# Patient Record
Sex: Male | Born: 1935 | Race: White | Hispanic: No | Smoking: Former smoker
Health system: Southern US, Community
[De-identification: ages and names within clinical notes are randomized; demographics above are authoritative.]

## PROBLEM LIST (undated history)

## (undated) DIAGNOSIS — D126 Benign neoplasm of colon, unspecified: Secondary | ICD-10-CM

## (undated) DIAGNOSIS — N200 Calculus of kidney: Secondary | ICD-10-CM

## (undated) DIAGNOSIS — D649 Anemia, unspecified: Secondary | ICD-10-CM

## (undated) DIAGNOSIS — R0602 Shortness of breath: Secondary | ICD-10-CM

## (undated) DIAGNOSIS — K8050 Calculus of bile duct without cholangitis or cholecystitis without obstruction: Secondary | ICD-10-CM

## (undated) DIAGNOSIS — I219 Acute myocardial infarction, unspecified: Secondary | ICD-10-CM

## (undated) DIAGNOSIS — E785 Hyperlipidemia, unspecified: Secondary | ICD-10-CM

## (undated) DIAGNOSIS — K222 Esophageal obstruction: Secondary | ICD-10-CM

## (undated) DIAGNOSIS — K219 Gastro-esophageal reflux disease without esophagitis: Secondary | ICD-10-CM

## (undated) DIAGNOSIS — I2510 Atherosclerotic heart disease of native coronary artery without angina pectoris: Secondary | ICD-10-CM

## (undated) DIAGNOSIS — Z5189 Encounter for other specified aftercare: Secondary | ICD-10-CM

## (undated) DIAGNOSIS — M810 Age-related osteoporosis without current pathological fracture: Secondary | ICD-10-CM

## (undated) DIAGNOSIS — K648 Other hemorrhoids: Secondary | ICD-10-CM

## (undated) DIAGNOSIS — F32A Depression, unspecified: Secondary | ICD-10-CM

## (undated) DIAGNOSIS — J189 Pneumonia, unspecified organism: Secondary | ICD-10-CM

## (undated) DIAGNOSIS — I10 Essential (primary) hypertension: Secondary | ICD-10-CM

## (undated) DIAGNOSIS — M1990 Unspecified osteoarthritis, unspecified site: Secondary | ICD-10-CM

## (undated) DIAGNOSIS — K279 Peptic ulcer, site unspecified, unspecified as acute or chronic, without hemorrhage or perforation: Secondary | ICD-10-CM

## (undated) DIAGNOSIS — F329 Major depressive disorder, single episode, unspecified: Secondary | ICD-10-CM

## (undated) DIAGNOSIS — K5730 Diverticulosis of large intestine without perforation or abscess without bleeding: Secondary | ICD-10-CM

## (undated) DIAGNOSIS — J329 Chronic sinusitis, unspecified: Secondary | ICD-10-CM

## (undated) DIAGNOSIS — I214 Non-ST elevation (NSTEMI) myocardial infarction: Secondary | ICD-10-CM

## (undated) HISTORY — DX: Diverticulosis of large intestine without perforation or abscess without bleeding: K57.30

## (undated) HISTORY — DX: Benign neoplasm of colon, unspecified: D12.6

## (undated) HISTORY — DX: Other hemorrhoids: K64.8

## (undated) HISTORY — DX: Calculus of bile duct without cholangitis or cholecystitis without obstruction: K80.50

## (undated) HISTORY — PX: DOPPLER ECHOCARDIOGRAPHY: SHX263

## (undated) HISTORY — DX: Depression, unspecified: F32.A

## (undated) HISTORY — DX: Chronic sinusitis, unspecified: J32.9

## (undated) HISTORY — DX: Hyperlipidemia, unspecified: E78.5

## (undated) HISTORY — DX: Peptic ulcer, site unspecified, unspecified as acute or chronic, without hemorrhage or perforation: K27.9

## (undated) HISTORY — DX: Anemia, unspecified: D64.9

## (undated) HISTORY — DX: Gastro-esophageal reflux disease without esophagitis: K21.9

## (undated) HISTORY — DX: Major depressive disorder, single episode, unspecified: F32.9

## (undated) HISTORY — DX: Encounter for other specified aftercare: Z51.89

## (undated) HISTORY — PX: OTHER SURGICAL HISTORY: SHX169

## (undated) HISTORY — PX: CATARACT EXTRACTION W/ INTRAOCULAR LENS  IMPLANT, BILATERAL: SHX1307

## (undated) HISTORY — DX: Esophageal obstruction: K22.2

## (undated) HISTORY — PX: UPPER GASTROINTESTINAL ENDOSCOPY: SHX188

## (undated) HISTORY — DX: Age-related osteoporosis without current pathological fracture: M81.0

---

## 1995-04-25 HISTORY — PX: CORONARY ANGIOPLASTY WITH STENT PLACEMENT: SHX49

## 1995-09-23 DIAGNOSIS — I219 Acute myocardial infarction, unspecified: Secondary | ICD-10-CM

## 1995-09-23 HISTORY — DX: Acute myocardial infarction, unspecified: I21.9

## 1998-05-18 ENCOUNTER — Encounter: Payer: Self-pay | Admitting: Emergency Medicine

## 1998-05-18 ENCOUNTER — Emergency Department (HOSPITAL_COMMUNITY): Admission: EM | Admit: 1998-05-18 | Discharge: 1998-05-18 | Payer: Self-pay | Admitting: Emergency Medicine

## 1999-10-16 ENCOUNTER — Emergency Department (HOSPITAL_COMMUNITY): Admission: EM | Admit: 1999-10-16 | Discharge: 1999-10-16 | Payer: Self-pay | Admitting: *Deleted

## 2004-09-12 ENCOUNTER — Ambulatory Visit: Payer: Self-pay | Admitting: Internal Medicine

## 2004-10-10 ENCOUNTER — Emergency Department (HOSPITAL_COMMUNITY): Admission: EM | Admit: 2004-10-10 | Discharge: 2004-10-10 | Payer: Self-pay | Admitting: Emergency Medicine

## 2004-10-12 ENCOUNTER — Encounter (INDEPENDENT_AMBULATORY_CARE_PROVIDER_SITE_OTHER): Payer: Self-pay | Admitting: Specialist

## 2004-10-12 ENCOUNTER — Ambulatory Visit: Payer: Self-pay | Admitting: Internal Medicine

## 2005-01-31 ENCOUNTER — Encounter: Payer: Self-pay | Admitting: Emergency Medicine

## 2005-02-01 ENCOUNTER — Inpatient Hospital Stay (HOSPITAL_COMMUNITY): Admission: AD | Admit: 2005-02-01 | Discharge: 2005-02-02 | Payer: Self-pay | Admitting: Obstetrics & Gynecology

## 2005-02-01 ENCOUNTER — Ambulatory Visit: Payer: Self-pay | Admitting: Gastroenterology

## 2005-02-03 ENCOUNTER — Ambulatory Visit: Payer: Self-pay | Admitting: Gastroenterology

## 2005-06-03 ENCOUNTER — Inpatient Hospital Stay (HOSPITAL_COMMUNITY): Admission: EM | Admit: 2005-06-03 | Discharge: 2005-06-04 | Payer: Self-pay | Admitting: *Deleted

## 2005-07-27 ENCOUNTER — Ambulatory Visit: Payer: Self-pay | Admitting: Internal Medicine

## 2005-08-02 ENCOUNTER — Ambulatory Visit: Payer: Self-pay | Admitting: Internal Medicine

## 2005-08-28 ENCOUNTER — Ambulatory Visit: Payer: Self-pay | Admitting: Internal Medicine

## 2006-03-02 DIAGNOSIS — I214 Non-ST elevation (NSTEMI) myocardial infarction: Secondary | ICD-10-CM

## 2006-03-02 HISTORY — DX: Non-ST elevation (NSTEMI) myocardial infarction: I21.4

## 2006-04-03 ENCOUNTER — Emergency Department (HOSPITAL_COMMUNITY): Admission: EM | Admit: 2006-04-03 | Discharge: 2006-04-03 | Payer: Self-pay | Admitting: Emergency Medicine

## 2006-12-03 ENCOUNTER — Emergency Department (HOSPITAL_COMMUNITY): Admission: EM | Admit: 2006-12-03 | Discharge: 2006-12-04 | Payer: Self-pay | Admitting: Emergency Medicine

## 2007-01-08 ENCOUNTER — Ambulatory Visit: Payer: Self-pay | Admitting: Internal Medicine

## 2007-07-12 DIAGNOSIS — Z9861 Coronary angioplasty status: Secondary | ICD-10-CM

## 2007-07-12 DIAGNOSIS — J301 Allergic rhinitis due to pollen: Secondary | ICD-10-CM

## 2007-07-12 DIAGNOSIS — K222 Esophageal obstruction: Secondary | ICD-10-CM

## 2007-07-12 DIAGNOSIS — K8050 Calculus of bile duct without cholangitis or cholecystitis without obstruction: Secondary | ICD-10-CM | POA: Insufficient documentation

## 2007-07-12 DIAGNOSIS — I2510 Atherosclerotic heart disease of native coronary artery without angina pectoris: Secondary | ICD-10-CM

## 2007-07-12 DIAGNOSIS — K648 Other hemorrhoids: Secondary | ICD-10-CM | POA: Insufficient documentation

## 2007-07-12 DIAGNOSIS — K5730 Diverticulosis of large intestine without perforation or abscess without bleeding: Secondary | ICD-10-CM | POA: Insufficient documentation

## 2007-07-12 DIAGNOSIS — J329 Chronic sinusitis, unspecified: Secondary | ICD-10-CM | POA: Insufficient documentation

## 2007-07-12 DIAGNOSIS — K219 Gastro-esophageal reflux disease without esophagitis: Secondary | ICD-10-CM | POA: Insufficient documentation

## 2007-07-12 DIAGNOSIS — E785 Hyperlipidemia, unspecified: Secondary | ICD-10-CM | POA: Insufficient documentation

## 2007-07-12 DIAGNOSIS — K208 Other esophagitis without bleeding: Secondary | ICD-10-CM | POA: Insufficient documentation

## 2007-07-12 DIAGNOSIS — K279 Peptic ulcer, site unspecified, unspecified as acute or chronic, without hemorrhage or perforation: Secondary | ICD-10-CM | POA: Insufficient documentation

## 2007-07-12 DIAGNOSIS — D126 Benign neoplasm of colon, unspecified: Secondary | ICD-10-CM | POA: Insufficient documentation

## 2009-04-24 HISTORY — PX: OTHER SURGICAL HISTORY: SHX169

## 2009-10-12 ENCOUNTER — Encounter (INDEPENDENT_AMBULATORY_CARE_PROVIDER_SITE_OTHER): Payer: Self-pay | Admitting: *Deleted

## 2010-05-15 ENCOUNTER — Encounter: Payer: Self-pay | Admitting: *Deleted

## 2010-05-26 NOTE — Letter (Signed)
Summary: Colonoscopy Letter  [facility]  [address] [place], [place] [address]   Phone: [phone]  Fax: [phone]      October 12, 2009 MRN: [id number]   [facility] [address] [place], [place]  [address]   Dear Mr. Torian,   According to your medical record, it is time for you to schedule a Colonoscopy. The American Cancer Society recommends this procedure as a method to detect early colon cancer. Patients with a family history of colon cancer, or a personal history of colon polyps or inflammatory bowel disease are at increased risk.  This letter has been generated based on the recommendations made at the time of your procedure. If you feel that in your particular situation this may no longer apply, please contact our office.  Please call our office at [phone] to schedule this appointment or to update your records at your earliest convenience.  Thank you for cooperating with [place] to provide you with the very best care possible.   Sincerely,  Wilhemina Bonito. Marina Goodell, M.D.  [facility] [phone]

## 2010-09-06 NOTE — Assessment & Plan Note (Signed)
[facility]                         GASTROENTEROLOGY OFFICE NOTE   NAME:PASCHALLZade, Falkner                     MRN:          [id number]  DATE:01/08/2007                            DOB:          1933/05/15    HISTORY:  Mr. Aguilera is a 77 year old gentleman with a history of  coronary artery disease, hyperlipidemia, peptic ulcer disease,  gastroesophageal reflux disease complicated by erosive esophagitis,  choledocholithiasis, status post ERCP with sphincterotomy and stone  extraction.  The patient presents today with ongoing chronic abdominal  complaints and requests medication refill.   He was last evaluated in the office on Aug 28, 2005, with intermittent  epigastric pain, felt secondary to gallbladder disease.  Against medical  advice, the patient has not sought surgical attention (i.e. a  cholecystectomy), for what is felt to be symptomatic gallbladder  disease.  He has been strictly advised on multiple occasions.  He has  also been advised as to the potential consequences of ignoring this  recommendation.  In any event, for his reflux disease, he has been on  Protonix.  He is currently on no medication, due to cost concerns.  He  denies dysphagia.  Finally, he continues with intermittent epigastric  pain which generally bothers him at 4 or 5 a.m.  For this he will take  sublingual Levsin.  Over time, he states this is helpful.  He has no  problems with vomiting, melena or hematochezia.  His last colonoscopy in  2006, revealed diminutive polyps and diverticulosis.   ALLERGIES:  NIASPAN.   CURRENT MEDICATIONS:  1. Metoprolol 100 mg daily.  2. Hydrochlorothiazide 12.5 mg daily.  3. Avapro 150 mg twice daily.  4. Vytorin 10/20 mg daily.  5. Aspirin 81 mg daily.  6. Fish oil one twice daily.   PHYSICAL EXAMINATION:  GENERAL:  An elderly male, in no acute distress.  VITAL SIGNS:  Blood pressure 130/78, heart rate 52 and regular, weight  207  pounds (increased 10 pounds).  HEENT:  Sclerae anicteric.  Conjunctivae pink.  Oral mucosa is intact.  NECK:  There is no adenopathy.  LUNGS:  Clear.  HEART:  Regular.  ABDOMEN:  Soft without tenderness, mass or hernia.  Good bowel sounds  heard.  EXTREMITIES:  Without edema.   IMPRESSION:  1. Gastroesophageal reflux disease.  Patient with symptoms off of      proton pump inhibitors.  2. History of choledocholithiasis, status post endoscopic retrograde      cholangiopancreatography with sphincterotomy.  Intact gallbladder,      as noted above.  3. Intermittent abdominal pain, felt likely due to gallbladder      disease.  The patient does take Levsin, which he feels helps.  4. History of colon polyps and diverticulosis on colonoscopy in 2006.  5. Multiple general medical problems, under the care of Dr. Bayard Beaver.      Spear.   RECOMMENDATIONS:  1. Prilosec over-the-counter 20 mg daily.  Multiple samples have been      provided.  The patient should find this a more cost-effective      alternative.  2. Levsin  sublingual p.r.n.  3. Again recommended to consult with surgery regarding a      cholecystectomy for biliary abdominal pain. He acknowledged such.  4. Surveillance colonoscopy planned for 2111.  5. Ongoing general medical care with Dr. Collins Scotland.     Wilhemina Bonito. Marina Goodell, MD  Electronically Signed    JNP/MedQ  DD: 01/08/2007  DT: 01/08/2007  Job #: [id number]   cc:   [name] R. Collins Scotland, M.D.  Nanetta Batty, M.D.

## 2010-09-09 NOTE — Discharge Summary (Signed)
NAME:  [facility], Kevin Rhodes              ACCOUNT NO.:  [id number]   MEDICAL RECORD NO.:  [id number]          PATIENT TYPE:  INP   LOCATION:  5506                         FACILITY:  [facility]   PHYSICIAN:  Mobolaji B. Bakare, M.D.DATE OF BIRTH:  Jul 13, 1933   DATE OF ADMISSION:  06/03/2005  DATE OF DISCHARGE:  06/04/2005                                 DISCHARGE SUMMARY   PRIMARY CARE PHYSICIAN:  Dr. Earlene Plater, [facility].   FINAL DIAGNOSES:  1.  Gastroenteritis.  2.  Hyperglycemia.  3.  Hyperbilirubinemia with admission bilirubin of 2.0. This improved to 1.6      at discharge. Normal liver enzymes.   SECONDARY DIAGNOSES:  1.  Acute renal failure with thrombocytopenia in October 2006.  2.  Coronary artery disease, status post percutaneous transluminal coronary      angioplasty and stent in 1997.  3.  Colon polyps, diverticulitis, and hemorrhoids by colonoscopy in June      2006.  4.  Esophagitis, jejunitis, and esophageal stricture on upper endoscopy in      June 2006.  5.  Hypertension.  6.  Hyperlipidemia.  7.  History of cholangitis, choledocholithiasis, status post ERCP and      removal of common bile duct stone in October 2006.   PROCEDURE:  CT scan of the abdomen and pelvis showed multiple small  nonobstructive right renal calculi, bilateral renal cysts, cardiomegaly,  mild bibasilar atelectasis, gastric distention which may be related to  gastroenteritis, small amount of biliary air compatible with history of  sphincterectomy.   BRIEF HISTORY:  Please refer to the admission H&P for full details. [name] is a 77 year old male African American male with multiple medical  problems as stated above. He presented to the emergency room with abdominal  pain, nausea, and vomiting, which started a day prior to admission. He was  feeling weak. He had no fever. CT scan of the abdomen is as noted above.  Thus, he needed to be admitted to hydration and resolution of  symptoms.   HOSPITAL COURSE:  Gastroenteritis. [facility] was started on IV fluids, IV  Phenergan p.r.n., and Dilaudid for pain. Abdominal CT scan did not show any  acute abnormality. He had a stool culture which was negative, there was no  growth. C. difficile was negative. Ova and parasite not present. Urine  culture was no growth. During the course of hospitalization he remained  afebrile. Symptoms resolved in 36 hours. Hence, the patient was deemed fit  to be discharged home.   DISCHARGE CONDITION:  Stable.   DISCHARGE MEDICATIONS:  1.  Vytorin 10/20 p.o. daily.  2.  Hydrochlorothiazide 25 mg daily.  3.  Metoprolol 100 m g daily.  4.  Avapro 150 mg b.i.d.  5.  Protonix 40 mg daily.   FOLLOWUP:  The patient will follow up with primary care physician.      Mobolaji B. Corky Downs, M.D.  Electronically Signed     MBB/MEDQ  D:  06/16/2005  T:  06/18/2005  Job:  [id number]   cc:   Dr. Earlene Plater  [facility]

## 2010-09-09 NOTE — Discharge Summary (Signed)
[name], DUPIN NO.:  [id number]   MEDICAL RECORD NO.:  [id number]          PATIENT TYPE:  INP   LOCATION:  5511                         FACILITY:  [facility]   PHYSICIAN:  Ilda Mori, M.D.   DATE OF BIRTH:  [birth year]   DATE OF ADMISSION:  02/01/2005  DATE OF DISCHARGE:  02/02/2005                                 DISCHARGE SUMMARY   NOTE:  Please send a copy of the discharge summary which was dictated,  #[id number], on Marcoantonio L. Putt to Lebron Conners, M.D.      Jennye Moccasin, P.A. [facility]      Ilda Mori, M.D.  Electronically Signed    SG/MEDQ  D:  02/02/2005  T:  02/03/2005  Job:  [id number]

## 2010-09-09 NOTE — Consult Note (Signed)
NAMECHE, RACHAL NO.:  [id number]   MEDICAL RECORD NO.:  [id number]          PATIENT TYPE:  INP   LOCATION:  5511                         FACILITY:  [facility]   PHYSICIAN:  Lebron Conners, M.D.   DATE OF BIRTH:  [birth year]   DATE OF CONSULTATION:  02/01/2005  DATE OF DISCHARGE:                                   CONSULTATION   REASON FOR CONSULTATION:  Cholangitis, needs cholecystectomy.   HISTORY OF PRESENT ILLNESS:  Mr. Crossen is a 77 year old male patient who  has been sick over the past 5-6 days complaining of nausea, vomiting, and  mild periumbilical pain.  He presented to the emergency room on February 01, 2005, and was noted to have elevated liver function tests as well as total  bilirubin at 3.7.  Blood cultures were obtained and are currently not final.  Initially one blood culture was obtained in the emergency room that shows  some gram negative rods, but since that time, two blood cultures have been  drawn and they so far have not shown any growth.  Urinalysis showed some  bilirubin as well as trace leukocytes, with proteinuria.  His white blood  cell count is normal, although he has been running a fever as high as 103.   A gallbladder ultrasound showed a stone, 9 mm, lodged in the gallbladder  neck, but no evidence of cholecystitis.  The patient then underwent ERCP  today that did show ductal dilatation from the common bile duct to the  common hepatic duct.  There was a round, nonmobile stone in the common  hepatic duct, 12 mm, that was extracted without difficulty.  The patient has  undergone ERCP without difficulty.  He is in the holding area now.  We have  been consulted for cholecystectomy in the near future.   PAST MEDICAL HISTORY:  1.  Coronary artery disease, apparently had a stent placed in 1997, but no      problems in the past approximately 10 years.  2.  History of hypertension.  3.  Hypercholesterolemia.  4.  GERD.   SOCIAL  HISTORY:  Married, lives in [place].   ALLERGIES:  No known drug allergies.   MEDICATIONS:  IV Cipro, hydrochlorothiazide, Avapro, metoprolol, Flagyl, and  Protonix.   FAMILY HISTORY:  Unknown.  There is no family present and indications of  family history in his e-chart.   PHYSICAL EXAMINATION:  VITAL SIGNS:  T-max 102.8, pulse 66, respirations 20,  blood pressure 93/59, O2 saturations 95% on room air.  HEENT:  Normal.  No nasal discharge.  NECK:  There is no thyromegaly.  CHEST:  Clear to auscultation bilaterally.  HEART:  Regular rate and rhythm.  No gross murmur.  ABDOMEN:  Obese.  Right upper quadrant is tender but abdomen is nonrigid.  EXTREMITIES:  No peripheral edema.  SKIN:  Warm and dry.  NEUROLOGIC:  Intact.   IMPRESSION:  1.  Cholangitis.  Will keep patient n.p.o. after midnight for tentative      cholecystectomy tomorrow.  We will recheck LFTs in the morning.  The  patient has been seen and examined by Dr. Orson Slick and will reevaluate the      patient in the morning to decide upon surgical timing.  2.  Hypertension.  3.  Known coronary artery disease.  4.  Hypercholesterolemia.  5.  Gastroesophageal reflux disease.      Guy Franco, P.A.      ______________________________  Lebron Conners, M.D.    LB/MEDQ  D:  02/01/2005  T:  02/01/2005  Job:  [id number]

## 2010-09-09 NOTE — Discharge Summary (Signed)
NAME:  Kevin Rhodes, Kevin Rhodes              ACCOUNT NO.:  [id number]   MEDICAL RECORD NO.:  [id number]          PATIENT TYPE:  INP   LOCATION:  5511                         FACILITY:  [facility]   PHYSICIAN:  Kevin [facility], M.D. Kevin Rhodes OF BIRTH:  August 12, 1933   DATE OF ADMISSION:  02/01/2005  DATE OF DISCHARGE:  02/02/2005                                 DISCHARGE SUMMARY   ADMISSION DIAGNOSES:  1.  Cholelithiasis, probable cholangitis, rule out choledocholithiasis.  2.  Coronary artery disease.  Stent placed in 1997.  3.  Thrombocytopenia.  4.  Hypokalemia.  5.  Renal insufficiency.  6.  Hyperglycemia.  7.  History of hypertension.  8.  History of dyslipidemia.  9.  Remote peptic ulcer disease when he was in his 3s.  10. Colon polyps, diverticulosis and hemorrhoids on colonoscopy of June 2006      by [facility]. [facility], M.D.  Pathology of the polyps not known at this time      and not available in E-chart.  11. Esophagitis, duodenitis, esophageal stricture consistent with      gastroesophageal reflux disease as well as antral polyp which was      biopsied by [facility] in June 2006.  Again, pathology not available      on E-chart.  12. No known drug allergies.   DISCHARGE DIAGNOSES:  1.  Choledocholithiasis.  2.  Status post endoscopic retrograde cholangiopancreatography with removal      of common bile duct stone.  3.  Jaundice secondary to choledocholithiasis, improved following endoscopic      retrograde cholangiopancreatography.  4.  Cholangitis.  Did have fevers during this admission, treated with      antibiotics and will go home on oral antibiotics.  Gram-negative rods      seen on Gram stain.  Final identification of flora still pending at the      time of this dictation.  5.  Cholelithiasis.  The patient has opted for postponement of laparoscopic      cholecystectomy, and this will be pursued in the next couple of weeks by      [facility].  6.  Hypokalemia, corrected.  7.   Hyperglycemia.  Needs to be monitored for emergence of diabetes mellitus      type 2.  8.  Acute renal insufficiency with prerenal azotemia secondary to acute      illness involving depletion.  This resolved during hospitalization.   PROCEDURES:  Endoscopic retrograde cholangiopancreatography with  sphincterotomy and removal of large stone in the common bile duct.  Diffuse  common bile duct dilatation noted.   CONSULTATIONS:  General surgical evaluation by [facility], M.D.   BRIEF HISTORY:  [name] is a pleasant, generally healthy 77 year old  gentleman.  He is still quite active.  He was known to [facility] because of  having undergone recent screening colonoscopy and endoscopy in June of this  year.  His primary [facility] doctor is at [facility].  Several days  prior to presenting for this admission, he developed nausea and vomiting.  This lasted Friday through Sunday.  It subsided and he was left with  anorexia and weakness.  He was not having any abdominal pain but did have  some chills.  He went to the urgent [facility] and they thought he might be  in the throes of a viral gastrointestinal illness.  The patient says he was  given a B12 shot and also received prescriptions for dicyclomine and  promethazine.  He went home but in the next several hours he still felt very  unwell and got to the point where he could not even walk without assistance.  He went to the emergency room at [facility], where he ultimately was seen  by Dr. Melvia Rhodes.  His pulse was tachycardic at 106, blood pressure  103/68, temperature was 103.  The ultrasound of the abdomen showed a stone  in the neck of the gallbladder and a prominent bile duct at 8 mm diameter.  [facility] was concerned that the patient may have cholangitis and admitted  him and started him on Cipro and IV fluids.  The patient's transaminases  were elevated.   LABORATORY DATA:  Initial total bilirubin 3.7.  It was  2.6 at discharge.  Alkaline phosphatase 168 initial, 135 at discharge.  AST 113 initial, 65 at  discharge.  ALT 11 initial, 76 at discharge.  White blood cell count was  7.5.  Hemoglobin went from 13.3 to 11.9.  Hematocrit was 34.6 at discharge.  MCV 85.3.  Platelets initially 118, 121 at discharge.  PT 16.5, INR 1.3, PTT  34.  Sodium 137, potassium 4.3, glucose 11, chloride 107, CO2 of 19.  BUN  went from 29 to 19.  Creatinine went from 1.7 to 1.4.  Albumin 3.  Gram  stain did show gram-negative rods.  Final ID is pending.  This was on one of  four blood cultures drawn.   HOSPITAL COURSE:  The patient was admitted, hydrated and started on IV  fluids.  He was kept n.p.o. initially.  On October 11 he underwent an ERCP  with removal of the common bile duct stone.  Before that procedure the  patient spiked temperatures as high as 102.8.  At that point IV Flagyl was  added to the IV ciprofloxacin.  He defervesced for the rest of the afternoon  of October 11 and through the morning of October 12.  However, just prior to  discharge he did have a temperature reading of 100.1.  It was felt that he  would be okay for discharge because he did have a prescription for  ciprofloxacin and clinically was doing quite well.  He was advised to get a  thermometer and take his temperature whenever he felt that he was feverish  and to take it at least once a day.  If he had persistent fevers, he was to  call [facility] office.   The patient was seen by [facility] for evaluation for laparoscopic  cholecystectomy.  This was set up for the morning of October 12.  However,  on second consideration the patient requested of [facility] that the surgery  be postponed.  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  [facility] felt that this would not be a problem.   The patient was in stable condition and  eating a low-fat diet on the day  following his ERCP.  His LFTs were rechecked  that morning, and they were all  declining nicely.  His renal insufficiency had resolved.  He was discharged  to home with a prescription for 10 days' worth of ciprofloxacin.  He was to  continue all his outpatient medications with the exception of the 81 mg of  aspirin.  This was not to be restarted until after [facility] said it would  be okay to do so.  We also asked the patient to hold his hydrochlorothiazide  for the next few days and restart it on February 05, 2005, in order to assure  that his kidneys were well-hydrated before restarting this medications.   CONDITION ON DISCHARGE:  Stable.   DIET AT DISCHARGE:  Low-fat.   MEDICATIONS AT DISCHARGE:  1.  Vytorin 10/20 mg once daily.  2.  Hydrochlorothiazide 25 mg once daily to restart on October 15.  3.  Metoprolol 100 mg once daily.  4.  Avapro 150 mg twice daily.  5.  Protonix 40 mg once daily.  6.  Ciprofloxacin 500 mg one p.o. twice daily for 10 days.      Jennye Moccasin, P.A. [facility]      [facility], M.D. [facility]  Electronically Signed    SG/MEDQ  D:  02/02/2005  T:  02/03/2005  Job:  [id number]   cc:   [facility]. [facility], M.D. [facility]  [address]  [place]  [place] [address]

## 2010-09-09 NOTE — H&P (Signed)
NAME:  Kevin Rhodes, SOULE NO.:  [id number]   MEDICAL RECORD NO.:  [id number]          PATIENT TYPE:  INP   LOCATION:  1824                         FACILITY:  [facility]   PHYSICIAN:  Elliot Cousin, M.D.    DATE OF BIRTH:  12/11/1933   DATE OF ADMISSION:  06/03/2005  DATE OF DISCHARGE:                                HISTORY & PHYSICAL   CHIEF COMPLAINT:  Abdominal pain, nausea and vomiting.   HISTORY OF PRESENT ILLNESS:  The patient is a 77 year old man with a past  medical history significant for choledocholithiasis, cholangitis, status  post ERCP with removal of a common bile duct stone in October 2006, who  presents to the emergency department with a chief complaint of abdominal  pain, nausea and vomiting.  The patient's symptoms started approximately 18  hours ago.  The abdominal pain is described as achy.  The pain has been  mild to moderate in intensity.  After he vomits, the pain subsides.  The  pain does not radiate to the groin or to the back.  It is located primarily  below his umbilicus.  He has had no associated diarrhea.  His last bowel  movement was last night.  No recent melena or hematochezia.  He has had at  least 7-8 episodes of vomiting over the past 18 hours.  He describes the  emesis as sometimes green in color and sometimes brown in color.  No coffee  ground consistency.  No bright red blood in the emesis.  He has had chills  once but no fever.  No rash.  No yellowing of the eyes or skin.  No pain  with urination.  Last night, prior to his symptoms, he ate pie with whipped  cream, baked potato, chocolate ice cream, and peanut butter crackers.  Neither his wife nor his daughter ate the same foods last night.  Neither  his wife nor his daughter are sick.  The patient was recently treated with  Meclizine prescribed by his primary care physician for dizziness two days  ago.  The patient has not been treated with any antibiotics recently.  No  recent  travel.   During the evaluation in the emergency department, the patient is noted to  be afebrile and tachycardic.  His blood pressure is 149/98.  His oxygen  saturations are well within normal limits.  Lab data are significant for an  elevated white blood cell count of 12.3, an elevated glucose of 174, an  elevated total bilirubin of 2, and a normal lipase of 39.  The patient will  be admitted for further evaluation and management.   PAST MEDICAL HISTORY:  1.  Choledocholithiasis with cholangitis in October 2006 status post ERCP      with removal of a common bile duct stone.  2.  Postponement of laparoscopic cholecystectomy.  The patient was scheduled      to follow up with surgeon, Dr. Lebron Conners, in November 2006.      However, the patient did not follow up because his symptoms completely      resolved.  3.  Hypokalemia, acute renal insufficiency, and thrombocytopenia during the      hospitalization in October 2006.  All corrected prior to hospital      discharge.  4.  Coronary artery disease status post PTCA and stent in 1997.  5.  Colon polyps, diverticulosis, and hemorrhoids, per colonoscopy in June      2006 by Dr. Yancey Flemings.  Pathology of the polyps unknown at this time.  6.  Esophagitis, duodenitis, esophageal stricture consistent with      gastroesophageal reflux disease as well as an antral polyp which was      biopsied by [name] in June 2006 per EGD.  Pathology report not      available.  7.  Hypertension.  8.  Hyperlipidemia.   MEDICATIONS:  1.  Vytorin 10/20 mg daily.  2.  Hydrochlorothiazide 25 mg, 1/2 tablet daily.  3.  Protonix 40 mg daily.  4.  Metoprolol 100 mg daily.  5.  Meclizine 25 mg 1/2 tablet t.i.d. p.r.n.   ALLERGIES:  No known drug allergies.   SOCIAL HISTORY:  The patient is married, he [place]  He has three children.  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  He  denies tobacco,  alcohol, and illicit drug use.  He is fairly independent.   FAMILY HISTORY:  His mother died of old age at 63 years of age.  He does not  know his father.   REVIEW OF SYMPTOMS:  The patient's review of systems is negative with the  exception of occasional bilateral leg pain.   PHYSICAL EXAMINATION:  VITAL SIGNS:  Temperature 98.7, blood pressure 149/98, pulse 130, repeated  at 111, respiratory rate 20, oxygen saturation 94% on room air.  GENERAL:  The patient is a pleasant, alert, mildly overweight, 77 year old  Caucasian man who is currently lying in bed in no acute distress.  HEENT:  Head is normocephalic, atraumatic.  Pupils equal, round, reactive to  light.  Extraocular movements intact.  Conjunctivae clear.  Sclerae white.  Tympanic membrane on the left is obscured by cerumen, tympanic membrane on  the right is clear.  Nasal mucosa is moist, no sinus tenderness.  Oropharynx  reveals mildly dry mucous membranes.  No posterior exudates or erythema.  NECK:  Supple, no adenopathy, no thyromegaly, no JVD, no bruits.  LUNGS:  Clear to auscultation bilaterally.  HEART:  S1 and S2 with mild tachycardia.  ABDOMEN:  Obese, positive bowel sounds, soft, mildly tender in the  hypogastric region, no distention, no rebound, no guarding, no masses  palpated.  RECTAL AND GU:  Deferred.  EXTREMITIES:  Pedal pulses are 2+ bilaterally.  No pretibial edema and no  pedal edema.  NEUROLOGICAL:  The patient is alert and oriented x 3.  Cranial nerves 2-12  are intact.  Strength 5/5 throughout.  Sensation is intact throughout.  Gait  is within normal limits.   ADMISSION LABORATORY DATA:  EKG reveals sinus tachycardia with occasional  PVC, Q waves in the lateral and inferior leads, unchanged compared with  previous EKG with the exception of tachycardia.  Lipase 39.  WBC 12.3,  hemoglobin 13.5, hematocrit 41.5, MCV 81.8, platelets 187, myoglobin 177. CK MB 1.8, troponin I less than  0.05.  Sodium 141, potassium 4.2, chloride  11, CO2 23, glucose 174, BUN 19, creatinine 1.5, calcium 9, total protein  7.3, albumin 3.9, AST 39, ALT 26, alkaline phos 78, total  bilirubin 2.   ASSESSMENT:  1.  Abdominal pain with nausea and vomiting.  The differential diagnoses      include biliary colic, UTI, acute cholecystitis, viral gastroenteritis,      peptic ulcer disease, esophagitis, diverticulitis, etc.  2.  Leukocytosis.  The patient's white blood cell count is mildly elevated      at 12.3.  The elevation could be an active component of the nausea,      vomiting, and abdominal pain.  The patient is currently afebrile and      other than a possible viral gastroenteritis, UTI, and/or cholecystitis,      no other infection is evident.  3.  History of choledocholithiasis and cholangitis.  The patient is status      post ERCP in October 2006.  Given that he is afebrile and with no acute      right upper quadrant abdominal pain, I doubt that the patient has      recurrent cholangitis.  He does have hyperbilirubinemia with a bilirubin      of 2, however.  4.  Hyperglycemia.  The patient gives no history of diabetes mellitus.  His      venous glucose is 174.  5.  Recent dizziness started on Meclizine by his primary care physician.      The patient denies spinning dizziness, rather, he has had intermittent      light headedness.  No complaints of dizziness today.  6.  Coronary artery disease.  He denies chest pain.  He has been without      cardiac symptoms for sometime now.  7.  Hypertension.  The patient is chronically treated with      hydrochlorothiazide and Metoprolol.  His blood pressure is mildly      elevated.   PLAN:  1.  The patient will be admitted for further evaluation and management.  2.  Will assess the patient with a CT scan of the abdomen and pelvis with      contrast.  3.  Insert NG tube if needed. The patient wants to avoid the NG, but this      may not be  possible.  4.  The patient will be made n.p.o.  Will start Reglan 5 mg IV q.6h. and      p.r.n. Phenergan and Zofran. Protonix will be given IV.  5.  Volume repletion with normal saline.  The patient was given 1 liter of      normal saline by the emergency department physician.  Will add potassium      chloride and an amp of multi-vitamin to each liter of IV fluid.  6.  Will check a hemoglobin A1C and cover anticipated elevated blood sugars      with a sliding scale insulin regimen.  Dextrose will be added to the IV      fluids.  7.  Will check stool specimens for C. diff, O&P, routine gram stain, and      C&S.  The patient, however, does not have diarrhea at this time.  8.  Will check an urinalysis to rule out infection.  9.  Will treat the patient's hypertension with intravenous Lopressor 5 mg IV      q.4h.  10. Consider consultation with gastroenterologist, [name], and surgeon,      Dr. Orson Slick.      Elliot Cousin, M.D.  Electronically Signed    DF/MEDQ  D:  06/03/2005  T:  06/03/2005  Job:  [id number]   cc:   Wilhemina Bonito. Marina Goodell, M.D. [facility]  [address]  [place]  [place] [address]   Lebron Conners, M.D.  [address]  [place]  [place] [address]

## 2010-09-09 NOTE — H&P (Signed)
NAMEHUSAM, HOHN              ACCOUNT NO.:  [id number]   MEDICAL RECORD NO.:  [id number]          PATIENT TYPE:  INP   LOCATION:  5511                         FACILITY:  [facility]   PHYSICIAN:  Barbette Hair. Arlyce Dice, M.D. [facility] OF BIRTH:  December 18, 1933   DATE OF ADMISSION:  02/01/2005  DATE OF DISCHARGE:                                HISTORY & PHYSICAL   CHIEF COMPLAINT:  Anorexia and weakness with nausea and vomiting a few days  ago.   HISTORY OF PRESENT ILLNESS:  Mr. Koy is a pleasant 77 year old  gentleman who does not have any disabling medical problems.  Last Friday,  the patient developed severe nausea and vomiting, along with some suprapubic  pain.  This nausea and vomiting lasted Friday through "Sunday.  It subsided,  and since then he has been left with anorexia and progressive weakness.  He  denies right upper quadrant pain.  He has had some mild chills, but has not  taken his temperature.  He got so weak that he has not been able to walk  without assistance.  He went to [facility], his primary care  Jaysen Wey.  His primary care Lyndel Sarate prescribed dicyclomine and  promethazine.  He also gave him a vitamin B-12 shot.  He felt that the  patient may have some sore of a viral gastrointestinal illness.  However,  the patient's symptoms failed to improve over the next several hours, and,  therefore, he presented himself to the emergency room at [place].  There, he was evaluated by the emergency room physician, and then by Dr.  Robert Kaplan.  Temperature initial in the emergency room was 103 with a  blood pressure of 103/68 and tachycardic pulse at 106.  Saturations were  adequate in the mid 90s.  Labs revealed a cholestatic jaundice.  His  transaminases were elevated.  White cell count was not elevated.  Potassium  was low.  An ultrasound of the abdomen showed a stone in the neck of the  gallbladder.  The common bile duct was prominent at 8 mm diameter.  Dr.  Kaplan was concerned that the patient had cholangitis.  The patient was  admitted and started on intravenous ciprofloxacin, along with analgesics and  anti-emetics as needed.  He was also continued on IV fluid hydration.  Also  note that his BUN and creatinine were slightly elevated.   PAST MEDICAL HISTORY:  1.  Coronary artery disease, for which he has undergone stent placement in      19" 97.  He has not had any angina or required any subsequent      catheterizations since then.  2.  Hypertension.  3.  Dyslipidemia.  4.  Remote peptic ulcer disease during his 82s.  5.  History of colon polyps, diverticulosis, and hemorrhoids on colonoscopy      in June of 2006 by Dr. Yancey Flemings.  Pathology on the polyps is not      available at this time.  6.  Esophagitis, duodenitis, esophageal stricture consistent with      gastroesophageal reflux disease on upper endoscopy in June of  2006.      Also, an antral polyp was biopsied at that time.  Again, pathology is      not available at this time.   ALLERGIES:  None.  Specifically, no medication allergies and no latex  allergies.   CURRENT MEDICATIONS:  1.  Aspirin 81 mg daily.  2.  Vytorin 10/20 mg once daily.  3.  Hydrochlorothiazide 25 mg once daily.  4.  Metoprolol 100 mg once daily.  5.  Avapro 150 mg twice daily.  6.  Protonix 40 mg daily.   SOCIAL HISTORY:  The patient lives in [place].  He lives with his  spouse.  He does not drink alcohol or smoke or chew tobacco.   FAMILY HISTORY:  Noncontributory.   REVIEW OF SYSTEMS:  The patient has been urinating well.  He has not noticed  any darkening of his urine.  He is thirsty, but has not had excessive  thirst.  Some chills, but no documented fevers at home.  No unusual bleeding  or bruising.  No extremity edema or chest pain.  No palpitations.  No cough.  Some fatigue with walking, and this is something of a shortness of breath,  along with the fatigue.   PHYSICAL EXAMINATION:   VITAL SIGNS:  Blood pressure following some IV fluids  was 99/62, pulse 73, respirations 18, and temperature 98.  Initially, when  he came to the emergency room, temperature was 103, blood pressure 103/68,  pulse of 106, and respirations of 20.  Saturations were between 95% and 97%.  HEENT:  Sclerae are nonicteric.  Conjunctivae are pink.  Extraocular  movements intact.  Oropharynx is moist and clear.  NECK:  No JVD, no masses, no thyromegaly.  CHEST:  Clear to auscultation and percussion bilaterally with good breath  sounds.  No cough, no dyspnea with speech.  COR:  Regular rate and rhythm.  No murmurs, rubs, or gallops.  ABDOMEN:  Soft, nontender.  Bowel sounds are active.  No hepatosplenomegaly,  no masses, no bruits, no hernias.  RECTAL/GU:  Deferred.  EXTREMITIES:  No clubbing, cyanosis, or edema.  MUSCULOSKELETAL:  No atrophy of the upper or lower extremities.  HEMATOLOGIC:  No significant purpura or bruising.  DERMATOLOGIC:  No rash.  No worrisome lesions on the trunk or extremities.  PSYCHIATRIC:  The patient is pleasant and appropriate.  He is not anxiety.  NEUROLOGIC:  The patient is alert and oriented x3.  He provides an excellent  history with no signs of any memory impairment.  Moves all 4 extremities  without difficulty.  Neurologic exam is grossly nonfocal.   LABORATORY DATA:  Total bilirubin 3.6, alkaline phosphatase 167, AST 146,  ALT 121, albumin 3.2.  Sodium 130, potassium 3.0, BUN 28, creatinine 1.8,  glucose 155.  White blood cell count 7.5, hemoglobin 13.3, hematocrit 39.4,  platelets 118,000, MCV of 85.   IMPRESSION:  1.  Cholelithiasis with probably cholangitis.  Range of motion      choledocholithiasis.  2.  Coronary artery disease with stent placement in 1997 and no indications      that he has any unstable coronary disease at this point.  3.  Thrombocytopenia.  4.  Hypokalemia.  5.  Renal insufficiency.  6.  Hyperglycemia.  PLAN:  The patient is the  continue on the antibiotic of Cipro begun in the  emergency room.  Supportive care with analgesics and anti-emetics.  Continue  IV fluid hydration.  Plan ERCP investigation on February 01, 2005.  Will also  plan to discontinue hydrochlorothiazide because of the renal insufficiency.  We may be able to restart this later on.  Will follow up BMET and LFT's on a  daily basis, along with a CBC.      Jennye Moccasin, P.A. [facility]      Robert D. Arlyce Dice, M.D. [facility]  Electronically Signed    SG/MEDQ  D:  02/01/2005  T:  02/01/2005  Job:  [id number]

## 2011-01-06 ENCOUNTER — Telehealth: Payer: Self-pay | Admitting: *Deleted

## 2011-01-11 ENCOUNTER — Other Ambulatory Visit: Payer: Self-pay | Admitting: Cardiovascular Disease

## 2011-01-11 ENCOUNTER — Ambulatory Visit
Admission: RE | Admit: 2011-01-11 | Discharge: 2011-01-11 | Disposition: A | Discharge status: Home / Self Care | Payer: Medicare Other | Source: Ambulatory Visit | Attending: Cardiovascular Disease | Admitting: Cardiovascular Disease

## 2011-01-11 DIAGNOSIS — R0602 Shortness of breath: Secondary | ICD-10-CM

## 2011-01-17 ENCOUNTER — Telehealth: Payer: Self-pay | Admitting: Internal Medicine

## 2011-01-17 NOTE — Telephone Encounter (Signed)
Pt last seen for an OV 01/08/07, history of GERD. Colon and EGD done in 2006. Has recall colon scheduled for 02/16/11.  Pts daughter states that her father was supposed to have a cardiac cath last week with Dr. Allyson Sabal but his Hgb was 8.8 and was to low. Pt had CBC redrawn yesterday and his Hgb was 9.3, he is scheduled for cardiac cath tomorrow. Daughter is calling wanting to see if [name] thinks it is ok for him to have the cardiac cath. [facility] requesting labs from yesterday and some information. No calls have come over regarding this pt.

## 2011-01-17 NOTE — Telephone Encounter (Signed)
Please pull his chart for my review

## 2011-01-17 NOTE — Telephone Encounter (Signed)
Spoke with [facility] cardiology, they are going to call the pts daughter and let her know per Dr. Allyson Sabal it is ok to proceed with the cath. [facility] just wanted [place] to know about his labs because he has a colon scheduled with [name] 02/16/11.

## 2011-01-17 NOTE — Telephone Encounter (Signed)
Per [name] pt to proceed with his cath and then have an OV here. Pt scheduled to see [name] 02/01/11@8 :30am. Daughter aware of appt date and time. Daughter to bring records from Dr. Hazle Coca office.

## 2011-01-18 ENCOUNTER — Inpatient Hospital Stay (HOSPITAL_COMMUNITY)
Admission: RE | Admit: 2011-01-18 | Discharge: 2011-01-20 | DRG: 247 | Disposition: A | Discharge status: Home / Self Care | Payer: Medicare Other | Source: Ambulatory Visit | Attending: Cardiovascular Disease | Admitting: Cardiovascular Disease

## 2011-01-18 DIAGNOSIS — I200 Unstable angina: Secondary | ICD-10-CM | POA: Diagnosis present

## 2011-01-18 DIAGNOSIS — K219 Gastro-esophageal reflux disease without esophagitis: Secondary | ICD-10-CM | POA: Diagnosis present

## 2011-01-18 DIAGNOSIS — I2510 Atherosclerotic heart disease of native coronary artery without angina pectoris: Principal | ICD-10-CM | POA: Diagnosis present

## 2011-01-18 DIAGNOSIS — K2970 Gastritis, unspecified, without bleeding: Secondary | ICD-10-CM | POA: Diagnosis present

## 2011-01-18 DIAGNOSIS — D649 Anemia, unspecified: Secondary | ICD-10-CM | POA: Diagnosis present

## 2011-01-18 DIAGNOSIS — Z9861 Coronary angioplasty status: Secondary | ICD-10-CM

## 2011-01-18 DIAGNOSIS — E119 Type 2 diabetes mellitus without complications: Secondary | ICD-10-CM | POA: Diagnosis present

## 2011-01-18 DIAGNOSIS — E785 Hyperlipidemia, unspecified: Secondary | ICD-10-CM | POA: Diagnosis present

## 2011-01-18 DIAGNOSIS — Z79899 Other long term (current) drug therapy: Secondary | ICD-10-CM

## 2011-01-18 DIAGNOSIS — I10 Essential (primary) hypertension: Secondary | ICD-10-CM | POA: Diagnosis present

## 2011-01-18 DIAGNOSIS — Z7982 Long term (current) use of aspirin: Secondary | ICD-10-CM

## 2011-01-18 DIAGNOSIS — I252 Old myocardial infarction: Secondary | ICD-10-CM

## 2011-01-18 LAB — GLUCOSE, CAPILLARY

## 2011-01-18 LAB — POCT ACTIVATED CLOTTING TIME: Activated Clotting Time: 287 seconds

## 2011-01-18 NOTE — Telephone Encounter (Signed)
Not sure what this note if about.  Nothing in documentation.

## 2011-01-19 LAB — BASIC METABOLIC PANEL
Calcium: 8.7 mg/dL (ref 8.4–10.5)
GFR calc Af Amer: >60 mL/min (ref >60)
GFR calc non Af Amer: 60 mL/min — ABNORMAL LOW (ref >60)
Glucose, Bld: 148 mg/dL — ABNORMAL HIGH (ref 70–99)
Potassium: 4.7 mEq/L (ref 3.5–5.1)
Sodium: 138 mEq/L (ref 135–145)

## 2011-01-19 LAB — CBC
Hemoglobin: 8.9 g/dL — ABNORMAL LOW (ref 13.0–17.0)
RBC: 3.97 MIL/uL — ABNORMAL LOW (ref 4.22–5.81)

## 2011-01-19 LAB — GLUCOSE, CAPILLARY
Glucose-Capillary: 134 mg/dL — ABNORMAL HIGH (ref 70–99)
Glucose-Capillary: 119 mg/dL — ABNORMAL HIGH (ref 70–99)
Glucose-Capillary: 119 mg/dL — ABNORMAL HIGH (ref 70–99)
Glucose-Capillary: 241 mg/dL — ABNORMAL HIGH (ref 70–99)

## 2011-01-20 HISTORY — PX: CORONARY ANGIOPLASTY WITH STENT PLACEMENT: SHX49

## 2011-01-20 LAB — CBC
HCT: 28.3 % — ABNORMAL LOW (ref 39.0–52.0)
Hemoglobin: 8.4 g/dL — ABNORMAL LOW (ref 13.0–17.0)
MCH: 22.3 pg — ABNORMAL LOW (ref 26.0–34.0)
MCHC: 29.7 g/dL — ABNORMAL LOW (ref 30.0–36.0)
MCV: 75.3 fL — ABNORMAL LOW (ref 78.0–100.0)
Platelets: 159 10*3/uL (ref 150–400)
RBC: 3.76 MIL/uL — ABNORMAL LOW (ref 4.22–5.81)
RDW: 18 % — ABNORMAL HIGH (ref 11.5–15.5)
WBC: 6.3 10*3/uL (ref 4.0–10.5)

## 2011-01-20 LAB — BASIC METABOLIC PANEL
BUN: 22 mg/dL (ref 6–23)
Calcium: 8.4 mg/dL (ref 8.4–10.5)
Creatinine, Ser: 1.11 mg/dL (ref 0.50–1.35)
GFR calc Af Amer: >60 mL/min (ref >60)
GFR calc non Af Amer: >60 mL/min (ref >60)
Potassium: 5 mEq/L (ref 3.5–5.1)
Sodium: 137 mEq/L (ref 135–145)

## 2011-01-20 LAB — GLUCOSE, CAPILLARY: Glucose-Capillary: 152 mg/dL — ABNORMAL HIGH (ref 70–99)

## 2011-01-22 NOTE — Discharge Summary (Signed)
  Kevin Rhodes, Kevin Rhodes NO.:  [id number]  MEDICAL RECORD NO.:  [id number]  LOCATION:  2508                         FACILITY:  [facility]  PHYSICIAN:  Nanetta Batty, M.D.   DATE OF BIRTH:  [birth year]  DATE OF ADMISSION:  01/18/2011 DATE OF DISCHARGE:  01/20/2011                              DISCHARGE SUMMARY   DISCHARGE DIAGNOSES: 1. Unstable angina. 2. Coronary disease, right coronary artery drug-eluting stent     placement, January 18, 2011 with left anterior descending     percutaneous transluminal angioplasty, January 19, 2011. 3. Mild left ventricular dysfunction with an ejection fraction of 45-     50% at catheterization. 4. Treated hypertension. 5. Treated dyslipidemia. 6. Anemia, hemoglobin is 8.4 at discharge, the patient was noted to be     anemic with a hemoglobin of 9 prior to admission. 7. Type 2 non-insulin-dependent diabetes. 8. History of gastritis and gastroesophageal reflux, followed by Dr.     Marina Goodell.  HOSPITAL COURSE:  Kevin Rhodes is a 77 year old male followed by Dr. Allyson Sabal.  He has a past history of coronary disease and had a inferior wall infarction in 1997 treated with PCI.  He had a functional study in November 2011 that showed scar but no ischemia.  He presented recently with unstable angina on January 06, 2011 to the office.  Amlodipine 5 mg a day and Imdur were added.  He was set up for diagnostic catheterization.  The patient stopped taking the Imdur because of headache, he apparently was not sure which medicine was causing the headaches, so we stopped his Norvasc as well.  The patient was admitted on January 18, 2011 and underwent diagnostic catheterization which revealed an 80% RCA that was dilated and stented with a Promus stent. He had an 80-90% LAD stenosis as well.  His EF was 45-50%.  He underwent staged intervention to the LAD, although it does not appear that he received a stent to the site.  Post procedure, he did  well.  His hemoglobin was 8.4 at discharge.  He denies any melena.  Dr. Allyson Sabal feels he can be discharged and follow up with [name].  He has an appointment in 2 weeks.  We will check a CBC next week and see him in the office in 1-2 weeks.  Please see med rec for complete discharge medications.  LABORATORY DATA:  White count 6.3, hemoglobin 8.4, hematocrit 28.4, and platelets 159.  Sodium 137, potassium 5.0, BUN 22, and creatinine 1.11.  DISPOSITION:  The patient discharged in stable condition and will follow up with Dr. Nanetta Batty and Dr. Herb Grays.     Abelino Derrick, P.A.   ______________________________ Nanetta Batty, M.D.    [name]  D:  01/20/2011  T:  01/20/2011  Job:  [id number]  cc:   [name] R. Collins [place], M.D.  Electronically Signed by Corine Shelter P.A. on 01/21/2011 06:08:07 PM Electronically Signed by Nanetta Batty M.D. on 01/22/2011 11:51:56 AM

## 2011-01-22 NOTE — Cardiovascular Report (Signed)
  NAME:  Kevin Rhodes, Kevin Rhodes NO.:  [id number]  MEDICAL RECORD NO.:  [id number]  LOCATION:  2508                         FACILITY:  [facility]  PHYSICIAN:  Nanetta Batty, M.D.   DATE OF BIRTH:  08-30-1933  DATE OF PROCEDURE: DATE OF DISCHARGE:                           CARDIAC CATHETERIZATION   HISTORY OF PRESENT ILLNESS:  Kevin Rhodes is a 77 year old married white male father of 3, grandfather of 7 grandchildren who has a history of an inferior wall myocardial infarction treated with PCI on October 15, 1995. He had moderate LAD and circumflex disease as well as ramus branch disease at that time.  His other problems include hypertension and hyperlipidemia.  He was admitted with new-onset exertional angina for diagnostic cath.  Yesterday, was found to have high-grade mid right and proximal LAD stenosis.  He underwent staged RCA stenting yesterday and presents today for proximal LAD PCI stenting.  DESCRIPTION OF PROCEDURE:  The patient was brought to the [facility] in the postabsorptive state.  He was premedicated with p.o. Valium, IV Versed and fentanyl.  His right groin was prepped and shaved in the usual sterile fashion.  Xylocaine 1% was used for local anesthesia.  A 6-French sheath was inserted into the right femoral artery using standard Seldinger technique.  A 5-French JR- 4 catheter was used for selective right coronary artery angiography.  A 6-French XB LAD 4.0 guide catheter was used for a proximal LAD intervention.  Visipaque dye was used for the entirety of the case. Retrograde aortic pressures were monitored during the case.  The patient received Angiomax bolus with an ACT of 309.  Total contrast used during the case was 135 mL.  Using an 0.14 x 190 Asahi Prowater wire along with a 2.5 x 10 AngioSculpt cutting balloon atherectomy was performed in the proximal LAD lesion.  Following this, a 3.0 x 18 Medtronic Resolute  drug-eluting stent was then deployed at 16 atmospheres and postdilated with a 3.25 x 15 [place] Sprinter at 18 atmospheres (3.44 mm) resulting reduction of 95% proximal LAD stenosis to 0% residual with excellent flow and no dissection.  The patient tolerated the procedure well without hemodynamic  electrocardiographic Sequelae.  IMPRESSION:  Successful proximal left anterior descending cutting balloon atherectomy, percutaneous coronary intervention and stenting using drug-eluting stent and Angiomax.  The patient was on aspirin and Effient.  Guidewire and catheter removed.  The sheath was sewn securely in place.  The patient left the lab in stable condition.  He will be gently hydrated overnight, discharged home in the morning.     Nanetta Batty, M.D.     JB/MEDQ  D:  01/19/2011  T:  01/20/2011  Job:  [id number]  cc:   [facility] [name] R. Collins Scotland, M.D.  Electronically Signed by Nanetta Batty M.D. on 01/22/2011 11:51:51 AM

## 2011-01-22 NOTE — Cardiovascular Report (Signed)
NAME:  Kevin Rhodes, Kevin Rhodes NO.:  [id number]  MEDICAL RECORD NO.:  [id number]  LOCATION:                                 FACILITY:  PHYSICIAN:  Kevin Rhodes, M.D.   DATE OF BIRTH:  [birth year]  DATE OF PROCEDURE:  01/18/2011 DATE OF DISCHARGE:                           CARDIAC CATHETERIZATION   Kevin Rhodes is a 77 year old married Caucasian male, father of 3, grandfather of 7 grandchildren, with history of CAD, status post inferior wall myocardial infarction, treated with PCI, October 15, 1995. He had moderate LAD, circumflex, and ramus branch disease as well at that time.  His last functional study performed in November 2011 showed inferior scar without ischemia.  His other problems include hypertension, hyperlipidemia.  He has had new-onset reproducible exertional chest pain and presents now for angiography potential percutaneous intervention.  PROCEDURE DESCRIPTION:  The patient was brought to Second Floor [place] cardiac cath lab in the postabsorptive state.  Premedicated with p.o. Valium, IV Versed and fentanyl.  His right groin was prepped and shaved in usual sterile fashion.  1% Xylocaine was used for local anesthesia.  A 5-French sheath was inserted into the right femoral artery using standard Seldinger technique.  A 5-French left Judkins diagnostic catheter as well as 5-French pigtail catheter were used for selective cholangiography, left ventriculography, subselective left internal mammary artery angiography, and distal abdominal aortography. Visipaque dye was used for entirety of the case.  Retrograde aortic, left ventricular, and pullback pressures were recorded.  HEMODYNAMICS: 1. Aortic systolic pressure 165, diastolic pressure 83. 2. Left ventricle systolic pressure 168, end-diastolic pressure 23.  SELECTIVE CORONARY ANGIOGRAPHY: 1. Left main normal. 2. LAD; the LAD had an 80%-90% proximal stenosis. 3. Circumflex; nondominant, the most 30-40%  ostial stenosis. 4. Ramus branch; small to medium size and free of systemic disease. 5. Right coronary artery; dominant with a segmental 50% to 60%     proximal to mid stenosis and fairly focal 75% stenosis in the mid     midportion. 6. Left ventriculography; RAO left ventriculogram was performed using     25 mL of Visipaque dye at 12 mL per second.  The overall LVEF was     estimated at approximate 40%-45% with moderate inferobasal     hypokinesia. 7. Left internal mammary artery;  this vessel was subselectively     visualized and was widely patent.  Suitable for use during coronary     artery bypass grafting if required. 8. Distal abdominal aortography; distal abdominal aortogram was     performed using 20 mL of Visipaque dye at 20 mL per second.  The     renal arteries are not well visualized.  Infrarenal abdominal aorta     and iliac bifurcation appear free of significant atherosclerotic     changes.  IMPRESSION:  Kevin Rhodes has two-vessel disease with mild-to-moderate LV dysfunction and exertional angina.  Our plan is to fix his dominant RCA with drug-eluting stent, Effient, and Angiomax and stage his proximal LAD lesion.  DESCRIPTION OF PROCEDURE:  The existing 5-French sheath was exchanged over wire for a 6-French sheath.  The patient did received four baby aspirin today and received 60 mg  of Effient, Angiomax bolus with an ACT of approximately 280.  Total of approximately 200 mL of contrast was administered to the patient.  Using a 6-French JR-4 guide catheter along with an 0.014 x 190 Asahi soft wire and a 2 x 12 Emerge balloon, predilatation was performed at nominal pressures.  Following this, stenting was performed with a 2.75 x 16 Promus Element drug-eluting stent at 14 atmospheres.  Postdilatation was performed with a 3.25 x 12 [place] Sprinter at 14 atmospheres (3.3 millimeters) performing overlapping inflations.  The patient tolerated the procedure well without  hemodynamic or electrocardiographic sequelae. The guidewire and catheter removed.  Sheath was sewn securely in place. The patient left the lab in stable condition.  IMPRESSION:  Successful PCI stenting of a dominant RCA with Promus Element drug-eluting stent.  Plans will be to continue aspirin and Effient, gently hydrate overnight, and perform staged proximal LAD and PCI stenting tomorrow.  The patient left the lab in stable condition.     Kevin Rhodes, M.D.     JB/MEDQ  D:  01/18/2011  T:  01/18/2011  Job:  [id number]  cc:   [facility] [place] [facility] [name] R. Collins Scotland, M.D.  Electronically Signed by Kevin Rhodes M.D. on 01/22/2011 11:51:47 AM

## 2011-01-24 ENCOUNTER — Inpatient Hospital Stay (HOSPITAL_COMMUNITY)
Admission: EM | Admit: 2011-01-24 | Discharge: 2011-01-27 | DRG: 379 | Disposition: A | Discharge status: Home / Self Care | Payer: Medicare Other | Source: Ambulatory Visit | Attending: Cardiovascular Disease | Admitting: Cardiovascular Disease

## 2011-01-24 ENCOUNTER — Telehealth: Payer: Self-pay | Admitting: Internal Medicine

## 2011-01-24 DIAGNOSIS — I2510 Atherosclerotic heart disease of native coronary artery without angina pectoris: Secondary | ICD-10-CM | POA: Diagnosis present

## 2011-01-24 DIAGNOSIS — E785 Hyperlipidemia, unspecified: Secondary | ICD-10-CM | POA: Diagnosis present

## 2011-01-24 DIAGNOSIS — K254 Chronic or unspecified gastric ulcer with hemorrhage: Principal | ICD-10-CM | POA: Diagnosis present

## 2011-01-24 DIAGNOSIS — Z79899 Other long term (current) drug therapy: Secondary | ICD-10-CM

## 2011-01-24 DIAGNOSIS — Z7982 Long term (current) use of aspirin: Secondary | ICD-10-CM

## 2011-01-24 DIAGNOSIS — K219 Gastro-esophageal reflux disease without esophagitis: Secondary | ICD-10-CM | POA: Diagnosis present

## 2011-01-24 DIAGNOSIS — K222 Esophageal obstruction: Secondary | ICD-10-CM | POA: Diagnosis present

## 2011-01-24 DIAGNOSIS — Z7902 Long term (current) use of antithrombotics/antiplatelets: Secondary | ICD-10-CM

## 2011-01-24 DIAGNOSIS — I252 Old myocardial infarction: Secondary | ICD-10-CM

## 2011-01-24 DIAGNOSIS — D500 Iron deficiency anemia secondary to blood loss (chronic): Secondary | ICD-10-CM | POA: Diagnosis present

## 2011-01-24 DIAGNOSIS — I10 Essential (primary) hypertension: Secondary | ICD-10-CM | POA: Diagnosis present

## 2011-01-24 DIAGNOSIS — Z9861 Coronary angioplasty status: Secondary | ICD-10-CM

## 2011-01-24 LAB — DIFFERENTIAL
Eosinophils Absolute: 0.5 10*3/uL (ref 0.0–0.7)
Eosinophils Relative: 6 % — ABNORMAL HIGH (ref 0–5)
Lymphs Abs: 1.9 10*3/uL (ref 0.7–4.0)
Monocytes Absolute: 0.6 10*3/uL (ref 0.1–1.0)
Monocytes Relative: 7 % (ref 3–12)

## 2011-01-24 LAB — CBC
MCH: 22.6 pg — ABNORMAL LOW (ref 26.0–34.0)
MCHC: 30.1 g/dL (ref 30.0–36.0)
MCV: 75.1 fL — ABNORMAL LOW (ref 78.0–100.0)
Platelets: 212 10*3/uL (ref 150–400)
RDW: 18.1 % — ABNORMAL HIGH (ref 11.5–15.5)

## 2011-01-24 LAB — POCT I-STAT, CHEM 8
BUN: 37 mg/dL — ABNORMAL HIGH (ref 6–23)
Calcium, Ion: 1.15 mmol/L (ref 1.12–1.32)
Chloride: 110 mEq/L (ref 96–112)
Glucose, Bld: 249 mg/dL — ABNORMAL HIGH (ref 70–99)
TCO2: 18 mmol/L (ref 0–100)

## 2011-01-24 LAB — ABO/RH: ABO/RH(D): A POS

## 2011-01-24 NOTE — Telephone Encounter (Signed)
Pt scheduled to see Willette Cluster NP 01/25/11@9 :30am. Dr. Hazle Coca office to notify pt of appt date and time.

## 2011-01-25 ENCOUNTER — Ambulatory Visit: Payer: Medicare Other | Admitting: Nurse Practitioner

## 2011-01-25 DIAGNOSIS — R195 Other fecal abnormalities: Secondary | ICD-10-CM

## 2011-01-25 DIAGNOSIS — K219 Gastro-esophageal reflux disease without esophagitis: Secondary | ICD-10-CM

## 2011-01-25 DIAGNOSIS — D509 Iron deficiency anemia, unspecified: Secondary | ICD-10-CM

## 2011-01-25 DIAGNOSIS — Z86010 Personal history of colonic polyps: Secondary | ICD-10-CM

## 2011-01-25 LAB — CBC
Hemoglobin: 9.3 g/dL — ABNORMAL LOW (ref 13.0–17.0)
MCH: 23.8 pg — ABNORMAL LOW (ref 26.0–34.0)
MCHC: 31.3 g/dL (ref 30.0–36.0)
MCHC: 31.4 g/dL (ref 30.0–36.0)
Platelets: 159 10*3/uL (ref 150–400)
RBC: 3.44 MIL/uL — ABNORMAL LOW (ref 4.22–5.81)
RDW: 17.5 % — ABNORMAL HIGH (ref 11.5–15.5)

## 2011-01-25 LAB — GLUCOSE, CAPILLARY
Glucose-Capillary: 149 mg/dL — ABNORMAL HIGH (ref 70–99)
Glucose-Capillary: 217 mg/dL — ABNORMAL HIGH (ref 70–99)

## 2011-01-26 DIAGNOSIS — Z1211 Encounter for screening for malignant neoplasm of colon: Secondary | ICD-10-CM

## 2011-01-26 DIAGNOSIS — K222 Esophageal obstruction: Secondary | ICD-10-CM

## 2011-01-26 DIAGNOSIS — K250 Acute gastric ulcer with hemorrhage: Secondary | ICD-10-CM

## 2011-01-26 HISTORY — PX: COLONOSCOPY: SHX174

## 2011-01-26 HISTORY — PX: ESOPHAGOGASTRODUODENOSCOPY: SHX1529

## 2011-01-26 LAB — CBC
MCHC: 31.6 g/dL (ref 30.0–36.0)
Platelets: 196 10*3/uL (ref 150–400)
RDW: 17.6 % — ABNORMAL HIGH (ref 11.5–15.5)

## 2011-01-26 LAB — CROSSMATCH
ABO/RH(D): A POS
Unit division: 0
Unit division: 0

## 2011-01-26 LAB — GLUCOSE, CAPILLARY
Glucose-Capillary: 226 mg/dL — ABNORMAL HIGH (ref 70–99)
Glucose-Capillary: 151 mg/dL — ABNORMAL HIGH (ref 70–99)

## 2011-01-27 LAB — CBC
HCT: 28.1 % — ABNORMAL LOW (ref 39.0–52.0)
Hemoglobin: 8.9 g/dL — ABNORMAL LOW (ref 13.0–17.0)
MCH: 24.5 pg — ABNORMAL LOW (ref 26.0–34.0)
MCHC: 31.7 g/dL (ref 30.0–36.0)

## 2011-01-30 NOTE — Discharge Summary (Signed)
NAMECOREE, BRAME NO.:  [id number]  MEDICAL RECORD NO.:  [id number]  LOCATION:  3030                         FACILITY:  [facility]  PHYSICIAN:  Thurmon Fair, MD     DATE OF BIRTH:  11-16-1933  DATE OF ADMISSION:  01/24/2011 DATE OF DISCHARGE:  01/27/2011                              DISCHARGE SUMMARY   DISCHARGE DIAGNOSES: 1. Gastrointestinal bleed status post colonoscopy and EGD.  Multiple     ulcers in the antrum.  Normal duodenum.  Esophageal stricture     noted. 2. Unstable angina on January 18, 2011. 3. Coronary artery disease status post drug-eluting stent to the right     coronary artery on January 18, 2011, with left anterior     descending artery balloon angioplasty on January 19, 2011. 4. Mild left ventricular dysfunction, ejection fraction of 45-50%. 5. Anemia secondary to the gastrointestinal bleed. 6. Diabetes mellitus type 2. 7. Hypertension. 8. Dyslipidemia. 9. History of peptic ulcer disease.  HOSPITAL COURSE:  Kevin Rhodes is a 77 year old Caucasian male with a history of unstable angina, coronary artery disease with recent drug- eluting stent to right coronary artery, and angioplasty of the LAD.  He also has a history of colon polyps, diverticulosis, hemorrhoid, esophagitis, duodenitis, esophageal stricture, gastroesophageal reflux disease, peptic ulcer disease, hypertension, hyperlipidemia.  He presented to the emergency department after having his hemoglobin checked at a lab earlier that day.  At that time, hemoglobin was found to 7.5.  He was instructed to go to the [facility] ED for transfusion. He was ultimately admitted with heme-positive stools.  He was transfused 2 units of packed red blood cells with 20 mg of IV Lasix in between first and second dose.  Some medications were continued.  On the morning of January 25, 2011, rechecked his hemoglobin, was at 8.2.  This is again after 2 units of packed red blood cells.  A GI  consult was called.  He is set up for EGD and colonoscopy.  Colonoscopy showed a moderate diverticulosis in the left colon, otherwise a normal colonoscopy with internal hemorrhoids.  Endoscopy showed esophageal stricture, ulcers, and multiple clean ulcers were found in the antrum.  Duodenal bulb was normal in appearance as was the postbulbar duodenum.  The patient was started on b.i.d. Protonix and was told to avoid nonsteroidal antiinflammatories.  Currently, the patient feels great this is on January 27, 2011.  He had been seen by Dr. Royann Shivers, feels he is stable for discharge home if hemoglobin is stable and it is.  A recheck of the hemoglobin shows at 8.9 and on January 26, 2011, it was a 8.7.  DISCHARGE LABORATORY DATA:  WBC 6.0, hemoglobin 8.9, hematocrit 28.1, platelets 213.  PT 14.7, INR 1.13, PTT 31.  Sodium 140, potassium 3.9, chloride 110, BUN 37, creatinine 1.70.  STUDIES/PROCEDURES:  EGD and colonoscopy.  For details, see above.  DISCHARGE MEDICATIONS: 1. Protonix 40 mg 1 tablet by mouth twice daily. 2. Aspirin 81 mg 1 tablet by mouth daily. 3. Avapro 300 mg 1 tablet by mouth daily. 4. Amlodipine 5 mg 1 tablet by mouth daily. 5. Hydrochlorothiazide 25 mg 1/2 tablet by mouth every other day.  6. Lovaza 1 g 1 capsule by mouth twice daily. 7. Metformin 500 mg 2 tablets by mouth twice daily. 8. Metoprolol tartrate 50 mg 1 tablet by mouth twice daily. 9. Nitroglycerin sublingual 0.4 mg 1 tablet under the tongue every 5     minutes up to three doses for chest pain. 10.Prasugrel 10 mg 1 capsule by mouth daily. 11.Simvastatin 40 mg 1 tablet by mouth daily at bedtime. 12.Tylenol 325 mg 1 tablet by mouth every 4 hours as needed.  DISPOSITION:  [name] will be discharged home in stable condition. He is recommended to increase his activity slowly.  He should eat a low- sodium heart-healthy diet also low in carbohydrate.  He is recommended to avoid nonsteroidal  antiinflammatories.  He will follow with [name] on March 09, 2011, and then with Dr. Allyson Sabal.  Office will call him with the appointment time.  He will also be set up for followup of CBC and basic metabolic panel.  Followup both hemoglobin and his creatinine. It should be completed on Monday, January 30, 2011, at the lab of his choice.    ______________________________ Wilburt Finlay, PA   ______________________________ Thurmon Fair, MD    BH/MEDQ  D:  01/27/2011  T:  01/27/2011  Job:  [id number]  cc:   [name] R. Collins Scotland, M.D.  Electronically Signed by Wilburt Finlay PA on 01/30/2011 09:57:55 AM Electronically Signed by Thurmon Fair M.D. on 01/30/2011 03:36:25 PM

## 2011-02-01 ENCOUNTER — Ambulatory Visit: Payer: Medicare Other | Admitting: Internal Medicine

## 2011-02-16 ENCOUNTER — Other Ambulatory Visit: Payer: Self-pay | Admitting: Internal Medicine

## 2011-02-24 NOTE — H&P (Signed)
NAME:  Kevin Rhodes, GEDNEY NO.:  [id number]  MEDICAL RECORD NO.:  [id number]  LOCATION:  [facility]                         FACILITY:  [facility]  PHYSICIAN:  Kevin Rhodes, M.D.   DATE OF BIRTH:  1933-12-10  DATE OF ADMISSION:  01/24/2011 DATE OF DISCHARGE:                             HISTORY & PHYSICAL   CHIEF COMPLAINTS:  Anemia.  HISTORY OF PRESENT ILLNESS:  [name] is a 77 year old Caucasian male with a history of unstable angina, coronary artery disease with recent drug-eluting stent to the right coronary artery.  This was on January 18, 2011, and percutaneous transluminal angioplasty to left anterior descending artery on January 19, 2011.  He has mild left ventricular dysfunction with ejection fraction of 45%, hypertension, dyslipidemia, anemia, type 2 non-insulin-dependent diabetes, and history of gastritis and gastroesophageal reflux disease.  He presented to the ED after having hemoglobin checked with his labs where it was found to be 7.5.  He was subsequently sent over to the ED here at [facility] for transfusion of 2 units of PRBCs.  The patient states that he did have some dizziness and lightheadedness yesterday as well as some nausea, but all that has been resolved and this morning feels great.  He denies any chest pain, shortness of breath, nausea, vomiting, abdominal pain, palpitations, or headache.  The patient does complain of dark stools.  MEDICATIONS: 1. Amlodipine 5 mg daily. 2. Protonix 40 mg daily. 3. Effient 10 mg in the evening. 4. Aspirin 81 mg in the evening. 5. Avapro 300 mg daily. 6. Hydrochlorothiazide 25 mg 1/2 tablet every other day. 7. Lovaza 1 g twice daily. 8. Metformin 500 mg 2 tablets twice daily. 9. Lopressor 50 mg twice daily. 10.Simvastatin 40 mg daily at bedtime.  ALLERGIES:  ZOLPIDEM.  PAST MEDICAL HISTORY: 1. Coronary artery disease with inferior wall infarction in 1997,     treated with PCI. 2. Choledocholithiasis  with cholangitis in October 2006, status post     ERCP. 3. Colon polyps. 4. Diverticulosis. 5. Hemorrhoids. 6. Esophagitis. 7. Duodenitis. 8. Esophageal stricture. 9. Gastroesophageal reflux disease/ 10.Hypertension. 11.Hyperlipidemia.  FAMILY HISTORY:  Noncontributory.  SOCIAL HISTORY:  The patient is married, father of 7.  REVIEW OF SYSTEMS:  As per HPI.  PHYSICAL EXAM:  VITAL SIGNS:  Blood pressure 103/58, temperature 98.4, heart rate 67, respiratory rate of 12, oxygen saturation 100% on room air, and temperature 98.2. GENERAL:  The patient is a Caucasian male in no apparent distress. HEENT:  Pupils are equal, round, and reactive to light and accommodation.  Extraocular movements are intact.  No scleral icterus. NECK:  Nontender.  Negative lymphadenopathy. HEART:  Regular rate and rhythm.  2/6 systolic murmur on the left sternal border as well as axillary region. PULMONARY:  Clear to auscultation bilaterally.  Negative wheezes or rhonchi. ABDOMEN:  Soft, nontender.  Positive bowel sounds in all quadrants. Peripheral vascular was 2+ dorsalis pedis and 2+ radial pulses.  Trace lower extremity edema.  Right-sided carotid bruit, negative femoral bruits. NEURO:  The patient is alert and oriented x3.  Strength is 5/5, equal in the bilateral upper and lower extremities.  LABS:  WBC 7.7, hemoglobin 8.1, hematocrit 26.9, and platelets  212.  PTT 31, INR 1.13, PT is 14.7.  Sodium 140, potassium 3.9, chloride 110, BUN 37, creatinine 1.70, glucose 249, and carbon dioxide 18.  Hemoccult positive. The patient does complain of dark stools.  IMPRESSION: 1. Anemia, possibly secondary to gastrointestinal bleed. 2. Gastrointestinal bleed, heme-positive stool. 3. Coronary artery disease, status post recent stent to the right     coronary artery and proximal left anterior descending, discharged     on January 20, 2011. 4. Hypertension. 5. Hyperlipidemia.  PLAN:  The patient will be  admitted to on non-telemetry room, be transfused with 2 units of packed red blood cells.  We will give him IV Lasix 20 mg after the first unit.  Home medications will be continued. He was started on heart-healthy, low-carbohydrate diet.  We will recheck his hemoglobin in the morning.    ______________________________ Kevin Finlay, PA   ______________________________ Kevin Rhodes, M.D.   BH/MEDQ  D:  01/24/2011  T:  01/24/2011  Job:  [id number]  cc:   Wilhemina Bonito. Marina Goodell, MD  Electronically Signed by Kevin Finlay PA on 01/27/2011 10:45:15 AM Electronically Signed by Kevin Rhodes M.D. on 02/24/2011 05:24:19 PM

## 2011-02-27 ENCOUNTER — Encounter (HOSPITAL_COMMUNITY): Payer: Medicare Other

## 2011-02-27 ENCOUNTER — Ambulatory Visit (HOSPITAL_COMMUNITY): Payer: Medicare Other

## 2011-03-01 ENCOUNTER — Encounter (HOSPITAL_COMMUNITY): Payer: Medicare Other | Attending: Cardiovascular Disease

## 2011-03-01 ENCOUNTER — Ambulatory Visit (HOSPITAL_COMMUNITY): Payer: Medicare Other

## 2011-03-01 DIAGNOSIS — K219 Gastro-esophageal reflux disease without esophagitis: Secondary | ICD-10-CM | POA: Insufficient documentation

## 2011-03-01 DIAGNOSIS — Z7982 Long term (current) use of aspirin: Secondary | ICD-10-CM | POA: Insufficient documentation

## 2011-03-01 DIAGNOSIS — Z79899 Other long term (current) drug therapy: Secondary | ICD-10-CM | POA: Insufficient documentation

## 2011-03-01 DIAGNOSIS — Z9861 Coronary angioplasty status: Secondary | ICD-10-CM | POA: Insufficient documentation

## 2011-03-01 DIAGNOSIS — I2510 Atherosclerotic heart disease of native coronary artery without angina pectoris: Secondary | ICD-10-CM | POA: Insufficient documentation

## 2011-03-01 DIAGNOSIS — Z5189 Encounter for other specified aftercare: Secondary | ICD-10-CM | POA: Insufficient documentation

## 2011-03-01 DIAGNOSIS — I252 Old myocardial infarction: Secondary | ICD-10-CM | POA: Insufficient documentation

## 2011-03-01 DIAGNOSIS — I10 Essential (primary) hypertension: Secondary | ICD-10-CM | POA: Insufficient documentation

## 2011-03-01 DIAGNOSIS — I200 Unstable angina: Secondary | ICD-10-CM | POA: Insufficient documentation

## 2011-03-01 DIAGNOSIS — E785 Hyperlipidemia, unspecified: Secondary | ICD-10-CM | POA: Insufficient documentation

## 2011-03-01 DIAGNOSIS — E119 Type 2 diabetes mellitus without complications: Secondary | ICD-10-CM | POA: Insufficient documentation

## 2011-03-01 LAB — GLUCOSE, CAPILLARY
Glucose-Capillary: 116 mg/dL — ABNORMAL HIGH (ref 70–99)
Glucose-Capillary: 127 mg/dL — ABNORMAL HIGH (ref 70–99)

## 2011-03-01 NOTE — Progress Notes (Signed)
Kevin Rhodes 77 y.o. male       Nutrition Screen                                                                    YES  NO Do you live in a nursing home?  X   Do you eat out more than 3 times/week?   X  If yes, how many times per week do you eat out?  Do you have food allergies?   X If yes, what are you allergic to?  Have you gained or lost more than 10 lbs without trying?               X If yes, how much weight have you lost and over what time period?   lbs gained or lost over weeks/month  Do you want to lose weight?     X If yes, what is a goal weight or amount of weight you would like to lose?  Do you eat alone most of the time?   X   Do you eat less than 2 meals/day?  X If yes, how many meals do you eat?  Do you drink more than 3 alcohol drinks/day?  X If yes, how many drinks per day?  Are you having trouble with constipation? *  X If yes, what are you doing to help relieve constipation?  Do you have financial difficulties with buying food?*    X   Are you experiencing regular nausea/ vomiting?*     X   Do you have a poor appetite? *                                        X   Do you have trouble chewing/swallowing? *   X    Pt with diagnoses of:  X  MI 1997        X Stent/ PTCA          X DM/A1c >6 / CBG >126 X Dyslipidemia  / HDL< 40 / LDL>70 / High TG      X %  Body fat >goal / Body Mass Index >25 X HTN / BP >120/80       Pt Risk Score    1       Diagnosis Risk Score  75       Total Risk Score   76                        X High Risk                   Low Risk              HT: 67.5 WT: 90.3 kg (198.7 lbs) IBW 68.6 132%IBW BMI 30.7 33.7%body fat   PMH: GI bleed, Peptic Ulcer disease, Anemia due to GI bleed, MI, s/p stent, type 2 DM, HTN, Dyslipidemia MEDS: Hydrochlorothiazide, Lovaza, Metformin     Activity level: Pt is moderately active  Wt goal: 174-186# (79.1-84.5 kg) Current tobacco use? No      Food/Drug Interaction? No  Labs:  01/20/11  Glucose  129   No  results found for this basename: HGBA1C   LDL goal: < 70      MI, DM, Carotid or PVD and > 2:      tobacco, HTN, HDL, family h/o, lipoprotein a     > 75 yo male or        >75 yo male   Estimated Daily Nutrition Needs for: ? wt loss  1400-1900 Kcal , Total Fat 35-50gm, Saturated Fat 10-15 gm, Trans Fat 1.4-1.9 gm,  Sodium 1500 mg or less, Grams of CHO 175

## 2011-03-01 NOTE — Progress Notes (Signed)
Patient in today for first cardiac rehab exercise session.  Pt tolerated light exercise with no complaints of chest pain or shortness of breath.  Monitor showed sinus rhythmn with QRS .12 and slight st depression.  Continue to monitor.

## 2011-03-03 ENCOUNTER — Encounter (HOSPITAL_COMMUNITY): Payer: Medicare Other

## 2011-03-03 ENCOUNTER — Ambulatory Visit (HOSPITAL_COMMUNITY): Payer: Medicare Other

## 2011-03-03 NOTE — Progress Notes (Signed)
Pt in today for third exercise session.  Pt had rare pvc noted on ekg.Pt asymptomatic with no complaints.  Rehab report and ekg strips sent to Dr Allyson Sabal for review.

## 2011-03-06 ENCOUNTER — Encounter (HOSPITAL_COMMUNITY): Payer: Self-pay | Admitting: General Practice

## 2011-03-06 ENCOUNTER — Encounter (HOSPITAL_COMMUNITY): Payer: Medicare Other

## 2011-03-06 ENCOUNTER — Other Ambulatory Visit: Payer: Self-pay

## 2011-03-06 ENCOUNTER — Inpatient Hospital Stay (HOSPITAL_COMMUNITY): Payer: Medicare Other

## 2011-03-06 ENCOUNTER — Inpatient Hospital Stay (HOSPITAL_COMMUNITY)
Admission: AD | Admit: 2011-03-06 | Discharge: 2011-03-07 | DRG: 812 | Disposition: A | Discharge status: Home / Self Care | Payer: Medicare Other | Source: Ambulatory Visit | Attending: Cardiovascular Disease | Admitting: Cardiovascular Disease

## 2011-03-06 ENCOUNTER — Ambulatory Visit (HOSPITAL_COMMUNITY): Payer: Medicare Other

## 2011-03-06 DIAGNOSIS — D500 Iron deficiency anemia secondary to blood loss (chronic): Principal | ICD-10-CM | POA: Diagnosis present

## 2011-03-06 DIAGNOSIS — E119 Type 2 diabetes mellitus without complications: Secondary | ICD-10-CM | POA: Diagnosis present

## 2011-03-06 DIAGNOSIS — R0602 Shortness of breath: Secondary | ICD-10-CM

## 2011-03-06 DIAGNOSIS — I2510 Atherosclerotic heart disease of native coronary artery without angina pectoris: Secondary | ICD-10-CM | POA: Diagnosis present

## 2011-03-06 DIAGNOSIS — D638 Anemia in other chronic diseases classified elsewhere: Secondary | ICD-10-CM | POA: Diagnosis present

## 2011-03-06 DIAGNOSIS — Z9861 Coronary angioplasty status: Secondary | ICD-10-CM

## 2011-03-06 DIAGNOSIS — I2089 Other forms of angina pectoris: Secondary | ICD-10-CM | POA: Diagnosis present

## 2011-03-06 DIAGNOSIS — I208 Other forms of angina pectoris: Secondary | ICD-10-CM | POA: Diagnosis present

## 2011-03-06 HISTORY — DX: Calculus of kidney: N20.0

## 2011-03-06 HISTORY — DX: Shortness of breath: R06.02

## 2011-03-06 HISTORY — DX: Acute myocardial infarction, unspecified: I21.9

## 2011-03-06 HISTORY — DX: Unspecified osteoarthritis, unspecified site: M19.90

## 2011-03-06 HISTORY — DX: Pneumonia, unspecified organism: J18.9

## 2011-03-06 HISTORY — DX: Essential (primary) hypertension: I10

## 2011-03-06 LAB — COMPREHENSIVE METABOLIC PANEL
ALT: 12 U/L (ref 0–53)
AST: 21 U/L (ref 0–37)
Albumin: 3 g/dL — ABNORMAL LOW (ref 3.5–5.2)
Alkaline Phosphatase: 56 U/L (ref 39–117)
CO2: 23 mEq/L (ref 19–32)
Chloride: 110 mEq/L (ref 96–112)
Creatinine, Ser: 1.28 mg/dL (ref 0.50–1.35)
GFR calc non Af Amer: 53 mL/min — ABNORMAL LOW (ref >90)
Potassium: 4 mEq/L (ref 3.5–5.1)
Sodium: 142 mEq/L (ref 135–145)
Total Bilirubin: 0.8 mg/dL (ref 0.3–1.2)

## 2011-03-06 LAB — PREPARE RBC (CROSSMATCH)

## 2011-03-06 LAB — DIFFERENTIAL
Basophils Relative: 0 % (ref 0–1)
Eosinophils Relative: 8 % — ABNORMAL HIGH (ref 0–5)
Lymphocytes Relative: 36 % (ref 12–46)
Monocytes Relative: 8 % (ref 3–12)
Neutro Abs: 2.1 10*3/uL (ref 1.7–7.7)
Neutrophils Relative %: 48 % (ref 43–77)

## 2011-03-06 LAB — FIBRINOGEN: Fibrinogen: 269 mg/dL (ref 204–475)

## 2011-03-06 LAB — CARDIAC PANEL(CRET KIN+CKTOT+MB+TROPI): Troponin I: <0.3 ng/mL (ref <0.30)

## 2011-03-06 LAB — PROTIME-INR: INR: 1.14 (ref 0.00–1.49)

## 2011-03-06 LAB — APTT: aPTT: 30 seconds (ref 24–37)

## 2011-03-06 LAB — GLUCOSE, CAPILLARY: Glucose-Capillary: 179 mg/dL — ABNORMAL HIGH (ref 70–99)

## 2011-03-06 LAB — CBC
HCT: 21 % — ABNORMAL LOW (ref 39.0–52.0)
Hemoglobin: 6.1 g/dL — CL (ref 13.0–17.0)
RBC: 2.83 MIL/uL — ABNORMAL LOW (ref 4.22–5.81)

## 2011-03-06 MED ORDER — ASPIRIN 81 MG PO CHEW: 81.0000 mg | CHEWABLE_TABLET | Freq: Every day | ORAL | Status: DC

## 2011-03-06 MED ORDER — SIMVASTATIN 40 MG PO TABS
40.0000 mg | ORAL_TABLET | Freq: Every day | ORAL | Status: DC
  Filled 2011-03-06: qty 1

## 2011-03-06 MED ORDER — POLYSACCHARIDE IRON 150 MG PO CAPS
150.0000 mg | ORAL_CAPSULE | Freq: Two times a day (BID) | ORAL | Status: DC
  Administered 2011-03-07 (×2): 150 mg via ORAL
  Filled 2011-03-06 (×3): qty 1

## 2011-03-06 MED ORDER — AMLODIPINE BESYLATE 5 MG PO TABS
5.0000 mg | ORAL_TABLET | Freq: Every day | ORAL | Status: DC
  Filled 2011-03-06: qty 1

## 2011-03-06 MED ORDER — PANTOPRAZOLE SODIUM 40 MG PO TBEC
40.0000 mg | DELAYED_RELEASE_TABLET | Freq: Two times a day (BID) | ORAL | Status: DC
  Administered 2011-03-07 (×2): 40 mg via ORAL
  Filled 2011-03-06 (×2): qty 1

## 2011-03-06 MED ORDER — ONDANSETRON HCL 4 MG/2ML IJ SOLN: 4.0000 mg | Freq: Four times a day (QID) | INTRAMUSCULAR | Status: DC | PRN

## 2011-03-06 MED ORDER — HYDROCHLOROTHIAZIDE 12.5 MG PO CAPS
12.5000 mg | ORAL_CAPSULE | ORAL | Status: DC
  Administered 2011-03-07: 12.5 mg via ORAL
  Filled 2011-03-06: qty 1

## 2011-03-06 MED ORDER — PRASUGREL HCL 10 MG PO TABS
10.0000 mg | ORAL_TABLET | Freq: Every day | ORAL | Status: DC
  Administered 2011-03-07: 10 mg via ORAL
  Filled 2011-03-06: qty 1

## 2011-03-06 MED ORDER — SODIUM CHLORIDE 0.9 % IV SOLN
250.0000 mL | INTRAVENOUS | Status: DC
  Administered 2011-03-06: 250 mL via INTRAVENOUS

## 2011-03-06 MED ORDER — NITROGLYCERIN 2 % TD OINT
1.0000 [in_us] | TOPICAL_OINTMENT | Freq: Three times a day (TID) | TRANSDERMAL | Status: DC
  Administered 2011-03-06 – 2011-03-07 (×2): 1 [in_us] via TOPICAL
  Filled 2011-03-06: qty 30

## 2011-03-06 MED ORDER — OLMESARTAN MEDOXOMIL 40 MG PO TABS
40.0000 mg | ORAL_TABLET | Freq: Every day | ORAL | Status: DC
  Administered 2011-03-07: 40 mg via ORAL
  Filled 2011-03-06: qty 1

## 2011-03-06 MED ORDER — OMEGA-3-ACID ETHYL ESTERS 1 G PO CAPS
2.0000 g | ORAL_CAPSULE | Freq: Two times a day (BID) | ORAL | Status: DC
  Administered 2011-03-07 (×2): 2 g via ORAL
  Filled 2011-03-06 (×3): qty 2

## 2011-03-06 MED ORDER — ACETAMINOPHEN 325 MG PO TABS: 650.0000 mg | ORAL_TABLET | ORAL | Status: DC | PRN

## 2011-03-06 MED ORDER — SODIUM CHLORIDE 0.9 % IJ SOLN: 3.0000 mL | INTRAMUSCULAR | Status: DC | PRN

## 2011-03-06 MED ORDER — FUROSEMIDE 10 MG/ML IJ SOLN
20.0000 mg | Freq: Once | INTRAMUSCULAR | Status: AC
  Administered 2011-03-06: 20 mg via INTRAVENOUS
  Filled 2011-03-06: qty 2

## 2011-03-06 MED ORDER — INSULIN ASPART 100 UNIT/ML ~~LOC~~ SOLN
0.0000 [IU] | Freq: Three times a day (TID) | SUBCUTANEOUS | Status: DC
  Filled 2011-03-06 (×2): qty 3

## 2011-03-06 MED ORDER — NITROGLYCERIN 0.4 MG SL SUBL: 0.4000 mg | SUBLINGUAL_TABLET | SUBLINGUAL | Status: DC | PRN

## 2011-03-06 MED ORDER — INSULIN ASPART 100 UNIT/ML ~~LOC~~ SOLN
0.0000 [IU] | Freq: Every day | SUBCUTANEOUS | Status: DC
  Filled 2011-03-06: qty 3

## 2011-03-06 MED ORDER — METOPROLOL TARTRATE 50 MG PO TABS
50.0000 mg | ORAL_TABLET | Freq: Two times a day (BID) | ORAL | Status: DC
  Administered 2011-03-07 (×2): 50 mg via ORAL
  Filled 2011-03-06 (×3): qty 1

## 2011-03-06 NOTE — Progress Notes (Signed)
CRITICAL VALUE ALERT  Critical value received: Hemoglobin 6.1   Date of notification: 03/06/2011   Time of notification: 8:31 PM   Critical value read back:yes   Nurse who received alert: Fredricka Bonine, Hamilton Capri   MD notified (1st page): MD not notified. MD already aware & pt to receive 2 units of PRBC

## 2011-03-06 NOTE — H&P (Signed)
Please see Dr. Hazle Coca H&P from the office for complete details.  Pt. Admitted due to anemia and angina.

## 2011-03-06 NOTE — Consult Note (Signed)
[facility] Consult: 10:20 AM 03/07/2011   Referring Provider:  Allyson Sabal    Primary Care Physician:  Herb Grays, MD, MD Primary Gastroenterologist:  Dr. Yancey Flemings   Reason for Consultation:  Anemia  HPI: Kevin Rhodes is a 77 y.o. male.  Has hx of Gall stones with ERCP in 2009.  Never followed up for surgery regarding lap chole and has had problems with abd pain but none recently.   [name] attributed pain to the GB.   Hx Colon polyps, adenoma  In 2006. Follow up colonoscopy 01/26/2011:  Diverticulosis, internal hemmorrhoids. Hx GERD and ulcers, esophageal stricture.  EGD10/07/2010:  Many clean based antral ulcers, esoph stricture was not dilated.  Admitted early October for anemia hgb 7.2 and got transfused with 2 units of blood. During that admit had the Colon/EGD done. Drug eluting stent placed Sep 28th 2012 and placed on Effient since then.  Never had need of transfusions before Oct 2012.  Also takes  81 mg ASA but no NSAIDS.  Admitted from Cardiology office yesterday with hgb of 6.6.  Was sent to office by cardiac rehab for c/o jaw pain and swelling of the R foot/ ankle.  Denies excessive/new fatigue. Stools greenish and dark from his PO Iron.  No CP or palpitations.  No new dyspnea.  Eating well.  No dysphagia. No GERD sxs and taking Pantoprazole twice daily, had been on once daily before that. Hgb today is 7.7. Received 2 units bood overnight. Planned ROV with [name] for this Thursday.    Past Medical History:   Internal hemorrhoids without mention of compli*              Stricture and stenosis of esophagus, asymptomatic                          Unspecified sinusitis (chronic)                              Allergic rhinitis                                            Diverticulosis of colon (without mention of he*              Benign neoplasm of colon                                     Calculus of bile duct without mention of chole*  Choledocholithiasis:  ERCP, shincterotomy and stone extraction 2009             Other esophagitis                                            Esophageal reflux                                            Peptic ulcer, unspecified site, unspecified as*              Other and unspecified hyperlipidemia  Coronary atherosclerosis, s/p stent Sep 2012             Kidney stones No past surgical history on file.  Prior to Admission medications : amLODipine (NORVASC) 5 MG tablet, Sig Take 5 mg by mouth daily after supper.  ,  aspirin 81 MG chewable tablet, Sig Chew 81 mg by mouth at bedtime.  hydrochlorothiazide (MICROZIDE) 12.5 MG capsule, Sig Take 12.5 mg by mouth every other day.  irbesartan (AVAPRO) 300 MG tablet, Sig Take 300 mg by mouth at bedtime.   metFORMIN (GLUCOPHAGE) 500 MG tablet, Sig Take 500 mg by mouth 2 (two) times daily with a meal.   metoprolol (LOPRESSOR) 50 MG tablet, Sig Take 50 mg by mouth 2 (two) times daily.  omega-3 acid ethyl esters (LOVAZA) 1 G capsule, Sig Take 2 g by mouth 2 (two) times daily.  , pantoprazole (PROTONIX) 40 MG tablet, Sig Take 40 mg by mouth 2 (two) times daily.  polysaccharide iron (NIFEREX) 150 MG CAPS capsule, Sig Take 150 mg by mouth 2 (two) times daily. prasugrel (EFFIENT) 10 MG TABS, Sig Take 10 mg by mouth daily.  , simvastatin (ZOCOR) 40 MG tablet, Sig Take 40 mg by mouth at bedtime.   No current facility-administered medications for this encounter.   Allergies as of 03/06/2011 - Review Complete 03/06/2011  -- Ambien (zolpidem tartrate) -- Other (See Comments) -- noted 03/06/2011   No family history on file.   Social History   Marital Status: Married             Spouse Name:                      Years of Education:                 Number of children:             Occupational History   None on file  Social History Main Topics   Smoking Status: Not on file                      Smokeless Status: Not on file                       Alcohol Use: Not on file    Drug Use: Not on file    Sexual Activity: Not on file        Other Topics            Concern   None on file  Social History Narrative   None on file    REVIEW OF SYSTEMS: Constitutional:  No weakness ENT:  No nose bleeds Pulm:  No dyspnea CV:  No palps or cp.  Assymetric pedal edema on r foot and ankle that resolves if he elevates the limb. GU:  No hematuria GI:  Above Heme:  No excessive bleeding or bruising.    Transfusions:  2 over night Neuro:  No headache, not dizzy, no tingling. Pos for insomnia Derm:  Pruritus without rash all over his body, present at least one year. Endocrine:  No incr thirst or urination Immunization:  Up to date on flu shot Travel:  No non domestic travel   PHYSICAL EXAM: Vital signs in last 24 hours: Temp:  [98 F (36.7 C)] 98 F (36.7 C) (11/12 1600) Pulse Rate:  [52] 52  (11/12 1600) Resp:  [20] 20  (11/12 1600) BP: (132)/(67) 132/67 mmHg (11/12 1600)  SpO2:  [99 %] 99 % (11/12 1600) Last BM Date: 03/06/11  General: Looks well Head:  No trauma  Eyes:  Conjunctiva pale. Ears:  No hearing deficits  Nose:  No discharge Mouth:  Full upper denture. Lower partial Neck:  No JVD, no mass Lungs:  Clear B.  No dyspnea or cough Heart: RRR, no MRG Abdomen:  Soft, NT,ND, active BS.   Rectal: deferred   Musc/Skeltl: no joint deformity Extremities:  Slight non pitting R foot and ankle swelling, not tender, not red or hot  Neurologic:  Pleasant , good historian, no gross deficits, fully oriented Skin:  No rash, no purpura or large bruises Tattoos:  None seen Nodes:  No cervicaladenopathy   Psych:  Affect is norma.  Very pleasant and cooperative, reasonable.  Intake/Output from previous day:   Intake/Output this shift:    LAB RESULTS: Hgb is 7.7, hct is 25.3,  mcv is 75.5  Today.  BMET NA              140   01/24/2011 NA              137   01/20/2011 NA              138   01/19/2011 K                3.9   01/24/2011 K               5.0   01/20/2011 K               4.7   01/19/2011 CL              110   01/24/2011 CL              107   01/20/2011 CL              106   01/19/2011 CO2              24   01/20/2011 CO2              24   01/19/2011 GLUCOSE         249   01/24/2011 GLUCOSE         129   01/20/2011 GLUCOSE         148   01/19/2011 BUN              37   01/24/2011 BUN              22   01/20/2011 BUN              20   01/19/2011 CREATININE     1.70   01/24/2011 CREATININE     1.11   01/20/2011 CREATININE     1.19   01/19/2011 CALCIUM         8.4   01/20/2011 CALCIUM         8.7   01/19/2011  PT:  14.8 INR: 1.14 Cardiac enzymes negative.   RADIOLOGY STUDIES: CXR:  Stable cardiomegaly  ENDOSCOPIC STUDIES: See above in HPI  IMPRESSION: 1.  Recurrent anemia without obvious blood loss.  Question if he needs repeat EGD vs. Go directly to capsule endoscopy. S/P transfusion with 2 units of PRBC, sub optimal response but the only sx of jaw pain is resolved.  Does he need another unit of blood?  Inclined to let [name] make the decisions when he sees him tomorrow.  [name] prefers this approach. 2.  Clean based antral ulcers on EGD last month, reliably taking twice daily Protonix since then. 3. Chronic Effient therapy since Sep 2012 stent.  This seemed to have led to his anemia troubles4.  4. NIDDM: managed with oral agents.  PLAN: 1.  See # 1 above.  Dr Leone Payor will make decisions.  Have asked pt to not eat lunch until I speak with Dr. Leone Payor.   LOS: 0 days   Jennye Moccasin  03/06/2011, 4:52 PM

## 2011-03-06 NOTE — H&P (Signed)
NAME:  [name], [name]:  [id number]  MEDICAL RECORD NO.:  [id number]  LOCATION:  REHS                         FACILITY:  [facility]  PHYSICIAN:  Nanetta Batty, M.D.   DATE OF BIRTH:  03-01-1934  DATE OF ADMISSION:  03/06/2011 DATE OF DISCHARGE:                             HISTORY & PHYSICAL   HISTORY OF PRESENT ILLNESS:  Kevin Rhodes is a very pleasant 77 year old mildly overweight, married, Caucasian male, father of 3, grandfather of 7 grandchildren who is accompanied by his daughter and wife today.  I saw him approximally a week and a half ago.  He has a history of CAD, status post inferior wall myocardial infarction.  She was treated with PCI, stenting by myself in October 15, 1995.  He had moderate LAD and circumflex and ramus branch disease at that time.  Functional studies performed in November, 2011 showed inferior scar without ischemia.  Unchanged from prior studies.  Other problems include hypertension, hyperlipidemia.  I saw him back in September when he was complaining of exertional dyspnea extending to his jaws and arms. Ultimately catheterized him revealing high-grade RCA, LAD disease which I fixed in a staged fashion with drug-eluting stents.  Symptoms resolved though he could become anemic and underwent colonoscopy and endoscopy with Dr. Yancey Flemings, which revealed diverticulosis, esophageal stricture, and ulcers as well as antral ulcers.  PPI was doubled.  Over the last week and a half, he has developed exertional jaw pain and dyspnea.  On October 29, his hemoglobin was 7.5 and hematocrit of 25.3. Today's hemoglobin 6.6 and hematocrit 21.4.  MEDICATIONS: 1. Protonix 40 b.i.d. 2. Aspirin 81. 3. Avapro 300. 4. Amlodipine 5. 5. Hydrochlorothiazide 12.5 mg p.o. every other day. 6. Lovaza b.i.d. 7. Metformin 500 mg p.o. b.i.d. 8. Metoprolol tartrate 50 mg p.o. b.i.d. 9. Effient 10. 10.Simvastatin 40. 11.New iron 150 p.o. b.i.d.  ALLERGIES:   None.  REVIEW OF SYSTEMS:  Twelve-point review of systems was negative except as already noted.  PHYSICAL EXAM:  VITAL SIGNS:  Blood pressure was 144/70, pulse of 56, weight is up 7 pounds in February, 2005. GENERAL:  The patient is alert and oriented. NEUROLOGIC:  Intact. LUNGS:  Clear.  Carotids are 2+ without bruits.  Heart was regular rate and rhythm without murmurs, gallops, rubs, or clicks.  DIAGNOSTIC STUDIES:  Twelve-lead EKG reveals sinus bradycardia 56 of nonspecific IVCD, right axis deviation, and with T-wave inversion inferolaterally.  IMPRESSION:  Kevin Rhodes has significant anemia which is probably hemodynamically significant.  He is admitted today for transfusion of 2 units of packed red blood cells.  Subsequently to get him to a hemoglobin of greater than 9, up to 3 units of blood packed red blood cells.  I will also have Yancey Flemings, [facility], re-consult. Unfortunately, he has a drug-eluting stent which was placed on September 27, which precludes discontinuation of his Effient for at least 9-12 months.     Nanetta Batty, M.D.     JB/MEDQ  D:  03/06/2011  T:  03/06/2011  Job:  [id number]  cc:   [facility] Simmonds. Marina Goodell, MD

## 2011-03-07 ENCOUNTER — Other Ambulatory Visit: Payer: Self-pay

## 2011-03-07 LAB — LIPID PANEL
HDL: 28 mg/dL — ABNORMAL LOW (ref >39)
LDL Cholesterol: 59 mg/dL (ref 0–99)
Triglycerides: 199 mg/dL — ABNORMAL HIGH (ref <150)
VLDL: 40 mg/dL (ref 0–40)

## 2011-03-07 LAB — BASIC METABOLIC PANEL
BUN: 19 mg/dL (ref 6–23)
CO2: 25 mEq/L (ref 19–32)
Calcium: 9 mg/dL (ref 8.4–10.5)
Glucose, Bld: 154 mg/dL — ABNORMAL HIGH (ref 70–99)
Sodium: 143 mEq/L (ref 135–145)

## 2011-03-07 LAB — GLUCOSE, CAPILLARY: Glucose-Capillary: 187 mg/dL — ABNORMAL HIGH (ref 70–99)

## 2011-03-07 LAB — CBC
HCT: 25.3 % — ABNORMAL LOW (ref 39.0–52.0)
Hemoglobin: 7.7 g/dL — ABNORMAL LOW (ref 13.0–17.0)
MCH: 23 pg — ABNORMAL LOW (ref 26.0–34.0)
RBC: 3.35 MIL/uL — ABNORMAL LOW (ref 4.22–5.81)

## 2011-03-07 LAB — TSH: TSH: 0.231 u[IU]/mL — ABNORMAL LOW (ref 0.350–4.500)

## 2011-03-07 LAB — HEMOGLOBIN AND HEMATOCRIT, BLOOD: Hemoglobin: 8.3 g/dL — ABNORMAL LOW (ref 13.0–17.0)

## 2011-03-07 LAB — HEMOGLOBIN A1C: Hgb A1c MFr Bld: 7.6 % — ABNORMAL HIGH (ref <5.7)

## 2011-03-07 MED ORDER — INSULIN ASPART 100 UNIT/ML ~~LOC~~ SOLN
0.0000 [IU] | Freq: Three times a day (TID) | SUBCUTANEOUS | Status: DC
  Administered 2011-03-07: 1 [IU] via SUBCUTANEOUS
  Administered 2011-03-07: 2 [IU] via SUBCUTANEOUS
  Filled 2011-03-07 (×2): qty 3

## 2011-03-07 MED ORDER — ROSUVASTATIN CALCIUM 10 MG PO TABS
10.0000 mg | ORAL_TABLET | Freq: Every day | ORAL | Status: DC
  Filled 2011-03-07: qty 1

## 2011-03-07 NOTE — Progress Notes (Addendum)
[facility]  77 year old man with h/o CAD - multivessel PCI (old Inflateral MI due to LCx lesion) s/p recent staged LAD & RCA PCI, also with h/o GIBleed. Admitted from [facility] after lab values demonstrated Hgb of ~6.  His presenting Sx were DOE & anginal jaw pain.  Subjective: Feels good.  Jaw pain resolved.  Ready to go home.  Did not sleep well.  Objective: Vital signs in last 24 hours: Temp:  [97.9 F (36.6 C)-98.4 F (36.9 C)] 98 F (36.7 C) (11/13 0715) Pulse Rate:  [52-66] 54  (11/13 0715) Resp:  [19-20] 20  (11/13 0715) BP: (118-145)/(59-77) 145/77 mmHg (11/13 0715) SpO2:  [99 %] 99 % (11/12 1600) Weight:  [90.493 kg (199 lb 8 oz)-91.4 kg (201 lb 8 oz)] 201 lb 8 oz (91.4 kg) (11/13 0619) Last BM Date: 03/06/11  Intake/Output from previous day: 11/12 0701 - 11/13 0700 In: 802.5 [P.O.:240; I.V.:500; Blood:62.5] Out: -  Intake/Output this shift:    Medications Current Facility-Administered Medications  Medication Dose Route Frequency Provider Last Rate Last Dose  . 0.9 %  sodium chloride infusion  250 mL Intravenous Continuous Leone Brand, NP 1 mL/hr at 03/06/11 2129 250 mL at 03/06/11 2129  . acetaminophen (TYLENOL) tablet 650 mg  650 mg Oral Q4H PRN Leone Brand, NP      . amLODipine (NORVASC) tablet 5 mg  5 mg Oral QPC supper Leone Brand, NP      . aspirin chewable tablet 81 mg  81 mg Oral QHS Leone Brand, NP      . furosemide (LASIX) injection 20 mg  20 mg Intravenous Once Leone Brand, NP   20 mg at 03/06/11 2125  . hydrochlorothiazide (MICROZIDE) capsule 12.5 mg  12.5 mg Oral QODAY Leone Brand, NP      . insulin aspart (novoLOG) injection 0-5 Units  0-5 Units Subcutaneous QHS Leone Brand, NP      . insulin aspart (novoLOG) injection 0-9 Units  0-9 Units Subcutaneous TID [facility] Colleen Can, PHARMD   1 Units at 03/07/11 [phone]  . metoprolol (LOPRESSOR) tablet 50 mg  50 mg Oral BID Leone Brand, NP   50 mg at 03/07/11  0100  . nitroGLYCERIN (NITROGLYN) 2 % ointment 1 inch  1 inch Topical Q8H Leone Brand, NP   1 inch at 03/07/11 [phone]  . nitroGLYCERIN (NITROSTAT) SL tablet 0.4 mg  0.4 mg Sublingual Q5 min PRN Leone Brand, NP      . olmesartan [facility]) tablet 40 mg  40 mg Oral Daily Leone Brand, NP      . omega-3 acid ethyl esters (LOVAZA) capsule 2 g  2 g Oral BID Leone Brand, NP   2 g at 03/07/11 0059  . ondansetron (ZOFRAN) injection 4 mg  4 mg Intravenous Q6H PRN Leone Brand, NP      . pantoprazole (PROTONIX) EC tablet 40 mg  40 mg Oral BID Leone Brand, NP   40 mg at 03/07/11 0100  . polysaccharide iron (NIFEREX) capsule 150 mg  150 mg Oral BID Leone Brand, NP   150 mg at 03/07/11 0058  . prasugrel (EFFIENT) tablet 10 mg  10 mg Oral Daily Leone Brand, NP      . simvastatin (ZOCOR) tablet 40 mg  40 mg Oral QHS Leone Brand, NP      . sodium chloride 0.9 % injection  3 mL  3 mL Intravenous PRN Leone Brand, NP      . DISCONTD: insulin aspart (novoLOG) injection 0-9 Units  0-9 Units Subcutaneous TID WC Veronda Hollie Salk, PHARMD        PE: General appearance: alert, cooperative and no distress Lungs: clear to auscultation bilaterally Heart: regular rate and rhythm and systolic murmur: holosystolic 2/6, harsh at apex, throughout the precordium, radiates to axilla Abdomen: soft, non-tender; bowel sounds normal; no masses,  no organomegaly Extremities: trace LEE Pulses: 2+ and symmetric  Murmur sounds like Aortic Sclerosis - seen on recent Echo (but not reported in recent clinic note, may be exaggerated due to anemia).  Lab Results:   [facility] 03/07/11 0744 03/06/11 1952  WBC 5.7 4.5  HGB 7.7* 6.1*  HCT 25.3* 21.0*  PLT 157 158   BMET  Basename 03/06/11 1952  NA 142  K 4.0  CL 110  CO2 23  GLUCOSE 170*  BUN 19  CREATININE 1.28  CALCIUM 9.0   PT/INR  Basename 03/06/11 1952  LABPROT 14.8  INR 1.14    Assessment/Plan  Principal Problem:  *Anemia due to  blood loss, chronic Active Problems:  Angina at rest:  Likely demand ischemia secondary to anemia. CAD - chronic (recent multivessel PCI) on dual antiplatelet therapy (DES in LAD & RCA)  Plan:  S/P two units PRBCs.  Hgb to 7.7 from 6.1.  GI consult pending.  History of erosive esophagitis. Jaw pain resolved.   Harsh systolic MM. Probably not new.  Will look for results of any recent echo.  BP ok.  Bradycardia (50s)   LOS: 1 day    HAGER,BRYAN W 03/07/2011 8:48 AM  I have seen and examined the patient along with HAGER,BRYAN W,  PA.  I have reviewed the chart, notes and new data.  I agree with Bryan's note.  PLAN: Follow next CBC level to ensure more appropriate increase - if not, would consider 1 more unit. GI MD to see today - await further recommendations. He is ready to go home - anginal symptoms have resolved.  Will need to discuss with Dr. Allyson Sabal re: plans to evaluate angina other than assuming that it is demand ishemia relating to anemia.  HARDING,DAVID W 03/07/2011, 12:08 PM

## 2011-03-07 NOTE — Consult Note (Signed)
I have discussed the patient with Ms. Clarita Leber and reviewed the chart. Chronic recurrent GI blood loss without hemorrhage appears to be the problem, in setting of anti-platelet drugs. He has small ulcers in stomach, H. Pylori negative. Is on PPI. Inpatient endoscopic evaluation does not seem necessary. I think small bowel capsule endoscopy is sensible and he has follow-up with [name] tomorrow and that can be arranged through our office.

## 2011-03-07 NOTE — Discharge Planning (Cosign Needed)
Physician Discharge Summary  Patient ID: Kevin Rhodes MRN: [id number] DOB/AGE: Sep 17, 1933 77 y.o.  Admit date: 03/06/2011 Discharge date: 03/07/2011  Admission Diagnoses:  Anemia.  Discharge Diagnoses:  Principal Problem:  *Anemia due to blood loss, chronic   Active Problems:   History of EROSIVE ESOPHAGITIS   Angina at rest   Discharged Condition: good  Hospital Course:  Kevin Rhodes is a 77 year old Caucasian male with a history of unstable angina, coronary artery disease with recent drug- eluting stent to right coronary artery, and angioplasty of the LAD.  He  also has a history of colon polyps, diverticulosis, hemorrhoid,  esophagitis, duodenitis, esophageal stricture, gastroesophageal reflux  disease, peptic ulcer disease, hypertension, hyperlipidemia.  Patient was admitted after being seen at [facility] with a hemoglobin of proximally 6. He was complaining of dyspnea on exertion and anginal jaw pain. He received 2 units of packed red blood cells. Hemoglobin has responded with is now 8.3. A GI consult was requested. And outpatient followup has been arranged by  GI. Patient discharged in stable condition.   Significant Diagnostic Studies:   Treatments:  Discharge Exam: Blood pressure 123/59, pulse 47, temperature 97.3 F (36.3 C), temperature source Oral, resp. rate 20, height 5\' 8"  (1.727 m), weight 91.4 kg (201 lb 8 oz), SpO2 99.00%. See physical exam from progress note.  Disposition: Home or Self Care  Discharge Orders    Future Appointments: Provider: Department: Dept Phone: Center:   03/08/2011 11:15 AM [facility] [phone] None   03/09/2011 9:15 AM Yancey Flemings, MD [facility] Office [phone] [facility]   03/10/2011 11:15 AM [facility] [phone] None     Future Orders Please Complete By Expires   Diet - low sodium heart healthy      Increase activity slowly        Current Discharge Medication List    CONTINUE these medications which have NOT CHANGED   Details  amLODipine (NORVASC) 5 MG tablet Take 5 mg by mouth daily after supper.      aspirin 81 MG chewable tablet Chew 81 mg by mouth at bedtime.      hydrochlorothiazide (MICROZIDE) 12.5 MG capsule Take 12.5 mg by mouth every other day.      irbesartan (AVAPRO) 300 MG tablet Take 300 mg by mouth at bedtime.      metFORMIN (GLUCOPHAGE) 500 MG tablet Take 500 mg by mouth 2 (two) times daily with a meal.      metoprolol (LOPRESSOR) 50 MG tablet Take 50 mg by mouth 2 (two) times daily.      omega-3 acid ethyl esters (LOVAZA) 1 G capsule Take 2 g by mouth 2 (two) times daily.      pantoprazole (PROTONIX) 40 MG tablet Take 40 mg by mouth 2 (two) times daily.      polysaccharide iron (NIFEREX) 150 MG CAPS capsule Take 150 mg by mouth 2 (two) times daily.      prasugrel (EFFIENT) 10 MG TABS Take 10 mg by mouth daily.      simvastatin (ZOCOR) 40 MG tablet Take 40 mg by mouth at bedtime.           SignedDwana Melena 03/07/2011, 2:37 PM

## 2011-03-08 ENCOUNTER — Ambulatory Visit (HOSPITAL_COMMUNITY): Payer: Medicare Other

## 2011-03-08 ENCOUNTER — Encounter (HOSPITAL_COMMUNITY): Payer: Medicare Other

## 2011-03-08 LAB — TYPE AND SCREEN
ABO/RH(D): A POS
Unit division: 0
Unit division: 0

## 2011-03-09 ENCOUNTER — Encounter: Payer: Self-pay | Admitting: Internal Medicine

## 2011-03-09 ENCOUNTER — Ambulatory Visit (INDEPENDENT_AMBULATORY_CARE_PROVIDER_SITE_OTHER): Payer: Medicare Other | Admitting: Internal Medicine

## 2011-03-09 DIAGNOSIS — K259 Gastric ulcer, unspecified as acute or chronic, without hemorrhage or perforation: Secondary | ICD-10-CM

## 2011-03-09 DIAGNOSIS — D62 Acute posthemorrhagic anemia: Secondary | ICD-10-CM

## 2011-03-09 DIAGNOSIS — Z860100 Personal history of colon polyps, unspecified: Secondary | ICD-10-CM

## 2011-03-09 DIAGNOSIS — I2510 Atherosclerotic heart disease of native coronary artery without angina pectoris: Secondary | ICD-10-CM

## 2011-03-09 DIAGNOSIS — E119 Type 2 diabetes mellitus without complications: Secondary | ICD-10-CM

## 2011-03-09 DIAGNOSIS — K219 Gastro-esophageal reflux disease without esophagitis: Secondary | ICD-10-CM

## 2011-03-09 NOTE — Progress Notes (Signed)
HISTORY OF PRESENT ILLNESS:  Kevin Rhodes is a 77 y.o. male with hypertension, hyperlipidemia, diabetes mellitus, GERD complicated by peptic stricture, adenomatous colon polyps, coronary artery disease status post coronary artery stent placement September 2012 on aspirin and Effient therapy, and hospitalization for subacute GI bleeding secondary to gastric ulcers October 2012. He was hospitalized 01/24/2011 with intermittent dark stools and anemia. On 01/26/2011 he underwent colonoscopy and upper endoscopy. Complete colonoscopy revealed diverticulosis and hemorrhoids. Upper endoscopy revealed an incidental esophageal stricture as well as multiple clean-based antral ulcers. Testing for Helicobacter pylori was negative. He was treated with twice a day PPI therapy. He was continued on Effient and baby aspirin. He has avoided all other NSAIDs. Since hospital discharge on 01/27/2011 (discharge hemoglobin 8.9), his stools have been a light brown. Followup hemoglobin levels, however, drifting to 7.1. He was placed on iron therapy which turned his stools dark green. Followup hemoglobin showed progressive anemia with hemoglobin of 6.4 for which he was hospitalized 03/06/2011 and transfused 2 units of packed red blood cells. They tell me that his posttransfusion hemoglobin was greater than 8. He denies any significant symptoms prior to receiving blood. Baby feels a bit stronger after receiving blood. He has continued on twice a day PPI. He is accompanied today by his wife and son. They reported compliance with medications. Her PPI is pantoprazole 40 mg twice a day. Current iron therapy is new iron 150 mg twice a day. His GI review of systems is negative. No GERD symptoms or dysphagia on PPI.  REVIEW OF SYSTEMS:  All non-GI ROS, including cardiopulmonary, negative.  Past Medical History  Diagnosis Date  . Internal hemorrhoids without mention of complication   . Stricture and stenosis of esophagus   .  Unspecified sinusitis (chronic)   . Allergic rhinitis   . Diverticulosis of colon (without mention of hemorrhage)   . Benign neoplasm of colon   . Calculus of bile duct without mention of cholecystitis or obstruction   . Other esophagitis   . Esophageal reflux   . Peptic ulcer, unspecified site, unspecified as acute or chronic, without mention of hemorrhage, perforation, or obstruction   . Other and unspecified hyperlipidemia   . Coronary atherosclerosis of unspecified type of vessel, native or graft   . Kidney stones   . Hypertension   . Myocardial infarction 09/1995  . Shortness of breath 03/06/11     "w/exertion"  . Pneumonia   . Diabetes mellitus     "pills"  . Stomach ulcer     "bleeding; found it 01/2011"  . Arthritis     Past Surgical History  Procedure Date  . Coronary angioplasty with stent placement 01/20/2011    "2"  . Coronary angioplasty with stent placement 1997    '1"  . Colonoscopy 01/26/11    severe left diverticulosis, internal hemorrhoids  . Esophagogastroduodenoscopy 01/26/11    esophageal stricture, antral ulcers - H. pylori bx negative    Social History Kevin Rhodes  reports that he has quit smoking. He has never used smokeless tobacco. He reports that he does not drink alcohol or use illicit drugs.  family history is not on file.  Allergies  Allergen Reactions  . Ambien (Zolpidem Tartrate) Other (See Comments)    Keeps awake        PHYSICAL EXAMINATION: Vital signs: BP 132/78  Pulse 68  Ht 5\' 8"  (1.727 m)  Wt 203 lb 3.2 oz (92.171 kg)  BMI 30.90 kg/m2  SpO2 98% General:  Well-developed, well-nourished, no acute distress HEENT: Sclerae are anicteric, conjunctiva pink. Oral mucosa intact Lungs: Clear Heart: Regular, 2/6 systolic murmur Abdomen: soft, mildly obese, nontender, nondistended, no obvious ascites, no peritoneal signs, normal bowel sounds. No organomegaly. Extremities: No edema Psychiatric: alert and oriented x3. Cooperative      ASSESSMENT:  #1. Recurrent anemia as outlined. Certainly a significant cause for his initial problems with anemia seems secondary to GI bleeding. More recently, agree anemia seems out of proportion with no obvious clinical bleeding. Question other contributors to anemia #2. Recent transient GI bleeding secondary to antral ulceration. Now on twice a day PPI. H. pylori negative. Continues on aspirin and Effient #3. Multiple significant medical problems including recent coronary artery stent placement, diabetes mellitus #4. GERD with asymptomatic (at this time) peptic stricture #5. History of adenomatous colon polyps. Most recent colonoscopy negative for recurrent neoplasia   PLAN:  #1. Continue twice a day PPI #2. Continue to avoid all NSAIDs except for therapeutic baby aspirin #3. Relook upper endoscopy in about 2 weeks to assess for gastric ulcer healing. The patient is high-risk given his multiple comorbidities.The nature of the procedure, as well as the risks, benefits, and alternatives were carefully and thoroughly reviewed with the patient. Ample time for discussion and questions allowed. The patient understood, was satisfied, and agreed to proceed. We will perform his exam with propofol and CRNA supervision given his high risk status #4. Hold insulin the morning of the procedure until by mouth intake resumed #5. Contact the office and/or report to the hospital for any obvious GI bleeding in the interim #6. If GI mucosa healed (and stools do not show evidence of blood) and anemia continues to return, consider hematology consultation. This, to assess for other potential contributors to anemia. In the interim, transfused when necessary as clinically indicated #7. Given his age, comorbidities, and lack of neoplasia on the most recent exam, no routine surveillance colonoscopy planned

## 2011-03-09 NOTE — Patient Instructions (Signed)
You have been scheduled for an endoscopy. Please follow written instructions given to you at your visit today.  

## 2011-03-10 ENCOUNTER — Ambulatory Visit (HOSPITAL_COMMUNITY): Payer: Medicare Other

## 2011-03-10 ENCOUNTER — Encounter (HOSPITAL_COMMUNITY): Payer: Medicare Other

## 2011-03-13 ENCOUNTER — Ambulatory Visit (HOSPITAL_COMMUNITY): Payer: Medicare Other

## 2011-03-13 ENCOUNTER — Telehealth (HOSPITAL_COMMUNITY): Payer: Self-pay | Admitting: *Deleted

## 2011-03-13 ENCOUNTER — Encounter (HOSPITAL_COMMUNITY): Admission: RE | Admit: 2011-03-13 | Payer: Medicare Other | Source: Ambulatory Visit

## 2011-03-13 NOTE — Telephone Encounter (Signed)
Called and spoke to pt.  Pt feels better after transfusion.  Pt will have colonoscopy in two weeks.  Pt plans to stay out of rehab for this week and return on December 2.

## 2011-03-15 ENCOUNTER — Ambulatory Visit (HOSPITAL_COMMUNITY): Payer: Medicare Other

## 2011-03-15 ENCOUNTER — Encounter (HOSPITAL_COMMUNITY): Payer: Medicare Other

## 2011-03-17 ENCOUNTER — Ambulatory Visit (HOSPITAL_COMMUNITY): Payer: Medicare Other

## 2011-03-20 ENCOUNTER — Ambulatory Visit (HOSPITAL_COMMUNITY): Payer: Medicare Other

## 2011-03-20 ENCOUNTER — Encounter (HOSPITAL_COMMUNITY): Payer: Medicare Other

## 2011-03-22 ENCOUNTER — Encounter (HOSPITAL_COMMUNITY): Payer: Medicare Other

## 2011-03-22 ENCOUNTER — Ambulatory Visit (HOSPITAL_COMMUNITY): Payer: Medicare Other

## 2011-03-24 ENCOUNTER — Ambulatory Visit (HOSPITAL_COMMUNITY): Payer: Medicare Other

## 2011-03-24 ENCOUNTER — Encounter (HOSPITAL_COMMUNITY): Payer: Medicare Other

## 2011-03-27 ENCOUNTER — Encounter (HOSPITAL_COMMUNITY): Payer: Medicare Other

## 2011-03-27 ENCOUNTER — Ambulatory Visit (HOSPITAL_COMMUNITY): Payer: Medicare Other

## 2011-03-27 ENCOUNTER — Other Ambulatory Visit: Payer: Self-pay | Admitting: Internal Medicine

## 2011-03-27 ENCOUNTER — Encounter: Payer: Self-pay | Admitting: Internal Medicine

## 2011-03-27 ENCOUNTER — Ambulatory Visit (AMBULATORY_SURGERY_CENTER): Payer: Medicare Other | Admitting: Internal Medicine

## 2011-03-27 VITALS — BP 159/74 | HR 56 | Temp 98.8°F | Resp 18 | Ht 68.0 in | Wt 203.0 lb

## 2011-03-27 DIAGNOSIS — K250 Acute gastric ulcer with hemorrhage: Secondary | ICD-10-CM

## 2011-03-27 DIAGNOSIS — D62 Acute posthemorrhagic anemia: Secondary | ICD-10-CM

## 2011-03-27 LAB — GLUCOSE, CAPILLARY
Glucose-Capillary: 128 mg/dL — ABNORMAL HIGH (ref 70–99)
Glucose-Capillary: 99 mg/dL (ref 70–99)

## 2011-03-27 MED ORDER — SODIUM CHLORIDE 0.9 % IV SOLN: 500.0000 mL | INTRAVENOUS | Status: DC

## 2011-03-27 NOTE — Patient Instructions (Signed)
Please refer to blue and green discharge instruction sheets. 

## 2011-03-27 NOTE — Progress Notes (Signed)
Patient did not experience any of the following events: a burn prior to discharge; a fall within the facility; wrong site/side/patient/procedure/implant event; or a hospital transfer or hospital admission upon discharge from the facility. (G8907) Patient did not have preoperative order for IV antibiotic SSI prophylaxis. ([id number])  

## 2011-03-28 ENCOUNTER — Telehealth: Payer: Self-pay

## 2011-03-28 NOTE — Telephone Encounter (Signed)
No ID on answering machine. 

## 2011-03-29 ENCOUNTER — Ambulatory Visit (HOSPITAL_COMMUNITY): Payer: Medicare Other

## 2011-03-29 ENCOUNTER — Other Ambulatory Visit: Payer: Medicare Other

## 2011-03-29 ENCOUNTER — Encounter (HOSPITAL_COMMUNITY): Payer: Medicare Other

## 2011-03-29 ENCOUNTER — Telehealth (HOSPITAL_COMMUNITY): Payer: Self-pay | Admitting: *Deleted

## 2011-03-29 DIAGNOSIS — K250 Acute gastric ulcer with hemorrhage: Secondary | ICD-10-CM

## 2011-03-29 NOTE — Progress Notes (Signed)
Addended by: Thereasa Solo A on: 03/29/2011 02:53 PM   Modules accepted: Orders

## 2011-03-31 ENCOUNTER — Encounter (HOSPITAL_COMMUNITY): Payer: Medicare Other

## 2011-03-31 ENCOUNTER — Ambulatory Visit (HOSPITAL_COMMUNITY): Payer: Medicare Other

## 2011-03-31 NOTE — Progress Notes (Signed)
Addended by: Thereasa Solo A on: 03/31/2011 02:41 PM   Modules accepted: Orders

## 2011-04-03 ENCOUNTER — Encounter (HOSPITAL_COMMUNITY): Payer: Medicare Other

## 2011-04-03 ENCOUNTER — Ambulatory Visit (HOSPITAL_COMMUNITY): Payer: Medicare Other

## 2011-04-04 ENCOUNTER — Other Ambulatory Visit: Payer: Self-pay | Admitting: Internal Medicine

## 2011-04-04 DIAGNOSIS — K250 Acute gastric ulcer with hemorrhage: Secondary | ICD-10-CM

## 2011-04-04 LAB — HELICOBACTER PYLORI SCREEN-BIOPSY: UREASE: NEGATIVE

## 2011-04-05 ENCOUNTER — Ambulatory Visit (HOSPITAL_COMMUNITY): Payer: Medicare Other

## 2011-04-05 ENCOUNTER — Encounter (HOSPITAL_COMMUNITY)
Admission: RE | Admit: 2011-04-05 | Discharge: 2011-04-05 | Disposition: A | Discharge status: Home / Self Care | Payer: Medicare Other | Source: Ambulatory Visit | Attending: Cardiovascular Disease | Admitting: Cardiovascular Disease

## 2011-04-05 ENCOUNTER — Telehealth: Payer: Self-pay | Admitting: Internal Medicine

## 2011-04-05 DIAGNOSIS — Z9861 Coronary angioplasty status: Secondary | ICD-10-CM | POA: Insufficient documentation

## 2011-04-05 DIAGNOSIS — Z5189 Encounter for other specified aftercare: Secondary | ICD-10-CM | POA: Insufficient documentation

## 2011-04-05 DIAGNOSIS — I200 Unstable angina: Secondary | ICD-10-CM | POA: Insufficient documentation

## 2011-04-05 DIAGNOSIS — E119 Type 2 diabetes mellitus without complications: Secondary | ICD-10-CM | POA: Insufficient documentation

## 2011-04-05 DIAGNOSIS — Z7982 Long term (current) use of aspirin: Secondary | ICD-10-CM | POA: Insufficient documentation

## 2011-04-05 DIAGNOSIS — R195 Other fecal abnormalities: Secondary | ICD-10-CM

## 2011-04-05 DIAGNOSIS — I252 Old myocardial infarction: Secondary | ICD-10-CM | POA: Insufficient documentation

## 2011-04-05 DIAGNOSIS — K219 Gastro-esophageal reflux disease without esophagitis: Secondary | ICD-10-CM | POA: Insufficient documentation

## 2011-04-05 DIAGNOSIS — I10 Essential (primary) hypertension: Secondary | ICD-10-CM | POA: Insufficient documentation

## 2011-04-05 DIAGNOSIS — Z79899 Other long term (current) drug therapy: Secondary | ICD-10-CM | POA: Insufficient documentation

## 2011-04-05 DIAGNOSIS — I2510 Atherosclerotic heart disease of native coronary artery without angina pectoris: Secondary | ICD-10-CM | POA: Insufficient documentation

## 2011-04-05 DIAGNOSIS — E785 Hyperlipidemia, unspecified: Secondary | ICD-10-CM | POA: Insufficient documentation

## 2011-04-05 NOTE — Telephone Encounter (Signed)
Called and spoke to daughter.  Daughter states that pt will return to exercise on Monday 04/03/11.

## 2011-04-05 NOTE — Telephone Encounter (Signed)
Recent EGD was negative. Also, had colonoscopy.He should come into office tomorrow for evaluation, stool check for blood, and cbc. See extender who should review case w/ me. If he feels that this is more acute or significantly symptomatic, then he needs to go to ER

## 2011-04-05 NOTE — Telephone Encounter (Signed)
Pt states that his stool has turned black again, started about 3-4- days ago. Pt states that his stomach feels uneasy like it did before and that he has no energy. Pt had EGD 03/27/11. [name] please advise.

## 2011-04-05 NOTE — Telephone Encounter (Signed)
Pt scheduled to see Willette Cluster NP tomorrow at 9am, pt to have CBC prior to office visit.

## 2011-04-06 ENCOUNTER — Encounter: Payer: Self-pay | Admitting: Nurse Practitioner

## 2011-04-06 ENCOUNTER — Telehealth: Payer: Self-pay | Admitting: *Deleted

## 2011-04-06 ENCOUNTER — Other Ambulatory Visit (INDEPENDENT_AMBULATORY_CARE_PROVIDER_SITE_OTHER): Payer: Medicare Other

## 2011-04-06 ENCOUNTER — Ambulatory Visit (INDEPENDENT_AMBULATORY_CARE_PROVIDER_SITE_OTHER): Payer: Medicare Other | Admitting: Nurse Practitioner

## 2011-04-06 DIAGNOSIS — I2510 Atherosclerotic heart disease of native coronary artery without angina pectoris: Secondary | ICD-10-CM

## 2011-04-06 DIAGNOSIS — D500 Iron deficiency anemia secondary to blood loss (chronic): Secondary | ICD-10-CM

## 2011-04-06 DIAGNOSIS — R195 Other fecal abnormalities: Secondary | ICD-10-CM

## 2011-04-06 LAB — CBC WITH DIFFERENTIAL/PLATELET
Basophils Absolute: 0 10*3/uL (ref 0.0–0.1)
Eosinophils Absolute: 0.3 10*3/uL (ref 0.0–0.7)
Hemoglobin: 7.8 g/dL — CL (ref 13.0–17.0)
Lymphocytes Relative: 27.3 % (ref 12.0–46.0)
MCHC: 31.8 g/dL (ref 30.0–36.0)
Monocytes Relative: 7.6 % (ref 3.0–12.0)
Neutro Abs: 2.8 10*3/uL (ref 1.4–7.7)
Neutrophils Relative %: 58.2 % (ref 43.0–77.0)
Platelets: 175 10*3/uL (ref 150.0–400.0)
RDW: 22 % — ABNORMAL HIGH (ref 11.5–14.6)

## 2011-04-06 NOTE — Telephone Encounter (Signed)
Called and left a message for Gunnar Fusi, daughter, and advised them we are still working on getting a time for the blood transfusion.  Gunnar Fusi will be discussing this with [name] and we will call the patient.

## 2011-04-06 NOTE — Progress Notes (Signed)
Kevin Rhodes [id number] 07/03/1933   HISTORY OR PRESENT ILLNESS : Patient is a 77 y.o. male with multiple medical problems including but not limited to : hypertension, diabetes, GERD/ peptic stricture, adenomatous colon polyps and CAD/CABG/stenting. He is on aspirin and Effient. Patient was hospitalized for subacute GI bleeding October 2012. He underwent colonoscopy and upper endoscopy. Complete colonoscopy revealed diverticulosis and hemorrhoids. Upper endoscopy revealed multiple clean-based antral ulcers. Helicobacter pylori was negative. He was continued on Effient and baby aspirin and treated with twice a day PPI.  Following discharge hemoglobin drifted to 7.1 and patient was started on oral iron. His hemoglobin continued to drift and he was hospialized 03/06/11 with hemoglobin of 6.4 for which he was given 2 units of blood. Patient saw [name] in follow up last month. A follow up EGD on 03/27/11 revealed a few tiny erosions in the antrum but ulcershad healed. A CLOtest was done and negative. Patient advised to decrease Protonix to once daily indefinitely and to avoid NSAIDs except for cardiac reasons. Patient called office yesterday with complaints of black stool, decreased energy and uneasy feeling stomach. In the office he is accompanied by family. Patient describes only dark stools, not black. He denies abdominal pain, nausea. Patient feels okay, no weakness, chest pain or SOB. Labs are pending.   Current Medications, Allergies, Past Medical History, Past Surgical History, Family History and Social History were reviewed in [name] record.  PHYSICAL EXAMINATION : General: Well developed  white male in no acute distress Head: Normocephalic and atraumatic Mouth: No deformity or lesions Neck: Supple, no masses.  Lungs: Clear throughout to auscultation Heart: Regular rate and rhythm; no murmurs heard Abdomen: Soft, nondistended, nontender. No masses or hepatomegaly  noted. Normal bowel sounds Rectal: Light to medium brown, heme positive stool Musculoskeletal: Symmetrical with no gross deformities  Skin: No lesions on visible extremities Extremities: No edema or deformities noted Neurological: Alert oriented x 4, grossly nonfocal Cervical Nodes:  No significant cervical adenopathy Psychological:  Alert and cooperative. Normal mood and affect  ASSESSMENT AND PLAN :

## 2011-04-07 ENCOUNTER — Ambulatory Visit (HOSPITAL_COMMUNITY): Payer: Medicare Other

## 2011-04-07 ENCOUNTER — Telehealth: Payer: Self-pay | Admitting: *Deleted

## 2011-04-07 ENCOUNTER — Encounter: Payer: Self-pay | Admitting: Nurse Practitioner

## 2011-04-07 ENCOUNTER — Encounter (HOSPITAL_COMMUNITY): Payer: Medicare Other

## 2011-04-07 DIAGNOSIS — D649 Anemia, unspecified: Secondary | ICD-10-CM

## 2011-04-07 NOTE — Assessment & Plan Note (Signed)
CAD - s/p recent LAD & RCA PCI. Followed by Dr. Allyson Sabal. On Effient and ASA for history of cardiac stenting

## 2011-04-07 NOTE — Telephone Encounter (Signed)
Spoke with patient today and told him that his clotest for h-pylori  Bacteria was negative.   He asked about when he was going to get blood.  I told him that he would be notified when it was ready.

## 2011-04-07 NOTE — Telephone Encounter (Signed)
Would u please put CBC in for Kevin Rhodes 08/03/1933 for Monday. thanks

## 2011-04-07 NOTE — Assessment & Plan Note (Addendum)
Hemoglobin this am 7.8, down from 8.3 on 03/07/11. Stool heme positive but medium brown in color. His ulcers had healed on 03/27/11 EGD. Hemoglobin drifting again on ASA and Effient, despite twice daily iron. Colonoscopy in October 2012 was unrevealing. Patient may need small bowel video capsule study to look for any other sources of chronic GI blood loss, will defer to patient's primary gastroenterologist, [name]. For now will arrange for 2 units of PRBC as outpatient. Continue twice daily iron, cautioned about potential to cause constipation.

## 2011-04-09 NOTE — Progress Notes (Signed)
Reviewed and agree with management. Briellah Baik T. Kasim Mccorkle MD FACG 

## 2011-04-10 ENCOUNTER — Other Ambulatory Visit: Payer: Self-pay | Admitting: *Deleted

## 2011-04-10 ENCOUNTER — Ambulatory Visit (HOSPITAL_COMMUNITY): Payer: Medicare Other

## 2011-04-10 ENCOUNTER — Encounter (HOSPITAL_COMMUNITY): Payer: Medicare Other

## 2011-04-10 ENCOUNTER — Other Ambulatory Visit (INDEPENDENT_AMBULATORY_CARE_PROVIDER_SITE_OTHER): Payer: Medicare Other

## 2011-04-10 DIAGNOSIS — D649 Anemia, unspecified: Secondary | ICD-10-CM

## 2011-04-10 DIAGNOSIS — D500 Iron deficiency anemia secondary to blood loss (chronic): Secondary | ICD-10-CM

## 2011-04-10 LAB — CBC WITH DIFFERENTIAL/PLATELET
Basophils Absolute: 0 10*3/uL (ref 0.0–0.1)
Basophils Relative: 0.3 % (ref 0.0–3.0)
HCT: 24.6 % — ABNORMAL LOW (ref 39.0–52.0)
Hemoglobin: 8 g/dL — CL (ref 13.0–17.0)
Lymphs Abs: 1.3 10*3/uL (ref 0.7–4.0)
Monocytes Relative: 7.8 % (ref 3.0–12.0)
Neutro Abs: 3.2 10*3/uL (ref 1.4–7.7)
RDW: 22 % — ABNORMAL HIGH (ref 11.5–14.6)

## 2011-04-10 NOTE — Telephone Encounter (Signed)
Advised daughter of appointments this week for Type & Cross at [facility] Stay on 04-12-2011 at 9 AM. He also  has a cardiology rehab appt at [facility] Cardiology rehab at 11:15 Am omn the 19th  And will come to our office at 2 PM for the Diagnostic teaching for the Capsule Endosocpy scheduled for 04-21-2011 at 8 AM.     The daughter was driving and wanted me to call her at 3 PM today and I will do that so she can write everything down. She thanked me for scheduling several things on the same day the 19th since they drive from [place], [place].

## 2011-04-10 NOTE — Telephone Encounter (Signed)
Spoke to [place] , daughter of patient.  Let her know his HGB today is 8.0.  I did get the type and cross appointment scheduled for Wed 04-12-2011 at 9:[facility]. Also scheduled the blood transfusion for 04-13-2011 Thurs and he is to arrive at [facility] short stay at 7:45 AM and his appt is for 8:00 AM.  The appointments are in Epic for these two dates. Daughter Gunnar Fusi, is advised of these times and appointments. Also advised her Dr Marina Goodell thinks her father should have Capsule Endoscopy. I scheduled the teaching for this on 04-20-2011 at 1:30 PM here in this office, 3rd floor.

## 2011-04-11 NOTE — Telephone Encounter (Signed)
I discussed with Gunnar Fusi, daughter of patient, that on 04-12-2011 they go to [facility] Short Stay for the Type & Cross procedure at 9:00 AM.  I told her about the appt at 2 Pm for the diagnostic teaching of the Endo capsule and the daugher said he will not be going to the Cardiac rehab appt at 11:15 and asked if they could come sooner on the 19th for the teaching in our office.  I asked Darcey Nora RN and she said the patient can come at 10:30 AM or as close to that as he can for the Diagnostic capsule endoscopy teaching.  He will then go to [facility] Stay on 04-13-11 Thursday and needs to arrive at 7:45 AM for the blood transfusion. ( 2 units of PRB cells).  He also has an appointment on 04-21-2011 for the capsule endo at 8:00 AM.  Daughter aware he has to be off his iron supplement 7 days before the capsule endo on the 28 th.

## 2011-04-12 ENCOUNTER — Encounter (HOSPITAL_COMMUNITY): Payer: Medicare Other

## 2011-04-12 ENCOUNTER — Ambulatory Visit (HOSPITAL_COMMUNITY): Payer: Medicare Other

## 2011-04-12 ENCOUNTER — Ambulatory Visit (INDEPENDENT_AMBULATORY_CARE_PROVIDER_SITE_OTHER): Payer: Medicare Other | Admitting: Internal Medicine

## 2011-04-12 ENCOUNTER — Encounter (HOSPITAL_COMMUNITY)
Admission: RE | Admit: 2011-04-12 | Discharge: 2011-04-12 | Disposition: A | Discharge status: Home / Self Care | Payer: Medicare Other | Source: Ambulatory Visit | Attending: Internal Medicine | Admitting: Internal Medicine

## 2011-04-12 DIAGNOSIS — D500 Iron deficiency anemia secondary to blood loss (chronic): Secondary | ICD-10-CM

## 2011-04-12 NOTE — Progress Notes (Signed)
Patient here for capsule endo teaching Verbalized understanding of all written and verbal instructions 

## 2011-04-13 ENCOUNTER — Encounter (HOSPITAL_COMMUNITY)
Admission: RE | Admit: 2011-04-13 | Discharge: 2011-04-13 | Disposition: A | Discharge status: Home / Self Care | Payer: Medicare Other | Source: Ambulatory Visit | Attending: Internal Medicine | Admitting: Internal Medicine

## 2011-04-14 ENCOUNTER — Encounter (HOSPITAL_COMMUNITY): Payer: Medicare Other

## 2011-04-14 ENCOUNTER — Ambulatory Visit (HOSPITAL_COMMUNITY): Payer: Medicare Other

## 2011-04-14 LAB — TYPE AND SCREEN
ABO/RH(D): A POS
Antibody Screen: NEGATIVE
Unit division: 0
Unit division: 0

## 2011-04-17 ENCOUNTER — Ambulatory Visit (HOSPITAL_COMMUNITY): Payer: Medicare Other

## 2011-04-19 ENCOUNTER — Ambulatory Visit (HOSPITAL_COMMUNITY): Payer: Medicare Other

## 2011-04-19 ENCOUNTER — Encounter (HOSPITAL_COMMUNITY): Payer: Medicare Other

## 2011-04-21 ENCOUNTER — Encounter (HOSPITAL_COMMUNITY): Payer: Medicare Other

## 2011-04-21 ENCOUNTER — Ambulatory Visit (HOSPITAL_COMMUNITY): Payer: Medicare Other

## 2011-04-24 ENCOUNTER — Ambulatory Visit (HOSPITAL_COMMUNITY): Payer: Medicare Other

## 2011-04-24 ENCOUNTER — Encounter (HOSPITAL_COMMUNITY): Payer: Medicare Other

## 2011-04-26 ENCOUNTER — Ambulatory Visit (HOSPITAL_COMMUNITY): Payer: Medicare Other

## 2011-04-26 ENCOUNTER — Encounter (HOSPITAL_COMMUNITY): Payer: Medicare Other

## 2011-04-28 ENCOUNTER — Ambulatory Visit (HOSPITAL_COMMUNITY): Payer: Medicare Other

## 2011-04-28 ENCOUNTER — Encounter (HOSPITAL_COMMUNITY): Payer: Medicare Other

## 2011-05-01 ENCOUNTER — Encounter (HOSPITAL_COMMUNITY): Payer: Medicare Other

## 2011-05-01 ENCOUNTER — Ambulatory Visit (HOSPITAL_COMMUNITY): Payer: Medicare Other

## 2011-05-01 NOTE — Progress Notes (Addendum)
Cardiac Rehabilitation Program Progress Report   Orientation:  02/23/2011 Discharge Date:  04/14/2011 # of sessions completed: 2/36  Cardiologist: Allyson Sabal Family MD:   Class Time:  1115  A.  Exercise Program:  Tolerates exercise @ 0.86 METS for 30 minutes, Bike Test Results:  Pre: 0.86 miles and Discharged  B.  Mental Health:  QOL scores were all >26  C.  Education/Instruction/Skills  Attended 0 education classes    D.  Nutrition/Weight Control/Body Composition: No information available.   *This section completed by Mickle Plumb, Andres Shad, RD, LDN, CDE  E.  Blood Lipids    Lab Results  Component Value Date   CHOL 127 03/07/2011     Lab Results  Component Value Date   TRIG 199* 03/07/2011     Lab Results  Component Value Date   HDL 28* 03/07/2011     Lab Results  Component Value Date   CHOLHDL 4.5 03/07/2011     No results found for this basename: LDLDIRECT      F.  Lifestyle Changes:  Unable to assess with short term participation   G.  Symptoms noted with exercise:  Asymptomatic  Report Completed By:  Hazle Nordmann   Comments:  Pt dropped after completing two session for personal reasons.  At discharge pt rhythm was sinus, sinus brady with PVCs.  Pt never reviewed home exercise with [place]. Thanks for the referral. Fabio Pierce, MA, ACSM RCEP  I agree with the above assessment.  Unable to reconcile medications with patient due to the limited number of sessions, pt was discharged due to medical issues. Karlene Lineman RN

## 2011-05-02 ENCOUNTER — Telehealth: Payer: Self-pay | Admitting: Internal Medicine

## 2011-05-02 NOTE — Telephone Encounter (Signed)
HE NEEDS TO RESCHEDULE WHEN HE'S FEELING BETTER

## 2011-05-02 NOTE — Telephone Encounter (Signed)
Dr Marina Goodell patient has canceled his capsule endo.

## 2011-05-02 NOTE — Telephone Encounter (Signed)
I have left a message with the patient that I have canceled the appt.  He is asked to call back to reschedule Capsule Endo when he is feeling better.

## 2011-05-03 ENCOUNTER — Encounter (HOSPITAL_COMMUNITY): Payer: Medicare Other

## 2011-05-03 ENCOUNTER — Ambulatory Visit (HOSPITAL_COMMUNITY): Payer: Medicare Other

## 2011-05-05 ENCOUNTER — Encounter (HOSPITAL_COMMUNITY): Payer: Medicare Other

## 2011-05-05 ENCOUNTER — Ambulatory Visit (HOSPITAL_COMMUNITY): Payer: Medicare Other

## 2011-05-08 ENCOUNTER — Encounter (HOSPITAL_COMMUNITY): Payer: Medicare Other

## 2011-05-08 ENCOUNTER — Ambulatory Visit (HOSPITAL_COMMUNITY): Payer: Medicare Other

## 2011-05-10 ENCOUNTER — Encounter (HOSPITAL_COMMUNITY): Payer: Medicare Other

## 2011-05-10 ENCOUNTER — Ambulatory Visit (HOSPITAL_COMMUNITY): Payer: Medicare Other

## 2011-05-12 ENCOUNTER — Ambulatory Visit (HOSPITAL_COMMUNITY): Payer: Medicare Other

## 2011-05-12 ENCOUNTER — Encounter (HOSPITAL_COMMUNITY): Payer: Medicare Other

## 2011-05-15 ENCOUNTER — Encounter (HOSPITAL_COMMUNITY): Payer: Medicare Other

## 2011-05-15 ENCOUNTER — Ambulatory Visit (HOSPITAL_COMMUNITY): Payer: Medicare Other

## 2011-05-17 ENCOUNTER — Ambulatory Visit (HOSPITAL_COMMUNITY): Payer: Medicare Other

## 2011-05-17 ENCOUNTER — Encounter (HOSPITAL_COMMUNITY): Payer: Medicare Other

## 2011-05-17 NOTE — Telephone Encounter (Signed)
Called pt to see if he was ready to schedule the capsule endo. Pt states he still is not feeling "up to par" and wants to wait a few more days. Let pt know I would call him back next week to schedule.

## 2011-05-19 ENCOUNTER — Ambulatory Visit (HOSPITAL_COMMUNITY): Payer: Medicare Other

## 2011-05-19 ENCOUNTER — Encounter (HOSPITAL_COMMUNITY): Payer: Medicare Other

## 2011-05-22 ENCOUNTER — Encounter (HOSPITAL_COMMUNITY): Payer: Medicare Other

## 2011-05-22 ENCOUNTER — Ambulatory Visit (HOSPITAL_COMMUNITY): Payer: Medicare Other

## 2011-05-24 ENCOUNTER — Encounter (HOSPITAL_COMMUNITY): Payer: Medicare Other

## 2011-05-24 ENCOUNTER — Ambulatory Visit (HOSPITAL_COMMUNITY): Payer: Medicare Other

## 2011-05-26 ENCOUNTER — Encounter (HOSPITAL_COMMUNITY): Payer: Medicare Other

## 2011-05-26 ENCOUNTER — Ambulatory Visit (HOSPITAL_COMMUNITY): Payer: Medicare Other

## 2011-05-29 ENCOUNTER — Ambulatory Visit (HOSPITAL_COMMUNITY): Payer: Medicare Other

## 2011-05-29 ENCOUNTER — Encounter (HOSPITAL_COMMUNITY): Payer: Medicare Other

## 2011-05-30 NOTE — Telephone Encounter (Signed)
Left message for pt to call back  °

## 2011-05-31 ENCOUNTER — Ambulatory Visit (HOSPITAL_COMMUNITY): Payer: Medicare Other

## 2011-05-31 ENCOUNTER — Encounter (HOSPITAL_COMMUNITY): Payer: Medicare Other

## 2011-05-31 NOTE — Telephone Encounter (Signed)
Called pt, received message that states memory is full and to enter remote access code.

## 2011-06-02 ENCOUNTER — Ambulatory Visit (HOSPITAL_COMMUNITY): Payer: Medicare Other

## 2011-06-02 ENCOUNTER — Encounter (HOSPITAL_COMMUNITY): Payer: Medicare Other

## 2011-06-02 NOTE — Telephone Encounter (Signed)
Left message for pt to call back  °

## 2011-06-05 ENCOUNTER — Ambulatory Visit (HOSPITAL_COMMUNITY): Payer: Medicare Other

## 2011-06-05 NOTE — Telephone Encounter (Signed)
Yes. Send a letter requesting that he schedule his capsule endoscopy it is near his convenience.

## 2011-06-05 NOTE — Telephone Encounter (Signed)
Letter mailed to pt to reschedule the appt.

## 2011-06-05 NOTE — Telephone Encounter (Signed)
Have tried multiple times to contact pt to reschedule the capsule endoscopy. [name] is it ok to send the patient a letter to reschedule the procedure? Please advise.

## 2011-06-07 ENCOUNTER — Ambulatory Visit (HOSPITAL_COMMUNITY): Payer: Medicare Other

## 2011-06-09 ENCOUNTER — Ambulatory Visit (HOSPITAL_COMMUNITY): Payer: Medicare Other

## 2011-06-12 ENCOUNTER — Ambulatory Visit (HOSPITAL_COMMUNITY): Payer: Medicare Other

## 2011-06-14 ENCOUNTER — Ambulatory Visit (HOSPITAL_COMMUNITY): Payer: Medicare Other

## 2011-06-16 ENCOUNTER — Ambulatory Visit (HOSPITAL_COMMUNITY): Payer: Medicare Other

## 2011-06-19 ENCOUNTER — Ambulatory Visit (HOSPITAL_COMMUNITY): Payer: Medicare Other

## 2011-06-21 ENCOUNTER — Ambulatory Visit (HOSPITAL_COMMUNITY): Payer: Medicare Other

## 2011-06-23 ENCOUNTER — Ambulatory Visit (HOSPITAL_COMMUNITY): Payer: Medicare Other

## 2011-06-26 ENCOUNTER — Ambulatory Visit (HOSPITAL_COMMUNITY): Payer: Medicare Other

## 2011-06-28 ENCOUNTER — Ambulatory Visit (HOSPITAL_COMMUNITY): Payer: Medicare Other

## 2011-06-29 ENCOUNTER — Telehealth: Payer: Self-pay | Admitting: Internal Medicine

## 2011-06-29 NOTE — Telephone Encounter (Signed)
Pt scheduled to see [name] PA tomorrow at 2pm. Tiffany to notify pt of appt date and time. Tiffany to fax records.

## 2011-06-30 ENCOUNTER — Ambulatory Visit (INDEPENDENT_AMBULATORY_CARE_PROVIDER_SITE_OTHER): Payer: Medicare Other | Admitting: Physician Assistant

## 2011-06-30 ENCOUNTER — Encounter: Payer: Self-pay | Admitting: Physician Assistant

## 2011-06-30 ENCOUNTER — Ambulatory Visit (HOSPITAL_COMMUNITY): Payer: Medicare Other

## 2011-06-30 ENCOUNTER — Other Ambulatory Visit (INDEPENDENT_AMBULATORY_CARE_PROVIDER_SITE_OTHER): Payer: Medicare Other

## 2011-06-30 DIAGNOSIS — Z732 Lack of relaxation and leisure: Secondary | ICD-10-CM

## 2011-06-30 DIAGNOSIS — D649 Anemia, unspecified: Secondary | ICD-10-CM

## 2011-06-30 DIAGNOSIS — R198 Other specified symptoms and signs involving the digestive system and abdomen: Secondary | ICD-10-CM

## 2011-06-30 DIAGNOSIS — K4020 Bilateral inguinal hernia, without obstruction or gangrene, not specified as recurrent: Secondary | ICD-10-CM | POA: Insufficient documentation

## 2011-06-30 DIAGNOSIS — Z87442 Personal history of urinary calculi: Secondary | ICD-10-CM

## 2011-06-30 DIAGNOSIS — R319 Hematuria, unspecified: Secondary | ICD-10-CM

## 2011-06-30 DIAGNOSIS — R109 Unspecified abdominal pain: Secondary | ICD-10-CM

## 2011-06-30 DIAGNOSIS — M545 Low back pain: Secondary | ICD-10-CM

## 2011-06-30 DIAGNOSIS — R935 Abnormal findings on diagnostic imaging of other abdominal regions, including retroperitoneum: Secondary | ICD-10-CM

## 2011-06-30 DIAGNOSIS — K7460 Unspecified cirrhosis of liver: Secondary | ICD-10-CM

## 2011-06-30 DIAGNOSIS — Z7289 Other problems related to lifestyle: Secondary | ICD-10-CM

## 2011-06-30 LAB — HEPATIC FUNCTION PANEL
Albumin: 3.5 g/dL (ref 3.5–5.2)
Total Protein: 6.6 g/dL (ref 6.0–8.3)

## 2011-06-30 LAB — IRON: Iron: 69 ug/dL (ref 42–165)

## 2011-06-30 MED ORDER — HYDROCODONE-ACETAMINOPHEN 5-325 MG PO TABS
ORAL_TABLET | ORAL | Status: DC
Start: 2011-06-30 — End: 2011-09-07

## 2011-06-30 NOTE — Progress Notes (Signed)
Subjective:    Patient ID: Kevin Rhodes, male    DOB: [birth year], 78 y.o.   MRN: [id number]  HPI Kevin Rhodes is a very nice 78 year old white male known to Dr. Yancey Flemings who had undergone workup in the fall for heme positive stool in her deficiency anemia. He had colonoscopy in October 2012 showed moderate diverticulosis of the left colon internal hemorrhoids and otherwise was negative exam. Upper endoscopy at that time showed a gastric ulcer. U. underwent followup EGD in December of 2012 healing of the ulcer. There were tiny erosions noted in the antrum he had a normal appearing esophagus.  Following that in December he developed dark stools, and a recurrent anemia with hemoglobin down to the 7 range This is in the setting of ongoing aspirin and Effient use. He was transfused 2 units of packed RBCs and was scheduled for a capsule endoscopy which he never completed. Since that time he says he felt better after the transfusions and to Dr. Yehuda Rhodes has been following his blood counts, his hemoglobin went back up and he decided not to pursue the capsule endoscopy. He remains on an iron supplement and says that his stools have been normal appearing recently. His last hemoglobin was checked on 06/27/2011 and was 12.2.  He comes in now for evaluation of a two-week history of right back and flank pain as well as abnormal findings on CT scan of the abdomen and pelvis. Patient says that he has been hurting over the past 2 weeks in his right mid back and this seems only to occur when he lies down He says he feels fine during the day if he is up and moving but after 1 hour of lying down at night his back starts  hurting in a dull aching stabbing fashion and he has to get up and try to sleep in in a recliner. Has not had any known injury and his pain is not aggravated by any other position and movement et cetera. He has been using some hydrocodone at night to help him sleep because he is quite uncomfortable. He has also  noted that his urine looks a bit darker and has a foul odor just over the past week.He has Not had any fever, no complaints of abdominal pain no diarrhea nausea vomiting etc.  He underwent upper abdominal ultrasound on 06/22/2011 cause of this pain was found to have a 2.5 cm right renal cyst and a prominent Marina Goodell splenic vein otherwise negative exam. Subsequent CT of the abdomen and pelvis on 06/27/2011 shows evidence of a cirrhotic liver with focal dilation of the collateral vessel to 2.2 cm a subcentimeter hepatic hypodensities too small to characterize likely small cysts and evidence of pneumobilia likely related to prior instrumentation. He is nonobstructive small renal calculi bilaterally and a massive left inguinal hernia containing fat and a small right inguinal hernia containing fat but no bowel.  UA done on 3/5 at Dr. Yehuda Rhodes office was negative with the exception of a small amount of blood. Patient says he cannot continue to go along with this right back and flank pain because he's hurting all night,everynight for the past 2 weeks    Review of Systems  Constitutional: Positive for fatigue.  HENT: Negative.   Eyes: Negative.   Respiratory: Negative.   Cardiovascular: Negative.   Gastrointestinal: Positive for abdominal pain.  Genitourinary: Positive for dysuria and flank pain.  Musculoskeletal: Positive for back pain.  Neurological: Negative.   Hematological: Negative.   Psychiatric/Behavioral:  Negative.    Outpatient Prescriptions Prior to Visit  Medication Sig Dispense Refill  . amLODipine (NORVASC) 5 MG tablet Take 5 mg by mouth daily after supper.        Marland Kitchen aspirin 81 MG chewable tablet Chew 81 mg by mouth at bedtime.        . hydrochlorothiazide (MICROZIDE) 12.5 MG capsule Take 12.5 mg by mouth every other day.        . irbesartan (AVAPRO) 300 MG tablet Take 300 mg by mouth at bedtime.        . isosorbide mononitrate (IMDUR) 30 MG 24 hr tablet       . metFORMIN (GLUCOPHAGE)  500 MG tablet Take 500 mg by mouth 2 (two) times daily with a meal.        . metoprolol (LOPRESSOR) 50 MG tablet Take 50 mg by mouth 2 (two) times daily.        Marland Kitchen omega-3 acid ethyl esters (LOVAZA) 1 G capsule Take 2 g by mouth 2 (two) times daily.        . pantoprazole (PROTONIX) 40 MG tablet Take 40 mg by mouth daily.       . polysaccharide iron (NIFEREX) 150 MG CAPS capsule Take 150 mg by mouth 2 (two) times daily.        . prasugrel (EFFIENT) 10 MG TABS Take 10 mg by mouth daily.        . simvastatin (ZOCOR) 40 MG tablet Take 40 mg by mouth at bedtime.         Facility-Administered Medications Prior to Visit  Medication Dose Route Frequency Provider Last Rate Last Dose  . 0.9 %  sodium chloride infusion  500 mL Intravenous Continuous Hilarie Fredrickson, MD       Allergies  Allergen Reactions  . Ambien (Zolpidem Tartrate) Other (See Comments)    Keeps awake    Patient Active Problem List  Diagnoses  . COLONIC POLYPS  . HYPERLIPIDEMIA  . CORONARY ARTERY DISEASE  . HEMORRHOIDS, INTERNAL  . SINUSITIS  . ALLERGIC RHINITIS, SEASONAL  . EROSIVE ESOPHAGITIS  . ESOPHAGEAL STRICTURE  . GERD  . PEPTIC ULCER DISEASE  . DIVERTICULOSIS, COLON  . CHOLEDOCHOLITHIASIS  . Anemia due to blood loss, chronic  . Angina at rest  . Cirrhosis  . Inguinal hernia, bilateral       Objective:   Physical Exam well-developed older white male in no acute distress, accompanied by his son. Blood pressure 120/80 pulse 52 weight 198. HEENT; nontraumatic normocephalic EOMI PERRLA sclera anicteric, Neck is supple no JVD, Cardiovascula;r regular rate and rhythm with S1-S2 he does have a 4/6 systolic murmur, Pulmonary; clear bilaterally, Abdomen; soft, bowel sounds are active there is no palpable mass or hepatosplenomegaly he really is nontender to palpation of his abdomen nontender over the right costal margin, large right inguinal hernia. Back; no CVA tenderness and no tenderness to palpation over the right and flank  no ecchymosis or other lesions noted   Rectal; not done, Extremities; no clubbing cyanosis or edema skin warm and dry, Psych; mood and affect normal and appropriate       Assessment & Plan:  #66 78 year old white male with recent history of iron deficiency anemia and heme positive stool , with transfusion requirement December 2013. Etiology of his blood loss is not clear. He had had a gastric ulcer which had healed on followup endoscopy in December of 2013. He may have a small bowel source for his blood loss i.e.  AVMs however he has not completed he suggested capsule endoscopy and his hemoglobin is currently much improved and stable.  #2 chronic anticoagulation with Effient and aspirin  #3 diagnosis of cirrhosis found on recent CT abdomen and pelvis he also has focal dilation of a collateral vessel which is likely a varix .  His liver appears to be compensated at this time with normal ProTime INR normal platelet count and no evidence for esophageal varices or portal gastropathy on recent EGD. Etiology of the cirrhosis is unclear he has no history of EtOH use, he may have Elita Boone.  #4  2 week history of right back and flank pain. I cannot explain his right back pain by any of the findings on CT scan did not feel that this is likely upper GI etiology. He does have history of kidney stones and has evidence of bilateral nonobstructing stones on the CT scan and also blood in his urine. I am concerned that his current pain may be related to his right kidney, the right renal cyst or ureterolithiasis.  Plan; Discussed  the findings on CT scan in detail with the patient and his son, we discussed the cirrhosis and I advised him that this appeared to be compensated at this time and would likely be slowly progressive. Will check chronic hepatitis serologies and other chronic hepatic markers including alpha-fetoprotein level Will obtain urology appointment for the patient for next week Vicodin 5/325 one to one  and a half every 4-6 hours as needed for pain Followup with [name] in 3-4 weeks to review liver tests, and discuss cirrhosis further Will hold off on capsule endoscopy at this time as he has not had any recent evidence of bleeding

## 2011-06-30 NOTE — Patient Instructions (Signed)
Please go to the basement level to have your labs drawn.   We have given you a prescription to take to your pharmacy for pain.  We faxed it to [facility] [address]  We will be calling [facility] on Monday 07-03-2011 AM to get you an urgent appointment with the MD on call.

## 2011-07-01 LAB — HEPATITIS C ANTIBODY: HCV Ab: NEGATIVE

## 2011-07-01 LAB — HEPATITIS B SURFACE ANTIGEN: Hepatitis B Surface Ag: NEGATIVE

## 2011-07-01 LAB — ALPHA-1-ANTITRYPSIN: A-1 Antitrypsin, Ser: 124 mg/dL (ref 90–200)

## 2011-07-03 ENCOUNTER — Telehealth: Payer: Self-pay | Admitting: *Deleted

## 2011-07-03 LAB — ANTI-NUCLEAR AB-TITER (ANA TITER): ANA Titer 1: NEGATIVE

## 2011-07-03 LAB — ANA: Anti Nuclear Antibody(ANA): POSITIVE — AB

## 2011-07-03 NOTE — Telephone Encounter (Signed)
The patient was outside.  The daughter called to him in the yard about [place] getting him an appointment today at [facility] at 1 PM today.  He is to arrive at 12:45PM.  That appointment was fine with him and he thanked [place] for calling and getting him that appointment.

## 2011-07-03 NOTE — Progress Notes (Signed)
Reviewed and agree with management. Zafirah Vanzee D. Delrico Minehart, M.D., FACG  

## 2011-07-05 ENCOUNTER — Telehealth: Payer: Self-pay | Admitting: Internal Medicine

## 2011-07-05 ENCOUNTER — Telehealth: Payer: Self-pay | Admitting: *Deleted

## 2011-07-05 NOTE — Telephone Encounter (Signed)
I called and left a message for the patient to advise we made a follow up appointment for him to see Dr. Yancey Flemings on 08-03-2011 @ 10:00 AM. I left my name and number in case he had any questions or needed to change this appointment.

## 2011-07-05 NOTE — Telephone Encounter (Signed)
Son states the pt is having pain in his right side. States that he is not sleeping and that the pain pills are not helping him now. Offered them an appt with [name] 07/10/11 but they request he be seen Thursday. Pt scheduled to see Willette Cluster NP tomorrow at 10:30am. Pt aware of appt date and time.

## 2011-07-06 ENCOUNTER — Telehealth: Payer: Self-pay | Admitting: *Deleted

## 2011-07-06 ENCOUNTER — Encounter: Payer: Self-pay | Admitting: Nurse Practitioner

## 2011-07-06 ENCOUNTER — Ambulatory Visit (INDEPENDENT_AMBULATORY_CARE_PROVIDER_SITE_OTHER): Payer: Medicare Other | Admitting: Nurse Practitioner

## 2011-07-06 VITALS — BP 158/82 | HR 63 | Ht 68.0 in | Wt 198.4 lb

## 2011-07-06 DIAGNOSIS — M545 Low back pain, unspecified: Secondary | ICD-10-CM

## 2011-07-06 DIAGNOSIS — R109 Unspecified abdominal pain: Secondary | ICD-10-CM

## 2011-07-06 MED ORDER — CYCLOBENZAPRINE HCL 10 MG PO TABS: ORAL_TABLET | ORAL | Status: DC

## 2011-07-06 NOTE — Telephone Encounter (Signed)
I called the patient and he didn't answer so I called Jennye Boroughs ( grandaughter) and gave her the date of the MRI , location [facility] building.  Date is Friday 07-07-2011 and the time is 7:00 PM.  He is to arrive at 6:45 PM.  He does not have to hold any food or water.  [facility], Dr. Clayborne Dana office faxed [place] the stent information from 12/2010.  I faxed this info to MRI department and they concluded that it was safe for him to have the MRI. I informed the patient's grandaughter.

## 2011-07-06 NOTE — Progress Notes (Signed)
Kevin Rhodes [id number] 11-Jun-1933   HISTORY OR PRESENT ILLNESS : Patient is a 78 year old male known to Dr. Marina Rhodes for a history of iron deficiency anemia, diverticulosis, and peptic ulcer disease. Further details regarding iron deficiency workup can be found in our last office note by Kevin Rhodes, P.A. dated 06/30/11.  Patient was seen here on that day for a two-week history of right back and right flank pain. There was concern for a urological source of pain and patient was referred to urology. Patient is back in today, accompanied by his son, apparently the urology workup was negative. Patient comes back here, he continues to have pain and does not know where else to go. The patient describes the pain as being in his right back and right flank area. Pain only present when he is in supine position, not present when sitting or moving around during the day. He has right lower extremity pain.  Current Medications, Allergies, Past Medical History, Past Surgical History, Family History and Social History were reviewed in [name] record.   PHYSICAL EXAMINATION : General:  White male in no acute distress Head: Normocephalic and atraumatic Eyes:  sclerae anicteric,conjunctive pink. Ears: Normal auditory acuity Neck: Supple, no masses.  Lungs: Clear throughout to auscultation Heart: Regular rate and rhythm Abdomen: Soft, nondistended, nontender. No masses or hepatomegaly noted. Normal bowel sounds Rectal: not done Musculoskeletal: Symmetrical with no gross deformities. Negative right raised straight leg test. No tenderness over lower spine or on right rib cage.   Skin: No lesions on visible extremities Extremities: No edema or deformities noted Neurological: Oriented , grossly nonfocal Cervical Nodes:  No significant cervical adenopathy Psychological:  Alert and cooperative. Normal mood and affect  ASSESSMENT AND PLAN :  1. three week history of right back and  right flank pain, urology evaluated the patient but did not feel pain was urologic in nature. CT scan of the abdomen and pelvis was unrevealing. The pain is only present when patient is in supine position. This pain was reproduced in the office today after patient had been lying flat on the examining table for a few minutes. Pain does not seem gastrointestinal in nature. Will obtain an MRI of the thoracic lumbar spine for further evaluation. In the meantime we will try a low dose of flexeril.   2. Cirrhosis by recent CT scan, new finding. Recent liver function studies normal. AFP is normal. Hepatitis B and C serologies were negative.  Genetic and autoimmune markers of liver disease obtained at last visit. Those results can be discussed when patient sees Dr. Marina Rhodes in follow up next month.  Patient will need variceal screening, especially since he is on chronic antiplatelet therapy.     3. Iron deficiency anemia, hemoccult positive stool. Patient supposed to have a capsule study at some point but that has been on hold secondary to workup for #1.

## 2011-07-06 NOTE — Patient Instructions (Signed)
We sent a prescription to [facility] [address] for the Flexeril.  We will be calling you back with an appointment for the MRI.

## 2011-07-07 ENCOUNTER — Ambulatory Visit (HOSPITAL_COMMUNITY)
Admission: RE | Admit: 2011-07-07 | Discharge: 2011-07-07 | Disposition: A | Discharge status: Home / Self Care | Payer: Medicare Other | Source: Ambulatory Visit | Attending: Nurse Practitioner | Admitting: Nurse Practitioner

## 2011-07-07 ENCOUNTER — Encounter: Payer: Self-pay | Admitting: Nurse Practitioner

## 2011-07-07 ENCOUNTER — Other Ambulatory Visit: Payer: Self-pay | Admitting: Nurse Practitioner

## 2011-07-07 DIAGNOSIS — M545 Low back pain, unspecified: Secondary | ICD-10-CM

## 2011-07-07 DIAGNOSIS — M79609 Pain in unspecified limb: Secondary | ICD-10-CM | POA: Insufficient documentation

## 2011-07-07 DIAGNOSIS — R109 Unspecified abdominal pain: Secondary | ICD-10-CM

## 2011-07-07 DIAGNOSIS — M47817 Spondylosis without myelopathy or radiculopathy, lumbosacral region: Secondary | ICD-10-CM | POA: Insufficient documentation

## 2011-07-07 LAB — CREATININE, SERUM
Creatinine, Ser: 1.24 mg/dL (ref 0.50–1.35)
GFR calc Af Amer: 64 mL/min — ABNORMAL LOW (ref >90)

## 2011-07-10 NOTE — Progress Notes (Signed)
Reviewed and agree with management. Karenna Romanoff D. Jona Erkkila, M.D., FACG  

## 2011-07-11 ENCOUNTER — Telehealth: Payer: Self-pay | Admitting: Internal Medicine

## 2011-07-11 NOTE — Telephone Encounter (Signed)
Spoke with pts son and he is aware. Pt to contact his PCP regarding the disc disease and his pain. Pt has F/U OV appt scheduled.

## 2011-07-11 NOTE — Telephone Encounter (Signed)
Pts son is calling requesting lab results and mri results. Please advise.

## 2011-07-11 NOTE — Telephone Encounter (Signed)
Labs look okay, no obvious cause for liver disease. Further discussion at his next visit with Arlyce Dice in April. He has disc disease on MRI. Perhaps this is responsible for right flank pain. He needs to see PCP for this.

## 2011-08-03 ENCOUNTER — Ambulatory Visit: Payer: Medicare Other | Admitting: Internal Medicine

## 2011-08-24 ENCOUNTER — Telehealth: Payer: Self-pay | Admitting: Hematology and Oncology

## 2011-08-24 NOTE — Telephone Encounter (Signed)
S/w the pt and he is aware of his new pt appt on 08/30/2011@1 :00pm

## 2011-08-25 ENCOUNTER — Telehealth: Payer: Self-pay | Admitting: Hematology and Oncology

## 2011-08-25 NOTE — Telephone Encounter (Signed)
Referred by Dr. Herb Grays Dx- Anemia

## 2011-08-29 ENCOUNTER — Ambulatory Visit (INDEPENDENT_AMBULATORY_CARE_PROVIDER_SITE_OTHER): Payer: Medicare Other | Admitting: Internal Medicine

## 2011-08-29 ENCOUNTER — Encounter: Payer: Self-pay | Admitting: Internal Medicine

## 2011-08-29 ENCOUNTER — Other Ambulatory Visit (INDEPENDENT_AMBULATORY_CARE_PROVIDER_SITE_OTHER): Payer: Medicare Other

## 2011-08-29 VITALS — BP 142/88 | HR 65 | Ht 68.0 in | Wt 195.6 lb

## 2011-08-29 DIAGNOSIS — K259 Gastric ulcer, unspecified as acute or chronic, without hemorrhage or perforation: Secondary | ICD-10-CM

## 2011-08-29 DIAGNOSIS — R109 Unspecified abdominal pain: Secondary | ICD-10-CM

## 2011-08-29 DIAGNOSIS — R195 Other fecal abnormalities: Secondary | ICD-10-CM

## 2011-08-29 DIAGNOSIS — D509 Iron deficiency anemia, unspecified: Secondary | ICD-10-CM

## 2011-08-29 LAB — CBC WITH DIFFERENTIAL/PLATELET
Basophils Absolute: 0 10*3/uL (ref 0.0–0.1)
Eosinophils Relative: 5.6 % — ABNORMAL HIGH (ref 0.0–5.0)
HCT: 30.9 % — ABNORMAL LOW (ref 39.0–52.0)
Lymphs Abs: 1.7 10*3/uL (ref 0.7–4.0)
MCV: 75.3 fl — ABNORMAL LOW (ref 78.0–100.0)
Monocytes Absolute: 0.4 10*3/uL (ref 0.1–1.0)
Neutrophils Relative %: 58.5 % (ref 43.0–77.0)
Platelets: 215 10*3/uL (ref 150.0–400.0)
RDW: 17.9 % — ABNORMAL HIGH (ref 11.5–14.6)
WBC: 6.1 10*3/uL (ref 4.5–10.5)

## 2011-08-29 NOTE — Patient Instructions (Signed)
Your physician has requested that you go to the basement for the following lab work before leaving today:  CBC  The following are our other primary care offices you can contact regarding establishing care:  [facility]:  [phone]  [facility]:  [phone]  [facility]:  [phone]

## 2011-08-29 NOTE — Progress Notes (Signed)
HISTORY OF PRESENT ILLNESS:  Kevin Rhodes is a 78 y.o. male with multiple significant medical problems as listed below. He presents today for followup regarding several issues. First, recent problems with right-sided/flank pain. Next, chronic anemia. The patient was seen last fall for GI bleeding while on Effient and aspirin (after coronary artery stent placement). He underwent colonoscopy and upper endoscopy 01/26/2011. Colonoscopy was unremarkable. Upper endoscopy revealed some shallow ulcers. He was placed on PPI. Followup upper endoscopy on 03/27/2011 revealed healing ulcers. He continued with anemia refractory to iron as well as recurrent worsening anemia without overt bleeding. Occasionally with intermittent Hemoccult positive stools. We did want to set him up for a capsule endoscopy, but he canceled without rescheduling despite multiple notifications. We're also interested in obtaining a hematology opinion regarding other contributors to anemia. He has that appointment tomorrow. The patient continues on daily PPI. He also continues on chronic iron therapy. He denies melena or hematochezia. Occasional abdominal fullness after meals with some discomfort. His right side and flank discomfort, for which he was seen in the office recently (see that dictation) has resolved. Imaging studies showed significant disease in his back. Question of hepatic cirrhosis raised. Multiple studies evaluating causes for liver disease were negative or normal. Liver tests including protein and albumin normal. He is accompanied by his son  REVIEW OF SYSTEMS:  All non-GI ROS negative except for  Past Medical History  Diagnosis Date  . Internal hemorrhoids without mention of complication   . Stricture and stenosis of esophagus   . Unspecified sinusitis (chronic)   . Allergic rhinitis   . Diverticulosis of colon (without mention of hemorrhage)   . Benign neoplasm of colon   . Calculus of bile duct without mention of  cholecystitis or obstruction   . Esophageal reflux   . Peptic ulcer, unspecified site, unspecified as acute or chronic, without mention of hemorrhage, perforation, or obstruction   . Other and unspecified hyperlipidemia   . Coronary atherosclerosis of unspecified type of vessel, native or graft   . Kidney stones   . Hypertension   . Myocardial infarction 09/1995  . Shortness of breath 03/06/11     "w/exertion"  . Pneumonia   . Diabetes mellitus     "pills"  . Arthritis   . Anemia   . Hemorrhoids     Past Surgical History  Procedure Date  . Coronary angioplasty with stent placement 01/20/2011    "2"  . Coronary angioplasty with stent placement 1997    '1"  . Colonoscopy 01/26/11    severe left diverticulosis, internal hemorrhoids  . Esophagogastroduodenoscopy 01/26/11    esophageal stricture, antral ulcers - H. pylori bx negative    Social History Kevin Rhodes  reports that he has quit smoking. His smoking use included Cigarettes. He has a 90 pack-year smoking history. He has never used smokeless tobacco. He reports that he does not drink alcohol or use illicit drugs.  family history includes Diabetes in his mother and Heart disease in his mother.  There is no history of Colon cancer.  Allergies  Allergen Reactions  . Ambien (Zolpidem Tartrate) Other (See Comments)    Keeps awake        PHYSICAL EXAMINATION:  Vital signs: BP 142/88  Pulse 65  Ht 5\' 8"  (1.727 m)  Wt 195 lb 9.6 oz (88.724 kg)  BMI 29.74 kg/m2  SpO2 98% General: Well-developed, well-nourished, no acute distress HEENT: Sclerae are anicteric, conjunctiva pink. Oral mucosa intact Lungs: Clear  Heart: Regular with systolic murmur Abdomen: soft, nontender, nondistended, no obvious ascites, no peritoneal signs, normal bowel sounds. No organomegaly. Extremities: No edema Psychiatric: alert and oriented x3. Cooperative    ASSESSMENT:  #1. Persistent microcytic anemia despite iron therapy. No overt GI  bleeding. Question other contributors to her anemia. I agree with formal hematology evaluation. He may need iron infusion therapy or other agent to stimulate red blood cell production. We will leave this to hematology. #2. History of anemia and Hemoccult-positive stool. Rule out small bowel lesion. Could have non-specific mucosal oozing on Effient #3. History of ulcer disease. Healed on followup endoscopy. Continued on PPI #4. Multiple significant medical problems #5. Recent problems with right-sided/flank pain. Negative urology workup (microscopic hematuria history of kidney stones). MRI with significant degenerative spinal disease. May be the cause of pain (referred) #6. Imaging changes suggesting hepatic cirrhosis with early portal hypertension. Negative workup for etiologies. Compensated. Normal LFTs. No varices or GAVE on endoscopy. Normal synthetic function. Observation in a 78 year old with multiple comorbidities at this point. Imaging, laboratories, and implications of diagnosis reviewed with patient and his son   PLAN:  #1. Continue iron #2. Continue PPI #3. Await hematology evaluation #4. Capsule endoscopy. Patient should have this performed at his nearest convenience #5. Repeat CBC today. Still anemic with hemoglobin 9.9

## 2011-08-30 ENCOUNTER — Ambulatory Visit: Payer: Medicare Other | Admitting: Hematology and Oncology

## 2011-08-30 ENCOUNTER — Ambulatory Visit: Payer: Medicare Other

## 2011-08-30 ENCOUNTER — Telehealth: Payer: Self-pay | Admitting: Hematology and Oncology

## 2011-08-30 NOTE — Telephone Encounter (Signed)
Pt's son scott called today to r/s 5/8 new pt appt to 5/16. Desk nurse aware.

## 2011-09-02 ENCOUNTER — Other Ambulatory Visit: Payer: Self-pay | Admitting: Internal Medicine

## 2011-09-07 ENCOUNTER — Ambulatory Visit (HOSPITAL_BASED_OUTPATIENT_CLINIC_OR_DEPARTMENT_OTHER): Payer: Medicare Other | Admitting: Hematology and Oncology

## 2011-09-07 ENCOUNTER — Ambulatory Visit (HOSPITAL_BASED_OUTPATIENT_CLINIC_OR_DEPARTMENT_OTHER): Payer: Medicare Other | Admitting: Lab

## 2011-09-07 ENCOUNTER — Telehealth: Payer: Self-pay | Admitting: Hematology and Oncology

## 2011-09-07 ENCOUNTER — Ambulatory Visit: Payer: Medicare Other

## 2011-09-07 ENCOUNTER — Encounter: Payer: Self-pay | Admitting: Hematology and Oncology

## 2011-09-07 VITALS — BP 141/77 | HR 70 | Temp 97.2°F | Ht 66.5 in | Wt 195.7 lb

## 2011-09-07 DIAGNOSIS — D539 Nutritional anemia, unspecified: Secondary | ICD-10-CM

## 2011-09-07 DIAGNOSIS — D649 Anemia, unspecified: Secondary | ICD-10-CM

## 2011-09-07 LAB — CBC WITH DIFFERENTIAL/PLATELET
Eosinophils Absolute: 0.3 10*3/uL (ref 0.0–0.5)
HCT: 28.8 % — ABNORMAL LOW (ref 38.4–49.9)
HGB: 9.1 g/dL — ABNORMAL LOW (ref 13.0–17.1)
LYMPH%: 21.3 % (ref 14.0–49.0)
MONO#: 0.5 10*3/uL (ref 0.1–0.9)
NEUT#: 3.7 10*3/uL (ref 1.5–6.5)
NEUT%: 65.1 % (ref 39.0–75.0)
Platelets: 162 10*3/uL (ref 140–400)
WBC: 5.7 10*3/uL (ref 4.0–10.3)
nRBC: 0 % (ref 0–0)

## 2011-09-07 LAB — URINALYSIS, MICROSCOPIC - CHCC
Blood: NEGATIVE
Nitrite: NEGATIVE
Protein: 30 mg/dL
Specific Gravity, Urine: 1.02 (ref 1.003–1.035)
pH: 5 (ref 4.6–8.0)

## 2011-09-07 LAB — MORPHOLOGY: PLT EST: ADEQUATE

## 2011-09-07 NOTE — Progress Notes (Signed)
CC:   Kevin Rhodes. Kevin Goodell, MD [name] R. Collins Scotland, M.D. Kevin Rhodes, M.D.  REFERRING PHYSICIAN:  Wilhemina Rhodes. Kevin Goodell, MD  IDENTIFYING STATEMENT:  The patient is a 78 year old man seen at request of Dr. Marina Rhodes with anemia.  HISTORY OF PRESENT ILLNESS:  The patient reports that anemia dates back to October last year following stent placement for coronary artery disease.  His hemoglobin had fallen to around 7.  He required a blood transfusion.  He has also noted intermittent hematuria.  He is known to have history of kidney stones and has had several lithotripsies throughout the year.  He also is known to have history of peptic ulcer disease diagnosed in the 58s.  He was referred to GI and Dr. Marina Rhodes performed both upper and lower endoscopy.  The colonoscopy on 01/26/2011 revealed diverticulosis and internal hemorrhoids, otherwise unremarkable.  An EGD on 02/05/2011 had shown clean-based antral ulcers. He is due to undergo a small capsule endoscopy.  The patient reports that he presently feels well.  He has no pain.  He denies blood in his stools.  He is not short of breath.  His weight is stable.  He eats a well-balanced diet.  His most recent lab work, CBC on 08/29/2011, notes a white cell count of 6.1, hemoglobin 9.7, hematocrit 30.9, platelets 215.  PAST MEDICAL HISTORY: 1. Cholelithiasis status post ERCP in 2009. 2. Nephrolithiasis status post lithotripsy. 3. Peptic ulcer disease diagnosed in 1950s. 4. Coronary artery disease with MI status post stent placement x3 in     1996 and 2012. 5. GERD. 6. History of esophageal stricture status post EGD. 7. Diabetes. 8. Hypertension. 9. Hyperlipidemia.  ALLERGIES:  None.  MEDICATIONS:  Norvasc 5 mg daily, aspirin 81 mg daily, hydrochlorothiazide 12.5 mg every other day, Avapro 300 mg q.h.s., Imdur 30 mg daily, metformin 500 mg 2 tablets daily, Lopressor 50 mg b.i.d., Lovaza 1 g 2 times daily, Protonix 40 mg twice daily, Niferex 150 mg 2 times  daily, Effient 10 mg daily, Zocor 40 mg q.h.s.  SOCIAL HISTORY:  The patient is married.  He is a former smoker, gave up 17 years ago, smoked 1-1/2 to 2 packs a day for 40 years.  He denies alcohol use.  He is a retired Corporate investment banker.  His wife is a patient here with breast cancer.  FAMILY HISTORY:  Patient's daughter has melanoma.  Negative for hematologic malignancies.  REVIEW OF SYSTEMS:  Denies fever, chills, night sweats, anorexia, weight loss.  GI:  Denies nausea, vomiting, abdominal pain, diarrhea, melena, hematochezia.  GU:  Denies dysuria, hematuria, nocturia, frequency. Cardiovascular:  Denies chest pain, PND, orthopnea, ankle swelling. Respiratory:  Denies cough, hemoptysis, wheeze, shortness of breath. Skin:  No bruising or bleeding.  Neurologic:  Denies headaches, vision change, extremity weakness.  Rest of review of systems negative.  PHYSICAL EXAM:  General:  The patient is a well-appearing, well- nourished, man in no distress.  Vitals:  Pulse 70, blood pressure 141/71, temperature 97.2, respirations 20, weight 195 pounds.  HEENT: Head is atraumatic, normocephalic.  Sclerae anicteric.  Pupils equal, round, reactive to light.  Mouth moist without ulcerations, thrush, or lesions.  Neck:  Supple without adenopathy.  Chest:  Clear to percussion and auscultation.  CVS:  First and second heart sounds present.  No added sounds or murmurs.  Abdomen:  Soft, nontender.  No hepatomegaly. No masses.  Bowel sounds present.  Extremities:  Reveal no edema. Pulses present and symmetrical.  Lymph nodes:  No palpable  adenopathy. CNS:  Nonfocal.  IMPRESSION AND PLAN:  Mr. Redd is a pleasant 78 year old gentleman with a history of anemia.  He has a history significant for antral ulcers and also a history for kidney stones.  He at present does not have overt GI bleeding.  He is on oral iron and his hemoglobin has actually improved by 2 points, presently 9.7.  He is symptomatic.   I would like to see where his CBC stands today, so he will return to lab to have a CBC with differential.  We will also review a smear.  We will also assess iron stores with iron TIBC and ferritin.  We will rule out hemolysis with Coombs and haptoglobin.  He has noted blood in his urine intermittently so we will obtain a urinalysis.  We will also rule out myelodyscrasia with serum protein electrophoresis and IFE.  If the patient's iron stores are low, he may benefit from IV iron in the form of Feraheme.  We discussed the logistics and side effects of therapy which were primarily infusional.  Thank you for the referral.    ______________________________ Laurice Record, M.D. LIO/MEDQ  D:  09/07/2011  T:  09/07/2011  Job:  [id number]

## 2011-09-07 NOTE — Progress Notes (Signed)
This office note has been dictated.

## 2011-09-07 NOTE — Telephone Encounter (Signed)
Gave pt appt calendar for May and June [address]1027 .

## 2011-09-07 NOTE — Patient Instructions (Signed)
Kevin Rhodes  [id number]  [facility] Discharge Instructions  RECOMMENDATIONS MADE BY THE CONSULTANT AND ANY TEST RESULTS WILL BE SENT TO YOUR REFERRING DOCTOR.   EXAM FINDINGS BY MD TODAY AND SIGNS AND SYMPTOMS TO REPORT TO CLINIC OR PRIMARY MD:   Your current list of medications are: Current Outpatient Prescriptions  Medication Sig Dispense Refill  . amLODipine (NORVASC) 5 MG tablet Take 5 mg by mouth daily after supper.        Marland Kitchen aspirin 81 MG chewable tablet Chew 81 mg by mouth at bedtime.        . cyclobenzaprine (FLEXERIL) 10 MG tablet Take 1/2 tablet at bedtime.  20 tablet  0  . hydrochlorothiazide (MICROZIDE) 12.5 MG capsule Take 12.5 mg by mouth every other day.        Marland Kitchen HYDROcodone-acetaminophen (NORCO) 5-325 MG per tablet 1-1/2 tab every 6 hours as needed for pain  30 tablet  0  . irbesartan (AVAPRO) 300 MG tablet Take 300 mg by mouth at bedtime.        . isosorbide mononitrate (IMDUR) 30 MG 24 hr tablet       . metFORMIN (GLUCOPHAGE) 500 MG tablet Take 500 mg by mouth 2 (two) times daily with a meal.        . metoprolol (LOPRESSOR) 50 MG tablet Take 50 mg by mouth 2 (two) times daily.        Marland Kitchen omega-3 acid ethyl esters (LOVAZA) 1 G capsule Take 2 g by mouth 2 (two) times daily.        . pantoprazole (PROTONIX) 40 MG tablet TAKE 1 TABLET BY MOUTH TWICE DAILY  60 tablet  3  . polysaccharide iron (NIFEREX) 150 MG CAPS capsule Take 150 mg by mouth 2 (two) times daily.        . prasugrel (EFFIENT) 10 MG TABS Take 10 mg by mouth daily.        . simvastatin (ZOCOR) 40 MG tablet Take 40 mg by mouth at bedtime.         Current Facility-Administered Medications  Medication Dose Route Frequency Provider Last Rate Last Dose  . 0.9 %  sodium chloride infusion  500 mL Intravenous Continuous Hilarie Fredrickson, MD         INSTRUCTIONS GIVEN AND DISCUSSED:   SPECIAL INSTRUCTIONS/FOLLOW-UP:  See above.  I acknowledge that I have been informed and understand all the  instructions given to me and received a copy. I do not have any more questions at this time, but understand that I may call the [facility] at [phone] during business hours should I have any further questions or need assistance in obtaining follow-up care.

## 2011-09-07 NOTE — Progress Notes (Signed)
Dr.     Herb Grays      -      Primary. Dr.     Erlene Quan      -      Cardiologist. Dr.     Marina Goodell       -     GI.  [facility]    On    [address]  [place]   [place]    Cell    Phone       [phone].

## 2011-09-11 LAB — COMPREHENSIVE METABOLIC PANEL
ALT: 18 U/L (ref 0–53)
BUN: 23 mg/dL (ref 6–23)
CO2: 23 mEq/L (ref 19–32)
Calcium: 9 mg/dL (ref 8.4–10.5)
Chloride: 106 mEq/L (ref 96–112)
Creatinine, Ser: 1.4 mg/dL — ABNORMAL HIGH (ref 0.50–1.35)

## 2011-09-11 LAB — LACTATE DEHYDROGENASE: LDH: 163 U/L (ref 94–250)

## 2011-09-11 LAB — PROTEIN ELECTROPHORESIS, SERUM
Albumin ELP: 57.7 % (ref 55.8–66.1)
Beta Globulin: 7.4 % — ABNORMAL HIGH (ref 4.7–7.2)
Total Protein, Serum Electrophoresis: 6.1 g/dL (ref 6.0–8.3)

## 2011-09-11 LAB — IRON AND TIBC: TIBC: 445 ug/dL — ABNORMAL HIGH (ref 215–435)

## 2011-09-11 LAB — DIRECT ANTIGLOBULIN TEST (NOT AT ARMC)
DAT (Complement): NEGATIVE
DAT IgG: NEGATIVE

## 2011-09-11 LAB — VITAMIN B12: Vitamin B-12: 223 pg/mL (ref 211–911)

## 2011-09-11 LAB — FOLATE: Folate: 10.6 ng/mL

## 2011-09-13 ENCOUNTER — Other Ambulatory Visit: Payer: Self-pay | Admitting: Hematology and Oncology

## 2011-09-13 ENCOUNTER — Ambulatory Visit (HOSPITAL_BASED_OUTPATIENT_CLINIC_OR_DEPARTMENT_OTHER): Payer: Medicare Other

## 2011-09-13 VITALS — BP 180/80 | HR 64 | Temp 98.1°F

## 2011-09-13 DIAGNOSIS — D539 Nutritional anemia, unspecified: Secondary | ICD-10-CM

## 2011-09-13 DIAGNOSIS — D649 Anemia, unspecified: Secondary | ICD-10-CM

## 2011-09-13 MED ORDER — SODIUM CHLORIDE 0.9 % IV SOLN
1020.0000 mg | Freq: Once | INTRAVENOUS | Status: AC
  Administered 2011-09-13: 1020 mg via INTRAVENOUS
  Filled 2011-09-13: qty 34

## 2011-09-13 MED ORDER — SODIUM CHLORIDE 0.9 % IV SOLN
Freq: Once | INTRAVENOUS | Status: AC
  Administered 2011-09-13: 09:00:00 via INTRAVENOUS

## 2011-09-13 NOTE — Progress Notes (Signed)
Patient monitored for 30 minutes following infusion. Patient denies sob, itching, or any other symptoms. Patients vitals stable.

## 2011-09-13 NOTE — Patient Instructions (Signed)
Feraheme What is this medicine?  Iron is used to make healthy red blood cells, which carry oxygen and nutrients through the body. This medicine is used to treat people who cannot take iron by mouth and have low levels of iron in the blood. This medicine may be used for other purposes; ask your health care provider or pharmacist if you have questions. What should I tell my health care provider before I take this medicine? They need to know if you have any of these conditions: -anemia not caused by low iron levels -heart disease -high levels of iron in the blood -kidney disease -liver disease -an unusual or allergic reaction to iron, other medicines, foods, dyes, or preservatives -pregnant or trying to get pregnant -breast-feeding How should I use this medicine? This medicine is for injection into a vein . It is given by a health care professional in a hospital or clinic setting. This list may not describe all possible interactions. Give your health care provider a list of all the medicines, herbs, non-prescription drugs, or dietary supplements you use. Also tell them if you smoke, drink  What side effects may I notice from receiving this medicine? Side effects that you should report to your doctor or health care professional as soon as possible: -allergic reactions like skin rash, itching or hives, swelling of the face, lips, or tongue -blue lips, nails, or skin -breathing problems -changes in blood pressure -chest pain -confusion -fast, irregular heartbeat -feeling faint or lightheaded, falls -fever or chills -flushing, sweating, or hot feelings -joint or muscle aches or pains -pain, tingling, numbness in the hands or feet -seizures -unusually weak or tired Side effects that usually do not require medical attention (report to your doctor or health care professional if they continue or are bothersome): -change in taste (metallic taste) -diarrhea -headache -irritation at site where  injected -nausea, vomiting -stomach upset This list may not describe all possible side effects. Call your doctor for medical advice about side effects. You may report side effects to FDA at 1-800-FDA-1088. Where should I keep my medicine? This drug is given in a hospital or clinic and will not be stored at home. NOTE: This sheet is a summary. It may not cover all possible information. If you have questions about this medicine, talk to your doctor, pharmacist, or health care provider.  2012, Elsevier/Gold Standard. (08/27/2007 4:59:50 PM)

## 2011-10-06 ENCOUNTER — Other Ambulatory Visit (HOSPITAL_BASED_OUTPATIENT_CLINIC_OR_DEPARTMENT_OTHER): Payer: Medicare Other | Admitting: Lab

## 2011-10-06 DIAGNOSIS — D539 Nutritional anemia, unspecified: Secondary | ICD-10-CM

## 2011-10-06 LAB — IRON AND TIBC
%SAT: 45 % (ref 20–55)
Iron: 157 ug/dL (ref 42–165)
UIBC: 189 ug/dL (ref 125–400)

## 2011-10-06 LAB — FOLATE: Folate: 6.9 ng/mL

## 2011-10-06 LAB — BASIC METABOLIC PANEL
Chloride: 109 mEq/L (ref 96–112)
Creatinine, Ser: 1.49 mg/dL — ABNORMAL HIGH (ref 0.50–1.35)
Potassium: 4 mEq/L (ref 3.5–5.3)

## 2011-10-13 ENCOUNTER — Encounter: Payer: Self-pay | Admitting: Hematology and Oncology

## 2011-10-13 ENCOUNTER — Ambulatory Visit (HOSPITAL_BASED_OUTPATIENT_CLINIC_OR_DEPARTMENT_OTHER): Payer: Medicare Other | Admitting: Hematology and Oncology

## 2011-10-13 ENCOUNTER — Telehealth: Payer: Self-pay | Admitting: Hematology and Oncology

## 2011-10-13 VITALS — BP 152/81 | HR 56 | Temp 97.0°F | Ht 66.5 in | Wt 193.0 lb

## 2011-10-13 DIAGNOSIS — D509 Iron deficiency anemia, unspecified: Secondary | ICD-10-CM

## 2011-10-13 DIAGNOSIS — D539 Nutritional anemia, unspecified: Secondary | ICD-10-CM

## 2011-10-13 DIAGNOSIS — K259 Gastric ulcer, unspecified as acute or chronic, without hemorrhage or perforation: Secondary | ICD-10-CM

## 2011-10-13 NOTE — Telephone Encounter (Signed)
Gave pt appt calendar  for December 2013 lab and MD

## 2011-10-13 NOTE — Progress Notes (Signed)
This office note has been dictated.

## 2011-10-13 NOTE — Patient Instructions (Signed)
Kevin Rhodes  [id number]  [facility] Discharge Instructions  RECOMMENDATIONS MADE BY THE CONSULTANT AND ANY TEST RESULTS WILL BE SENT TO YOUR REFERRING DOCTOR.   EXAM FINDINGS BY MD TODAY AND SIGNS AND SYMPTOMS TO REPORT TO CLINIC OR PRIMARY MD:   Your current list of medications are: Current Outpatient Prescriptions  Medication Sig Dispense Refill  . amLODipine (NORVASC) 5 MG tablet Take 5 mg by mouth daily after supper.        Marland Kitchen aspirin 81 MG chewable tablet Chew 81 mg by mouth at bedtime.        . hydrochlorothiazide (MICROZIDE) 12.5 MG capsule Take 12.5 mg by mouth every other day.        . irbesartan (AVAPRO) 300 MG tablet Take 300 mg by mouth at bedtime.        . isosorbide mononitrate (IMDUR) 30 MG 24 hr tablet 30 mg daily.       . metFORMIN (GLUCOPHAGE) 500 MG tablet Take 500 mg by mouth 2 (two) times daily with a meal.        . metoprolol (LOPRESSOR) 50 MG tablet Take 50 mg by mouth 2 (two) times daily.        Marland Kitchen omega-3 acid ethyl esters (LOVAZA) 1 G capsule Take 1 g by mouth 2 (two) times daily.       . pantoprazole (PROTONIX) 40 MG tablet TAKE 1 TABLET BY MOUTH TWICE DAILY  60 tablet  3  . polysaccharide iron (NIFEREX) 150 MG CAPS capsule Take 150 mg by mouth 2 (two) times daily.        . prasugrel (EFFIENT) 10 MG TABS Take 10 mg by mouth daily.        . simvastatin (ZOCOR) 40 MG tablet Take 40 mg by mouth at bedtime.         Current Facility-Administered Medications  Medication Dose Route Frequency Provider Last Rate Last Dose  . 0.9 %  sodium chloride infusion  500 mL Intravenous Continuous Hilarie Fredrickson, MD         INSTRUCTIONS GIVEN AND DISCUSSED:   SPECIAL INSTRUCTIONS/FOLLOW-UP:  See above.  I acknowledge that I have been informed and understand all the instructions given to me and received a copy. I do not have any more questions at this time, but understand that I may call the [facility] at [phone] during business hours  should I have any further questions or need assistance in obtaining follow-up care.

## 2011-10-13 NOTE — Progress Notes (Signed)
CC:   Kevin Rhodes, M.D. Kevin Rhodes. Kevin Goodell, MD Kevin Rhodes, M.D.  IDENTIFYING STATEMENT:  Patient is a 78 year old man who presents for followup.  INTERVAL HISTORY:  The patient was seen for anemia with a ferritin level of 11.  He had a history of antral ulcers.  He was offered IV iron in the form of Feraheme on 09/07/2011 which he tolerated well.  He notes very good energy levels.  Additional lab results are below.  ALLERGIES:  None.  MEDICATIONS:  Reviewed and updated.  PAST MEDICAL HISTORY/FAMILY HISTORY/SOCIAL HISTORY:  Unchanged.  PHYSICAL EXAM:  Patient is alert and oriented x3.  Vitals:  Pulse 56, blood pressure 150/81, temperature 97, respirations 20, weight 193 pounds.  HEENT:  Head is atraumatic, normocephalic.  Sclerae anicteric. Mouth moist.  Chest:  Clear.  Abdomen:  Soft, nontender.  Bowel sounds present.  Extremities:  No edema.  LAB DATA:  10/06/2011 ferritin 214 (11), iron 157, TIBC 346, saturation 45% (34%).  Sodium 140, potassium 4, chloride 109, CO2 20, BUN 22, creatinine 1.49, glucose 179.  M spike was not detected on the serum protein electrophoresis.  No evidence of hemolysis with a haptoglobin of 157.  Direct Coombs was negative.  LDH 163.  Urinalysis negative.  IMPRESSION AND PLAN:  Kevin Rhodes is a 78 year old man with history of iron-deficiency anemia felt to be secondary to antral ulcers.  Status post IV iron in the form of Feraheme.  He has had good increment in his iron stores.  He is asymptomatic.  He follows up in 6 months' time.    ______________________________ Kevin Rhodes, M.D. LIO/MEDQ  D:  10/13/2011  T:  10/13/2011  Job:  [id number]

## 2011-11-17 ENCOUNTER — Telehealth: Payer: Self-pay | Admitting: Hematology and Oncology

## 2011-11-17 NOTE — Telephone Encounter (Signed)
Pt's son Majel Homer requesting appt for his dad. Per son LO informed them if they felt they needed a sooner appt to give her a call. Per scott his dad is feeling a little weak so he wants to get an appt. Message was forwarded to desk nurse. Returned Scott's call and lmonvm informing him that we would get back to him w/appt d/t.

## 2011-11-20 ENCOUNTER — Telehealth: Payer: Self-pay | Admitting: *Deleted

## 2011-11-20 ENCOUNTER — Other Ambulatory Visit: Payer: Self-pay | Admitting: *Deleted

## 2011-11-20 DIAGNOSIS — D500 Iron deficiency anemia secondary to blood loss (chronic): Secondary | ICD-10-CM

## 2011-11-20 NOTE — Telephone Encounter (Signed)
Spoke with son scott and he thinks his dad may need blood. CBC/ hold clot for 11/21/11 @ 8:30 am.

## 2011-11-20 NOTE — Telephone Encounter (Signed)
S/w son scott re appt for 7/30 @ 8:30 am.

## 2011-11-21 ENCOUNTER — Other Ambulatory Visit (HOSPITAL_BASED_OUTPATIENT_CLINIC_OR_DEPARTMENT_OTHER): Payer: Medicare Other | Admitting: Lab

## 2011-11-21 DIAGNOSIS — D500 Iron deficiency anemia secondary to blood loss (chronic): Secondary | ICD-10-CM

## 2011-11-21 LAB — CBC WITH DIFFERENTIAL/PLATELET
BASO%: 0.3 % (ref 0.0–2.0)
Basophils Absolute: 0 10*3/uL (ref 0.0–0.1)
EOS%: 5 % (ref 0.0–7.0)
HCT: 38.7 % (ref 38.4–49.9)
HGB: 12.7 g/dL — ABNORMAL LOW (ref 13.0–17.1)
MCH: 28.7 pg (ref 27.2–33.4)
MCHC: 32.8 g/dL (ref 32.0–36.0)
MCV: 87.3 fL (ref 79.3–98.0)
MONO%: 7.2 % (ref 0.0–14.0)
NEUT%: 62.4 % (ref 39.0–75.0)

## 2012-03-29 ENCOUNTER — Other Ambulatory Visit: Payer: Medicare Other

## 2012-04-01 ENCOUNTER — Other Ambulatory Visit: Payer: Medicare Other

## 2012-04-03 ENCOUNTER — Ambulatory Visit: Payer: Medicare Other | Admitting: Hematology and Oncology

## 2012-04-22 ENCOUNTER — Telehealth: Payer: Self-pay | Admitting: *Deleted

## 2012-04-22 NOTE — Telephone Encounter (Signed)
Former pt of LO on reassignment list to be scheduled for next appt JG. Pt actually missed 12/11 appt w/LO so no f/u appt was ordered and no order for reschedule given. lmonvm for pt w/my name/direct number asking that he call us to r/s.

## 2012-05-06 ENCOUNTER — Other Ambulatory Visit: Payer: Self-pay | Admitting: Internal Medicine

## 2012-05-21 ENCOUNTER — Telehealth: Payer: Self-pay | Admitting: Oncology

## 2012-05-21 NOTE — Telephone Encounter (Signed)
Former pt of LO reassigned to [facility]. S/w pt's son scott re appt/new provider for 2/5 @ 8am lb/HH.

## 2012-05-29 ENCOUNTER — Other Ambulatory Visit (HOSPITAL_BASED_OUTPATIENT_CLINIC_OR_DEPARTMENT_OTHER): Payer: Medicare Other | Admitting: Lab

## 2012-05-29 ENCOUNTER — Telehealth: Payer: Self-pay | Admitting: Oncology

## 2012-05-29 ENCOUNTER — Ambulatory Visit (HOSPITAL_BASED_OUTPATIENT_CLINIC_OR_DEPARTMENT_OTHER): Payer: Medicare Other | Admitting: Oncology

## 2012-05-29 VITALS — BP 172/84 | HR 66 | Temp 96.9°F | Resp 20 | Ht 65.5 in | Wt 193.7 lb

## 2012-05-29 DIAGNOSIS — D539 Nutritional anemia, unspecified: Secondary | ICD-10-CM

## 2012-05-29 DIAGNOSIS — D500 Iron deficiency anemia secondary to blood loss (chronic): Secondary | ICD-10-CM

## 2012-05-29 DIAGNOSIS — K922 Gastrointestinal hemorrhage, unspecified: Secondary | ICD-10-CM

## 2012-05-29 DIAGNOSIS — D509 Iron deficiency anemia, unspecified: Secondary | ICD-10-CM

## 2012-05-29 DIAGNOSIS — N182 Chronic kidney disease, stage 2 (mild): Secondary | ICD-10-CM

## 2012-05-29 LAB — CBC WITH DIFFERENTIAL/PLATELET
BASO%: 0.3 % (ref 0.0–2.0)
EOS%: 6.2 % (ref 0.0–7.0)
LYMPH%: 25.2 % (ref 14.0–49.0)
MCH: 28.8 pg (ref 27.2–33.4)
MCHC: 33.3 g/dL (ref 32.0–36.0)
MCV: 86.4 fL (ref 79.3–98.0)
MONO%: 6.8 % (ref 0.0–14.0)
Platelets: 183 10*3/uL (ref 140–400)
RBC: 3.43 10*6/uL — ABNORMAL LOW (ref 4.20–5.82)

## 2012-05-29 LAB — IRON AND TIBC: %SAT: 7 % — ABNORMAL LOW (ref 20–55)

## 2012-05-29 LAB — FOLATE: Folate: 7.8 ng/mL

## 2012-05-29 MED ORDER — FERROUS SULFATE 325 (65 FE) MG PO TABS: 325.0000 mg | ORAL_TABLET | Freq: Two times a day (BID) | ORAL | Status: DC

## 2012-05-29 NOTE — Progress Notes (Signed)
[facility]  [phone] Fax:([phone]) [phone]   OFFICE PROGRESS NOTE   Cc:  Kevin Grays, MD  DIAGNOSIS: iron deficiency anemia from presumed chronic GI blood loss.   CURRENT THERAPY: oral and IV iron.   INTERVAL HISTORY: Kevin Rhodes 79 y.o. male returns for regular follow up with his son. He used to be under the care of Dr. Dalene Rhodes who recently left the practice.  I saw patient for the first time today to assume his care.  He forgot to take oral iron the last 2 months.  He noticed that for the past month, he has been having more fatigue, SOB and giving out with activities.  He denied any visible source of bleeding.  He has chronic arthritis and takes Alleve about 1-2x/day.    Patient denies fever, anorexia, weight loss, headache, visual changes, confusion, drenching night sweats, palpable lymph node swelling, mucositis, odynophagia, dysphagia, nausea vomiting, jaundice, chest pain, palpitation, dyspnea on exertion, productive cough, gum bleeding, epistaxis, hematemesis, hemoptysis, abdominal pain, abdominal swelling, early satiety, melena, hematochezia, hematuria, skin rash, spontaneous bleeding,heat or cold intolerance, bowel bladder incontinence, back pain, focal motor weakness, paresthesia, depression.    Past Medical History  Diagnosis Date  . Internal hemorrhoids without mention of complication   . Stricture and stenosis of esophagus   . Unspecified sinusitis (chronic)   . Allergic rhinitis   . Diverticulosis of colon (without mention of hemorrhage)   . Benign neoplasm of colon   . Calculus of bile duct without mention of cholecystitis or obstruction   . Esophageal reflux   . Peptic ulcer, unspecified site, unspecified as acute or chronic, without mention of hemorrhage, perforation, or obstruction   . Other and unspecified hyperlipidemia   . Coronary atherosclerosis of unspecified type of vessel, native or graft   . Kidney stones   . Hypertension    . Myocardial infarction 09/1995  . Shortness of breath 03/06/11     "w/exertion"  . Pneumonia   . Diabetes mellitus     "pills"  . Arthritis   . Anemia   . Hemorrhoids     Past Surgical History  Procedure Date  . Coronary angioplasty with stent placement 01/20/2011    "2"  . Coronary angioplasty with stent placement 1997    '1"  . Colonoscopy 01/26/11    severe left diverticulosis, internal hemorrhoids  . Esophagogastroduodenoscopy 01/26/11    esophageal stricture, antral ulcers - H. pylori bx negative    Current Outpatient Prescriptions  Medication Sig Dispense Refill  . amLODipine (NORVASC) 5 MG tablet Take 5 mg by mouth daily after supper.        Marland Kitchen aspirin 81 MG chewable tablet Chew 81 mg by mouth at bedtime.        . hydrochlorothiazide (MICROZIDE) 12.5 MG capsule Take 12.5 mg by mouth every other day.        . irbesartan (AVAPRO) 300 MG tablet Take 300 mg by mouth at bedtime.        . isosorbide mononitrate (IMDUR) 30 MG 24 hr tablet 30 mg daily.       . metFORMIN (GLUCOPHAGE) 500 MG tablet Take 500 mg by mouth 2 (two) times daily with a meal.        . metoprolol (LOPRESSOR) 50 MG tablet Take 50 mg by mouth 2 (two) times daily.        . naproxen sodium (ANAPROX) 220 MG tablet Take 220 mg by mouth 2 (two) times daily as  needed.      Marland Kitchen omega-3 acid ethyl esters (LOVAZA) 1 G capsule Take 1 g by mouth 2 (two) times daily.       . pantoprazole (PROTONIX) 40 MG tablet TAKE 1 TABLET BY MOUTH TWICE DAILY  60 tablet  3  . prasugrel (EFFIENT) 10 MG TABS Take 10 mg by mouth daily.        . simvastatin (ZOCOR) 40 MG tablet Take 40 mg by mouth at bedtime.        . ferrous sulfate 325 (65 FE) MG tablet Take 1 tablet (325 mg total) by mouth 2 (two) times daily.  120 tablet  4  . polysaccharide iron (NIFEREX) 150 MG CAPS capsule Take 150 mg by mouth 2 (two) times daily.          ALLERGIES:   has no known allergies.  REVIEW OF SYSTEMS:  The rest of the 14-point review of system was  negative.   Filed Vitals:   05/29/12 0835  BP: 172/84  Pulse: 66  Temp: 96.9 F (36.1 C)  Resp: 20   Wt Readings from Last 3 Encounters:  05/29/12 193 lb 11.2 oz (87.862 kg)  10/13/11 193 lb (87.544 kg)  09/07/11 195 lb 11.2 oz (88.769 kg)   ECOG Performance status: 1  PHYSICAL EXAMINATION:   General:  well-nourished man, in no acute distress.  Eyes:  no scleral icterus.  ENT:  There were no oropharyngeal lesions.  Neck was without thyromegaly.  Lymphatics:  Negative cervical, supraclavicular or axillary adenopathy.  Respiratory: lungs were clear bilaterally without wheezing or crackles.  Cardiovascular:  Regular rate and rhythm, S1/S2, without rub or gallop. There was 2/6 systolic murmur best heard in the left lower sternal border without radiation. There was 1+ bilateral pedal edema.  GI:  abdomen was soft, flat, nontender, nondistended, without organomegaly.  Muscoloskeletal:  no spinal tenderness of palpation of vertebral spine.  Skin exam was without echymosis, petichae.  Neuro exam was nonfocal.  Patient was able to get on and off exam table without assistance.  Gait was normal.  Patient was alerted and oriented.  Attention was good.   Language was appropriate.  Mood was normal without depression.  Speech was not pressured.  Thought content was not tangential.     LABORATORY/RADIOLOGY DATA:  Lab Results  Component Value Date   WBC 6.1 05/29/2012   HGB 9.9* 05/29/2012   HCT 29.6* 05/29/2012   PLT 183 05/29/2012   GLUCOSE 179* 10/06/2011   CHOL 127 03/07/2011   TRIG 199* 03/07/2011   HDL 28* 03/07/2011   LDLCALC 59 03/07/2011   ALKPHOS 56 09/07/2011   ALT 18 09/07/2011   AST 27 09/07/2011   NA 140 10/06/2011   K 4.0 10/06/2011   CL 109 10/06/2011   CREATININE 1.49* 10/06/2011   BUN 22 10/06/2011   CO2 20 10/06/2011   INR 1.14 03/06/2011   HGBA1C 7.6* 03/06/2011    ASSESSMENT AND PLAN:   1.  Chronic anemia of chronic GI blood loss:  Past EGD and colonoscopy showed antral ulcer.  He  declined capsule endoscopy last year when his Hgb improved with IV iron. - Resume oral iron Slow Fe 325mg  PO BID.  - Pending ferritin level.  If low, will recommend IV iron again.  If no improvement with iron, I will strongly recommend small bowl capsule endoscopy.   2.  Arthritis: given history of PUD, I strongly recommended him to avoid NSAIDS and just use Tylenol prn pain.  3.  Chronic kidney disease, stage II:  Most likely due to HTN. Cr today is pending.  4.  HTN:  On HCTZ, irbesartan, metoprolol.  5.  CAD:  On ASA, irbesartan, metoprolol, simvastatin.   6.  Follow up: monthly CBC until much improved Hgb.  RV in about 9 months.     The length of time of the face-to-face encounter was 25 minutes. More than 50% of time was spent counseling and coordination of care.

## 2012-05-29 NOTE — Patient Instructions (Addendum)
1.  Diagnosis:  Iron deficiency anemia from slow stomach bleed. 2.  Treatment:  IV iron when low blood iron and significant anemia; consider taking oral iron twice daily as tolerated (such as SlowFe or NuIron). 3.  Follow up:  Lab test monthly until normal Hgb.  Return visit in about 9 months.

## 2012-05-31 ENCOUNTER — Telehealth: Payer: Self-pay | Admitting: Oncology

## 2012-05-31 ENCOUNTER — Telehealth: Payer: Self-pay | Admitting: *Deleted

## 2012-05-31 ENCOUNTER — Other Ambulatory Visit: Payer: Self-pay | Admitting: Oncology

## 2012-05-31 DIAGNOSIS — D500 Iron deficiency anemia secondary to blood loss (chronic): Secondary | ICD-10-CM

## 2012-05-31 NOTE — Telephone Encounter (Signed)
Yes.  He will benefit from IV iron.  I've placed a POF with scheduler for within 1 week.  Thanks.

## 2012-05-31 NOTE — Telephone Encounter (Signed)
Call from pt's son asking about Ferritin results and if pt needs IV iron?

## 2012-05-31 NOTE — Telephone Encounter (Signed)
Per staff message and POF I have scheduled appts.  JMW  

## 2012-05-31 NOTE — Telephone Encounter (Signed)
Informed son of low iron and ferritin results and order for IV iron w/o next week.  Informed him to expect call from one of our schedulers. He verbalized understanding.

## 2012-05-31 NOTE — Telephone Encounter (Signed)
s.w. pt son and advised on 2.11.14 appt...Marland KitchenMarland Kitchenok

## 2012-06-04 ENCOUNTER — Ambulatory Visit (HOSPITAL_BASED_OUTPATIENT_CLINIC_OR_DEPARTMENT_OTHER): Payer: Medicare Other

## 2012-06-04 VITALS — BP 131/74 | HR 78 | Temp 97.3°F

## 2012-06-04 DIAGNOSIS — D500 Iron deficiency anemia secondary to blood loss (chronic): Secondary | ICD-10-CM

## 2012-06-04 DIAGNOSIS — K922 Gastrointestinal hemorrhage, unspecified: Secondary | ICD-10-CM

## 2012-06-04 MED ORDER — SODIUM CHLORIDE 0.9 % IV SOLN
1020.0000 mg | Freq: Once | INTRAVENOUS | Status: AC
  Administered 2012-06-04: 1020 mg via INTRAVENOUS
  Filled 2012-06-04: qty 34

## 2012-06-04 NOTE — Patient Instructions (Signed)
Ferumoxytol injection What is this medicine? FERUMOXYTOL is an iron complex. Iron is used to make healthy red blood cells, which carry oxygen and nutrients throughout the body. This medicine is used to treat iron deficiency anemia in people with chronic kidney disease. This medicine may be used for other purposes; ask your health care provider or pharmacist if you have questions. What should I tell my health care provider before I take this medicine? They need to know if you have any of these conditions: -anemia not caused by low iron levels -high levels of iron in the blood -magnetic resonance imaging (MRI) test scheduled -an unusual or allergic reaction to iron, other medicines, foods, dyes, or preservatives -pregnant or trying to get pregnant -breast-feeding How should I use this medicine? This medicine is for infusion into a vein. It is given by a health care professional in a hospital or clinic setting. Talk to your pediatrician regarding the use of this medicine in children. Special care may be needed. Overdosage: If you think you've taken too much of this medicine contact a poison control center or emergency room at once. Overdosage: If you think you have taken too much of this medicine contact a poison control center or emergency room at once. NOTE: This medicine is only for you. Do not share this medicine with others. What if I miss a dose? It is important not to miss your dose. Call your doctor or health care professional if you are unable to keep an appointment. What may interact with this medicine? This medicine may interact with the following medications: -other iron products This list may not describe all possible interactions. Give your health care provider a list of all the medicines, herbs, non-prescription drugs, or dietary supplements you use. Also tell them if you smoke, drink alcohol, or use illegal drugs. Some items may interact with your medicine. What should I watch  for while using this medicine? Visit your doctor or healthcare professional regularly. Tell your doctor or healthcare professional if your symptoms do not start to get better or if they get worse. You may need blood work done while you are taking this medicine. You may need to follow a special diet. Talk to your doctor. Foods that contain iron include: whole grains/cereals, dried fruits, beans, or peas, leafy green vegetables, and organ meats (liver, kidney). What side effects may I notice from receiving this medicine? Side effects that you should report to your doctor or health care professional as soon as possible: -allergic reactions like skin rash, itching or hives, swelling of the face, lips, or tongue -breathing problems -changes in blood pressure -feeling faint or lightheaded, falls -fever or chills -flushing, sweating, or hot feelings -swelling of the ankles or feet Side effects that usually do not require medical attention (Report these to your doctor or health care professional if they continue or are bothersome.): -diarrhea -headache -nausea, vomiting -stomach pain This list may not describe all possible side effects. Call your doctor for medical advice about side effects. You may report side effects to FDA at 1-800-FDA-1088. Where should I keep my medicine? This drug is given in a hospital or clinic and will not be stored at home. NOTE: This sheet is a summary. It may not cover all possible information. If you have questions about this medicine, talk to your doctor, pharmacist, or health care provider.  2013, [employer]. (01/01/2008 9:48:25 PM)  

## 2012-06-14 ENCOUNTER — Other Ambulatory Visit (INDEPENDENT_AMBULATORY_CARE_PROVIDER_SITE_OTHER): Payer: Medicare Other

## 2012-06-14 ENCOUNTER — Ambulatory Visit (INDEPENDENT_AMBULATORY_CARE_PROVIDER_SITE_OTHER): Payer: Medicare Other | Admitting: Internal Medicine

## 2012-06-14 ENCOUNTER — Encounter: Payer: Self-pay | Admitting: Internal Medicine

## 2012-06-14 VITALS — BP 146/78 | HR 60 | Ht 66.5 in | Wt 194.8 lb

## 2012-06-14 DIAGNOSIS — K219 Gastro-esophageal reflux disease without esophagitis: Secondary | ICD-10-CM

## 2012-06-14 DIAGNOSIS — R1084 Generalized abdominal pain: Secondary | ICD-10-CM

## 2012-06-14 DIAGNOSIS — R197 Diarrhea, unspecified: Secondary | ICD-10-CM

## 2012-06-14 DIAGNOSIS — R198 Other specified symptoms and signs involving the digestive system and abdomen: Secondary | ICD-10-CM

## 2012-06-14 DIAGNOSIS — R932 Abnormal findings on diagnostic imaging of liver and biliary tract: Secondary | ICD-10-CM

## 2012-06-14 LAB — BASIC METABOLIC PANEL
CO2: 24 mEq/L (ref 19–32)
Calcium: 9.3 mg/dL (ref 8.4–10.5)
Creatinine, Ser: 1.5 mg/dL (ref 0.4–1.5)
GFR: 46.86 mL/min — ABNORMAL LOW (ref >60.00)

## 2012-06-14 MED ORDER — MOVIPREP 100 G PO SOLR: 1.0000 | Freq: Once | ORAL | Status: DC

## 2012-06-14 NOTE — Progress Notes (Signed)
HISTORY OF PRESENT ILLNESS:  Kevin Rhodes is a 79 y.o. male with MULTIPLE significant medical problems as listed below. He presents today regarding abdominal pain and change in bowel habits. He is accompanied by his son. He was last evaluated 08/29/2011. See that dictation. Problems with right-sided and right flank pain at that time, now resolved. CT scan in March 2013 ([facility], just seen today) revealed cirrhosis of the liver with portal hypertension, pneumobilia with small contracted gallbladder, atherosclerosis, and bilateral inguinal hernias greater on the left. He has been evaluated previously for chronic iron deficiency anemia and intermittent Hemoccult positive stools in the face of chronic Effient and aspirin therapy. Colonoscopy and upper endoscopy were performed October 2012. Colonoscopy was unremarkable. Upper endoscopy revealed shallow ulcers for which she was placed on PPI. Followup endoscopy December 2012 revealed ulcer healing. He continued with anemia refractory to iron without obvious GI bleeding. On multiple occasions capsule endoscopy has been recommended. He has declined stating concerns of capsule retention. His current complaints are that of 4-5 months of intermittent postprandial urgency with loose stools and occasional incontinence. He has had 15 pound weight loss which she attributes to the death of his wife and altered eating habits. He denies food avoidance due to pain. Over the past month he describes a gnawing type mid abdominal discomfort. He thinks this may be similar to his ulcer symptoms. The discomfort is noticeable morning upon awakening. May or may not be exacerbated by meals. No associated nausea or vomiting. No dysphagia.Marland Kitchen He takes PPI most of the time. He uses NSAIDs intermittently for various aches and pains. He continues to see hematology and receives IV iron intermittently. Hemoglobin earlier this month was 9.9. Ferritin 10  REVIEW OF SYSTEMS:  All non-GI ROS  negative except for muscle pains, cramps, arthritis  Past Medical History  Diagnosis Date  . Internal hemorrhoids without mention of complication   . Stricture and stenosis of esophagus   . Unspecified sinusitis (chronic)   . Allergic rhinitis   . Diverticulosis of colon (without mention of hemorrhage)   . Benign neoplasm of colon   . Calculus of bile duct without mention of cholecystitis or obstruction   . Esophageal reflux   . Peptic ulcer, unspecified site, unspecified as acute or chronic, without mention of hemorrhage, perforation, or obstruction   . Other and unspecified hyperlipidemia   . Coronary atherosclerosis of unspecified type of vessel, native or graft   . Kidney stones   . Hypertension   . Myocardial infarction 09/1995  . Shortness of breath 03/06/11     "w/exertion"  . Pneumonia   . Diabetes mellitus     "pills"  . Arthritis   . Anemia   . Hemorrhoids     Past Surgical History  Procedure Laterality Date  . Coronary angioplasty with stent placement  01/20/2011    "2"  . Coronary angioplasty with stent placement  1997    '1"  . Colonoscopy  01/26/11    severe left diverticulosis, internal hemorrhoids  . Esophagogastroduodenoscopy  01/26/11    esophageal stricture, antral ulcers - H. pylori bx negative    Social History Kevin Rhodes  reports that he has quit smoking. His smoking use included Cigarettes. He has a 90 pack-year smoking history. He has never used smokeless tobacco. He reports that he does not drink alcohol or use illicit drugs.  family history includes Diabetes in his mother and Heart disease in his mother.  There is no history of  Colon cancer.  No Known Allergies     PHYSICAL EXAMINATION: Vital signs: BP 146/78  Pulse 60  Ht 5' 6.5" (1.689 m)  Wt 194 lb 12.8 oz (88.361 kg)  BMI 30.97 kg/m2  Constitutional: Elderly, generally well-appearing, no acute distress Psychiatric: alert and oriented x3, cooperative Eyes: extraocular movements  intact, anicteric, conjunctiva pink Mouth: oral pharynx moist, no lesions Neck: supple no lymphadenopathy Cardiovascular: heart regular rate and rhythm, no murmur Lungs: clear to auscultation bilaterally Abdomen: soft, nontender, nondistended, no obvious ascites, no peritoneal signs, normal bowel sounds, no organomegaly Rectal: Omitted Extremities: no lower extremity edema bilaterally Skin: no lesions on visible extremities Neuro: No focal deficits.   ASSESSMENT:  #1. 4 week history of mid abdominal discomfort uncertain etiology. Rule out recurrent ulcer disease. He takes NSAIDs. On PPI. #2. Iron deficiency anemia. Ongoing. On chronic aspirin Effient #3. Cirrhosis of the liver on CT imaging last year #4. Pneumobilia with intact gallbladder on imaging last year. #5. Intermittent postprandial urgency with loose stools as described. Occasional incontinence. #6. Colonoscopy 2012 without significant pathology #7. Multiple medical problems    PLAN:  #1. Diagnostic upper endoscopy. The patient is high-risk given his multiple comorbidities and medications.The nature of the procedure, as well as the risks, benefits, and alternatives were carefully and thoroughly reviewed with the patient. Ample time for discussion and questions allowed. The patient understood, was satisfied, and agreed to proceed. #2. Continue Effient and aspirin for the exam #3. Adjust diabetic medications #4. Contrast-enhanced CT scan of the abdomen and pelvis #5. If all the above unrevealing, again encouraged undergo capsule endoscopy to rule out small bowel lesion that may be contributing to recurrent iron deficiency anemia and/or problems with pain

## 2012-06-14 NOTE — Patient Instructions (Addendum)
You have been scheduled for an endoscopy with propofol. Please follow written instructions given to you at your visit today. If you use inhalers (even only as needed) or a CPAP machine, please bring them with you on the day of your procedure.  Your physician has requested that you go to the basement for the following lab work before leaving today:  BMET   You may continue taking your Effient per [name].  You have been scheduled for a CT scan of the abdomen and pelvis at [facility] ([address]---this is in the same building as [facility]).   You are scheduled on 06-17-12 at 1:30pm. You should arrive 15 minutes prior to your appointment time for registration. Please follow the written instructions below on the day of your exam:  WARNING: IF YOU ARE ALLERGIC TO IODINE/X-RAY DYE, PLEASE NOTIFY RADIOLOGY IMMEDIATELY AT [phone]! YOU WILL BE GIVEN A 13 HOUR PREMEDICATION PREP.  1) Do not eat or drink anything after 8:30am (4 hours prior to your test) 2) You have been given 2 bottles of oral contrast to drink. The solution may taste better if refrigerated, but do NOT add ice or any other liquid to this solution. Shake well before drinking.    Drink 1 bottle of contrast @11 :30am (2 hours prior to your exam)  Drink 1 bottle of contrast @ 12:30am (1 hour prior to your exam)  You may take any medications as prescribed with a small amount of water except for the following: Metformin, Glucophage, Glucovance, Avandamet, Riomet, Fortamet, Actoplus Met, Janumet, Glumetza or Metaglip. The above medications must be held the day of the exam AND 48 hours after the exam.  The purpose of you drinking the oral contrast is to aid in the visualization of your intestinal tract. The contrast solution may cause some diarrhea. Before your exam is started, you will be given a small amount of fluid to drink. Depending on your individual set of symptoms, you may also receive an intravenous  injection of x-ray contrast/dye. Plan on being at [facility] for 30 minutes or long, depending on the type of exam you are having performed.  If you have any questions regarding your exam or if you need to reschedule, you may call the CT department at [phone] between the hours of 8:00 am and 5:00 pm, Monday-Friday.     ________________________________________________________________________

## 2012-06-17 ENCOUNTER — Encounter: Payer: Self-pay | Admitting: Internal Medicine

## 2012-06-17 ENCOUNTER — Other Ambulatory Visit: Payer: Medicare Other

## 2012-06-25 ENCOUNTER — Telehealth: Payer: Self-pay | Admitting: Internal Medicine

## 2012-06-25 NOTE — Telephone Encounter (Signed)
No

## 2012-06-26 ENCOUNTER — Encounter: Payer: Medicare Other | Admitting: Internal Medicine

## 2012-06-27 ENCOUNTER — Telehealth: Payer: Self-pay | Admitting: Internal Medicine

## 2012-06-27 NOTE — Telephone Encounter (Signed)
No

## 2012-07-01 ENCOUNTER — Encounter: Payer: Medicare Other | Admitting: Internal Medicine

## 2012-07-11 ENCOUNTER — Ambulatory Visit (HOSPITAL_COMMUNITY)
Admission: RE | Admit: 2012-07-11 | Discharge: 2012-07-11 | Disposition: A | Discharge status: Home / Self Care | Payer: Medicare Other | Source: Ambulatory Visit | Attending: Cardiovascular Disease | Admitting: Cardiovascular Disease

## 2012-07-11 DIAGNOSIS — I2510 Atherosclerotic heart disease of native coronary artery without angina pectoris: Secondary | ICD-10-CM | POA: Insufficient documentation

## 2012-07-11 DIAGNOSIS — Z79899 Other long term (current) drug therapy: Secondary | ICD-10-CM | POA: Insufficient documentation

## 2012-07-11 DIAGNOSIS — D649 Anemia, unspecified: Secondary | ICD-10-CM | POA: Insufficient documentation

## 2012-07-11 LAB — PLATELET INHIBITION P2Y12: Platelet Function  P2Y12: 219 [PRU] (ref 194–418)

## 2012-08-26 ENCOUNTER — Encounter: Payer: Self-pay | Admitting: Oncology

## 2012-08-26 ENCOUNTER — Other Ambulatory Visit (HOSPITAL_BASED_OUTPATIENT_CLINIC_OR_DEPARTMENT_OTHER): Payer: Medicare Other

## 2012-08-26 DIAGNOSIS — K922 Gastrointestinal hemorrhage, unspecified: Secondary | ICD-10-CM

## 2012-08-26 DIAGNOSIS — D509 Iron deficiency anemia, unspecified: Secondary | ICD-10-CM

## 2012-08-26 DIAGNOSIS — D500 Iron deficiency anemia secondary to blood loss (chronic): Secondary | ICD-10-CM

## 2012-08-26 LAB — CBC WITH DIFFERENTIAL/PLATELET
BASO%: 0.2 % (ref 0.0–2.0)
EOS%: 5.7 % (ref 0.0–7.0)
HCT: 34.4 % — ABNORMAL LOW (ref 38.4–49.9)
LYMPH%: 23.7 % (ref 14.0–49.0)
MCH: 28.9 pg (ref 27.2–33.4)
MCHC: 33.3 g/dL (ref 32.0–36.0)
NEUT%: 62.6 % (ref 39.0–75.0)
lymph#: 1.4 10*3/uL (ref 0.9–3.3)

## 2012-08-26 LAB — FERRITIN: Ferritin: 39 ng/mL (ref 22–322)

## 2012-10-14 ENCOUNTER — Other Ambulatory Visit: Payer: Self-pay | Admitting: Internal Medicine

## 2012-10-31 ENCOUNTER — Other Ambulatory Visit: Payer: Self-pay | Admitting: Cardiovascular Disease

## 2012-11-01 NOTE — Telephone Encounter (Signed)
Rx was sent to pharmacy electronically. 

## 2012-11-11 ENCOUNTER — Other Ambulatory Visit: Payer: Self-pay | Admitting: Cardiovascular Disease

## 2012-11-11 NOTE — Telephone Encounter (Signed)
Metoprolol refilled x 6

## 2012-11-25 ENCOUNTER — Other Ambulatory Visit (HOSPITAL_BASED_OUTPATIENT_CLINIC_OR_DEPARTMENT_OTHER): Payer: Medicare Other

## 2012-11-25 DIAGNOSIS — K922 Gastrointestinal hemorrhage, unspecified: Secondary | ICD-10-CM

## 2012-11-25 DIAGNOSIS — D500 Iron deficiency anemia secondary to blood loss (chronic): Secondary | ICD-10-CM

## 2012-11-25 DIAGNOSIS — D509 Iron deficiency anemia, unspecified: Secondary | ICD-10-CM

## 2012-11-25 LAB — CBC WITH DIFFERENTIAL/PLATELET
Basophils Absolute: 0 10*3/uL (ref 0.0–0.1)
Eosinophils Absolute: 0.3 10*3/uL (ref 0.0–0.5)
HGB: 12.3 g/dL — ABNORMAL LOW (ref 13.0–17.1)
LYMPH%: 23.9 % (ref 14.0–49.0)
MCV: 87.2 fL (ref 79.3–98.0)
MONO%: 7 % (ref 0.0–14.0)
NEUT#: 3.7 10*3/uL (ref 1.5–6.5)
Platelets: 160 10*3/uL (ref 140–400)

## 2012-11-28 ENCOUNTER — Other Ambulatory Visit: Payer: Self-pay

## 2012-11-28 MED ORDER — IRBESARTAN 300 MG PO TABS: 300.0000 mg | ORAL_TABLET | Freq: Every day | ORAL | Status: DC

## 2012-11-28 NOTE — Telephone Encounter (Signed)
Rx was sent to pharmacy electronically. 

## 2012-12-18 ENCOUNTER — Encounter: Payer: Self-pay | Admitting: *Deleted

## 2012-12-18 ENCOUNTER — Telehealth: Payer: Self-pay | Admitting: Hematology and Oncology

## 2012-12-18 ENCOUNTER — Encounter: Payer: Self-pay | Admitting: Hematology and Oncology

## 2012-12-18 NOTE — Telephone Encounter (Signed)
Pt came by and inquire on lab result, former pt of [facility] then Dr. Gaylyn Rong, seeing [facility] on November, nurse talked to pt regarding result

## 2012-12-24 ENCOUNTER — Other Ambulatory Visit (HOSPITAL_COMMUNITY): Payer: Self-pay | Admitting: Family Medicine

## 2012-12-24 ENCOUNTER — Ambulatory Visit (HOSPITAL_COMMUNITY)
Admission: RE | Admit: 2012-12-24 | Discharge: 2012-12-24 | Disposition: A | Discharge status: Home / Self Care | Payer: Medicare Other | Source: Ambulatory Visit | Attending: Family Medicine | Admitting: Family Medicine

## 2012-12-24 DIAGNOSIS — R221 Localized swelling, mass and lump, neck: Secondary | ICD-10-CM

## 2012-12-24 DIAGNOSIS — I2510 Atherosclerotic heart disease of native coronary artery without angina pectoris: Secondary | ICD-10-CM | POA: Insufficient documentation

## 2012-12-24 DIAGNOSIS — E785 Hyperlipidemia, unspecified: Secondary | ICD-10-CM | POA: Insufficient documentation

## 2012-12-24 DIAGNOSIS — Z87891 Personal history of nicotine dependence: Secondary | ICD-10-CM | POA: Insufficient documentation

## 2012-12-24 DIAGNOSIS — I6529 Occlusion and stenosis of unspecified carotid artery: Secondary | ICD-10-CM | POA: Insufficient documentation

## 2012-12-24 DIAGNOSIS — R220 Localized swelling, mass and lump, head: Secondary | ICD-10-CM

## 2012-12-24 DIAGNOSIS — I658 Occlusion and stenosis of other precerebral arteries: Secondary | ICD-10-CM | POA: Insufficient documentation

## 2012-12-24 NOTE — Progress Notes (Addendum)
VASCULAR LAB PRELIMINARY  PRELIMINARY  PRELIMINARY  PRELIMINARY  Carotid duplex completed.    Preliminary report:  Bilateral:  ICA stenosis of 1-39%.  Vertebral artery flow antegrade.  Right: Heterogenous lesion measuring 1.4cm that is ovoid and avascular in the medial supraclavicular position; etiology unknown.   Kizzy Olafson, RVT 12/24/2012, 3:04 PM

## 2013-02-03 ENCOUNTER — Other Ambulatory Visit: Payer: Self-pay | Admitting: Cardiovascular Disease

## 2013-02-03 NOTE — Telephone Encounter (Signed)
Rx was sent to pharmacy electronically. 

## 2013-02-06 ENCOUNTER — Other Ambulatory Visit: Payer: Self-pay | Admitting: Oncology

## 2013-02-07 ENCOUNTER — Telehealth: Payer: Self-pay | Admitting: Hematology and Oncology

## 2013-02-07 NOTE — Telephone Encounter (Signed)
lvm for pt regarding to time change of appt on 11.3.14...mialed pt avs and letter

## 2013-02-12 ENCOUNTER — Other Ambulatory Visit: Payer: Self-pay | Admitting: Cardiovascular Disease

## 2013-02-24 ENCOUNTER — Telehealth: Payer: Self-pay | Admitting: Hematology and Oncology

## 2013-02-24 ENCOUNTER — Ambulatory Visit (HOSPITAL_BASED_OUTPATIENT_CLINIC_OR_DEPARTMENT_OTHER): Payer: Medicare Other | Admitting: Hematology and Oncology

## 2013-02-24 ENCOUNTER — Other Ambulatory Visit: Payer: Medicare Other | Admitting: Lab

## 2013-02-24 ENCOUNTER — Encounter: Payer: Self-pay | Admitting: Hematology and Oncology

## 2013-02-24 ENCOUNTER — Ambulatory Visit: Payer: Medicare Other | Admitting: Oncology

## 2013-02-24 VITALS — BP 172/73 | HR 66 | Temp 97.0°F | Resp 19 | Ht 66.5 in | Wt 192.7 lb

## 2013-02-24 DIAGNOSIS — D500 Iron deficiency anemia secondary to blood loss (chronic): Secondary | ICD-10-CM

## 2013-02-24 DIAGNOSIS — I10 Essential (primary) hypertension: Secondary | ICD-10-CM

## 2013-02-24 DIAGNOSIS — R220 Localized swelling, mass and lump, head: Secondary | ICD-10-CM

## 2013-02-24 DIAGNOSIS — D539 Nutritional anemia, unspecified: Secondary | ICD-10-CM

## 2013-02-24 DIAGNOSIS — I2510 Atherosclerotic heart disease of native coronary artery without angina pectoris: Secondary | ICD-10-CM

## 2013-02-24 NOTE — Telephone Encounter (Signed)
gv and printed appt sched and avs for pt for Feb  °

## 2013-02-24 NOTE — Progress Notes (Signed)
[name] OFFICE PROGRESS NOTE  Kevin Rhodes, [name], MD DIAGNOSIS:  Iron deficiency anemia, presumed chronic GI blood loss  SUMMARY OF HEMATOLOGIC HISTORY: This is a pleasant 79 year old gentleman accompanied by his son. The patient had been noticed to have iron deficiency anemia for some time. He is taking well antiplatelet agents and nonsteroidal anti-inflammatory medication. The reason for the antiplatelet agents because of cardiac stents. He was placed on oral iron supplements for iron deficiency anemia and had been forgetful. His last upper endoscopy revealed mild erosion in his stomach. INTERVAL HISTORY: Kevin Rhodes 79 y.o. male returns for further followup. He store in [place] is now taking care of his medications and he is getting to iron supplement a day consistently. The patient denies any recent signs or symptoms of bleeding such as spontaneous epistaxis, hematuria or hematochezia. He denies any recent fever, chills, night sweats or abnormal weight loss He noted a lump in the right of the neck for some time. His doctor is aware of this. In September 2014, he had a carotid ultrasound done and he was noted at the time he have a lump, which will order an additional investigation. The lump is not causing any pain no dysphagia.  I have reviewed the past medical history, past surgical history, social history and family history with the patient and they are unchanged from previous note.  ALLERGIES:  has No Known Allergies.  MEDICATIONS:  Current Outpatient Prescriptions  Medication Sig Dispense Refill  . amLODipine (NORVASC) 5 MG tablet Take 5 mg by mouth daily after supper.        Marland Kitchen aspirin 81 MG chewable tablet Chew 81 mg by mouth at bedtime.        . ferrous sulfate 325 (65 FE) MG tablet Take 1 tablet (325 mg total) by mouth 2 (two) times daily.  120 tablet  4  . hydrochlorothiazide (MICROZIDE) 12.5 MG capsule Take 12.5 mg by mouth every other day.        . irbesartan  (AVAPRO) 300 MG tablet Take 1 tablet (300 mg total) by mouth at bedtime.  30 tablet  4  . isosorbide mononitrate (IMDUR) 30 MG 24 hr tablet TAKE 1/2 TABLET BY MOUTH EVERY DAY  15 tablet  2  . metFORMIN (GLUCOPHAGE) 500 MG tablet Take 500 mg by mouth 2 (two) times daily with a meal.        . metoprolol (LOPRESSOR) 50 MG tablet TAKE 1 TABLET BY MOUTH TWICE A DAY  60 tablet  6  . omega-3 acid ethyl esters (LOVAZA) 1 G capsule Take 1 g by mouth 2 (two) times daily.       . pantoprazole (PROTONIX) 40 MG tablet TAKE 1 TABLET BY MOUTH TWICE DAILY  60 tablet  3  . polysaccharide iron (NIFEREX) 150 MG CAPS capsule Take 150 mg by mouth 2 (two) times daily.        . prasugrel (EFFIENT) 10 MG TABS Take 10 mg by mouth daily.        . simvastatin (ZOCOR) 40 MG tablet TAKE 1 TABLET BY MOUTH AT BEDTIME  30 tablet  7   No current facility-administered medications for this visit.     REVIEW OF SYSTEMS:   Constitutional: Denies fevers, chills or night sweats Eyes: Denies blurriness of vision Ears, nose, mouth, throat, and face: Denies mucositis or sore throat Respiratory: Denies cough, dyspnea or wheezes Cardiovascular: Denies palpitation, chest discomfort or lower extremity swelling Gastrointestinal:  Denies nausea, heartburn or change  in bowel habits Skin: Denies abnormal skin rashes Lymphatics: Denies new lymphadenopathy or easy bruising Neurological:Denies numbness, tingling or new weaknesses Behavioral/Psych: Mood is stable, no new changes  All other systems were reviewed with the patient and are negative.  PHYSICAL EXAMINATION: ECOG PERFORMANCE STATUS: 0 - Asymptomatic  Filed Vitals:   02/24/13 1157  BP: 172/73  Pulse: 66  Temp: 97 F (36.1 C)  Resp: 19   Filed Weights   02/24/13 1157  Weight: 192 lb 11.2 oz (87.408 kg)    GENERAL:alert, no distress and comfortable SKIN: skin color, texture, turgor are normal, no rashes or significant lesions EYES: normal, Conjunctiva are pink and  non-injected, sclera clear OROPHARYNX:no exudate, no erythema and lips, buccal mucosa, and tongue normal  NECK: supple, thyroid normal size, noted a bump in the right side of the neck separate from the thyroid region LYMPH:  no palpable lymphadenopathy in the cervical, axillary or inguinal LUNGS: clear to auscultation and percussion with normal breathing effort HEART: regular rate & rhythm and no murmurs and no lower extremity edema ABDOMEN:abdomen soft, non-tender and normal bowel sounds. Noted a very large hernia on the left side of his groin which has been present for a long time according to the patient Musculoskeletal:no cyanosis of digits and no clubbing  NEURO: alert & oriented x 3 with fluent speech, no focal motor/sensory deficits  LABORATORY DATA:  I have reviewed the data as listed   ASSESSMENT:  iron deficiency anemia, improving  PLAN:  #1 iron deficiency anemia The patient likely have chronic GI bleed due to dual antiplatelet agents. He also was found to have erosion in his stoma from EGD 2 years ago. He is doing well now with a consistent oral iron supplements. The patient is asymptomatic. I recommend we continue current treatment for another 3 months and repeat iron study and blood counts. #2 coronary artery disease History well with his antiplatelet agents. He would continue the same per direction from his cardiologist. #3 large inguinal hernia The patient declined further workup on this. His last CT scan show this is significant. #4 lump in the neck I reviewed with him and his son that this should be looked at with a CT scan of the neck. The patient wanted to defer and continue followup with his primary care provider for this. I would check his thyroid function tests next visit to make sure that this is not an enlarged goitre #5 hypertension The patient states that he's been compliant with his medications. This could be due to an white coat hypertension. He'll continue  his current blood pressure medication, to be monitor and dose adjusted by his primary care provider and his cardiologist. All questions were answered. The patient knows to call the clinic with any problems, questions or concerns. No barriers to learning was detected.  I spent 25 minutes counseling the patient face to face. The total time spent in the appointment was 40 minutes and more than 50% was on counseling.     [facility], Elektra Wartman, MD 02/24/2013 12:33 PM

## 2013-04-03 ENCOUNTER — Other Ambulatory Visit: Payer: Self-pay | Admitting: Internal Medicine

## 2013-04-07 ENCOUNTER — Ambulatory Visit (INDEPENDENT_AMBULATORY_CARE_PROVIDER_SITE_OTHER): Payer: Medicare Other | Admitting: Cardiovascular Disease

## 2013-04-07 ENCOUNTER — Encounter: Payer: Self-pay | Admitting: Cardiovascular Disease

## 2013-04-07 VITALS — BP 148/80 | HR 45 | Ht 68.0 in | Wt 194.0 lb

## 2013-04-07 DIAGNOSIS — I10 Essential (primary) hypertension: Secondary | ICD-10-CM | POA: Insufficient documentation

## 2013-04-07 DIAGNOSIS — I2510 Atherosclerotic heart disease of native coronary artery without angina pectoris: Secondary | ICD-10-CM

## 2013-04-07 DIAGNOSIS — E785 Hyperlipidemia, unspecified: Secondary | ICD-10-CM

## 2013-04-07 NOTE — Patient Instructions (Signed)
Your physician recommends that you schedule a follow-up appointment in: 6 months with extender  Your physician recommends that you schedule a follow-up appointment in: 12 months with Dr Allyson Sabal

## 2013-04-07 NOTE — Progress Notes (Signed)
04/07/2013 Kevin Rhodes   August 11, 1933  [id number]  Primary Physician Kevin Grays, MD Primary Cardiologist: Kevin Gess MD Kevin Rhodes   HPI:  The patient is a delightful 79 year old, thin-appearing, widowed (wife passed away 5 months ago) Caucasian male, father of 3, grandfather to 7 grandchildren who is accompanied by one of his sons today. I saw him 3 months ago. He has a history of CAD status post inferior wall myocardial infarction, PCI and stenting by myself on October 15, 1995. He had moderate LAD and circumflex disease as well as ramus disease at that time. Functional studies performed November 2011 showed inferior scar without ischemia, unchanged from prior studies. His other problems include hypertension, hyperlipidemia and remote tobacco abuse, having quit back in 1997. He was complaining of exertional dyspnea with pain going to his jaws and arms prompting heart cath reveal LAD and RCA disease which I stented in a staged fashion using 9 drug-eluting stents. His symptoms resolved. He became anemic and had a GI bleed and underwent colonoscopy by Dr. Marina Rhodes revealing diverticulosis, esophageal strictures and ulcers, as well as an antral ulcer. PPIs were doubled. I did begin him on some Lexapro when I saw him last for situational depression which he did not tolerate, though he apparently is getting better over time.  Since I saw him he ago has remained stable. He does complain of some dyspnea but denies chest pain.     Current Outpatient Prescriptions  Medication Sig Dispense Refill  . amLODipine (NORVASC) 5 MG tablet Take 5 mg by mouth daily after supper.        Marland Kitchen aspirin 81 MG chewable tablet Chew 81 mg by mouth at bedtime.        . ferrous sulfate 325 (65 FE) MG tablet Take 1 tablet (325 mg total) by mouth 2 (two) times daily.  120 tablet  4  . glimepiride (AMARYL) 2 MG tablet Take 2 mg by mouth daily.      . hydrochlorothiazide (MICROZIDE) 12.5 MG capsule Take 12.5  mg by mouth every other day.        . irbesartan (AVAPRO) 300 MG tablet Take 1 tablet (300 mg total) by mouth at bedtime.  30 tablet  4  . isosorbide mononitrate (IMDUR) 30 MG 24 hr tablet TAKE 1/2 TABLET BY MOUTH EVERY DAY  15 tablet  2  . metFORMIN (GLUCOPHAGE) 500 MG tablet Take 500 mg by mouth 2 (two) times daily with a meal.        . metoprolol (LOPRESSOR) 50 MG tablet TAKE 1 TABLET BY MOUTH TWICE A DAY  60 tablet  6  . omega-3 acid ethyl esters (LOVAZA) 1 G capsule Take 1 g by mouth 2 (two) times daily.       . pantoprazole (PROTONIX) 40 MG tablet TAKE 1 TABLET BY MOUTH TWICE DAILY  60 tablet  3  . polysaccharide iron (NIFEREX) 150 MG CAPS capsule Take 150 mg by mouth 2 (two) times daily.        . prasugrel (EFFIENT) 10 MG TABS Take 10 mg by mouth daily.        . simvastatin (ZOCOR) 40 MG tablet TAKE 1 TABLET BY MOUTH AT BEDTIME  30 tablet  7   No current facility-administered medications for this visit.    No Known Allergies  History   Social History  . Marital Status: Married    Spouse Name: N/A    Number of Children: 3  . Years  of Education: N/A   Occupational History  . retired    Social History Main Topics  . Smoking status: Former Smoker -- 2.00 packs/day for 45 years    Types: Cigarettes  . Smokeless tobacco: Never Used     Comment: "quit smoking cigarettes ~ 1997"  . Alcohol Use: No  . Drug Use: No  . Sexual Activity: Not on file   Other Topics Concern  . Not on file   Social History Narrative  . No narrative on file     Review of Systems: General: negative for chills, fever, night sweats or weight changes.  Cardiovascular: negative for chest pain, dyspnea on exertion, edema, orthopnea, palpitations, paroxysmal nocturnal dyspnea or shortness of breath Dermatological: negative for rash Respiratory: negative for cough or wheezing Urologic: negative for hematuria Abdominal: negative for nausea, vomiting, diarrhea, bright red blood per rectum, melena, or  hematemesis Neurologic: negative for visual changes, syncope, or dizziness All other systems reviewed and are otherwise negative except as noted above.    Blood pressure 148/80, pulse 45, height 5\' 8"  (1.727 m), weight 194 lb (87.998 kg).  General appearance: alert and no distress Neck: no adenopathy, no carotid bruit, no JVD, supple, symmetrical, trachea midline and thyroid not enlarged, symmetric, no tenderness/mass/nodules Lungs: clear to auscultation bilaterally Heart: regular rate and rhythm, S1, S2 normal, no murmur, click, rub or gallop Extremities: extremities normal, atraumatic, no cyanosis or edema  EKG sinus bradycardia of 45 without ST or T wave changes  ASSESSMENT AND PLAN:   Essential hypertension Controlled on current medications  HYPERLIPIDEMIA on statin therapy followed by his PCP  CORONARY ARTERY DISEASE Status post RCA stenting by myself 10/15/95. At that time he had an EF of 40% with inferior akinesia. I stented his mid RCA and proximal LAD 01/19/11. He does complain of dyspnea but denies chest pain.      Kevin Gess MD FACP,FACC,FAHA, [facility] 04/07/2013 12:29 PM

## 2013-04-07 NOTE — Assessment & Plan Note (Signed)
on statin therapy followed by his PCP

## 2013-04-07 NOTE — Assessment & Plan Note (Signed)
Status post RCA stenting by myself 10/15/95. At that time he had an EF of 40% with inferior akinesia. I stented his mid RCA and proximal LAD 01/19/11. He does complain of dyspnea but denies chest pain.

## 2013-04-07 NOTE — Assessment & Plan Note (Signed)
Controlled on current medications 

## 2013-04-11 ENCOUNTER — Other Ambulatory Visit: Payer: Self-pay | Admitting: Internal Medicine

## 2013-04-11 ENCOUNTER — Other Ambulatory Visit: Payer: Self-pay | Admitting: Cardiovascular Disease

## 2013-04-11 NOTE — Telephone Encounter (Signed)
Per office note, patient takes Effient.

## 2013-04-19 ENCOUNTER — Other Ambulatory Visit: Payer: Self-pay | Admitting: Cardiovascular Disease

## 2013-04-21 ENCOUNTER — Other Ambulatory Visit: Payer: Self-pay | Admitting: Cardiovascular Disease

## 2013-04-22 ENCOUNTER — Other Ambulatory Visit: Payer: Self-pay | Admitting: Cardiovascular Disease

## 2013-04-22 NOTE — Telephone Encounter (Signed)
Pt/daughter walked in office.  Stated pt has been trying to get a refill on Plavix and was told he is on Effient.  Stated pt was switched from Effient to Plavix not long afterward starting Effient b/c of the cost.  Paper chart requested and reviewed.  Pt was switched to Plavix from Effient on 2.19.14 r/t cost.  Plavix refill sent to pharmacy.  Effient dc'd from med list.  RN also printed med list and advised pt/daughter to review w/ meds at home and call back to update if any changes.  Pt/daughter verbalized understanding and agreed w/ plan.

## 2013-05-26 ENCOUNTER — Other Ambulatory Visit (HOSPITAL_BASED_OUTPATIENT_CLINIC_OR_DEPARTMENT_OTHER): Payer: Medicare Other

## 2013-05-26 ENCOUNTER — Telehealth: Payer: Self-pay | Admitting: *Deleted

## 2013-05-26 ENCOUNTER — Ambulatory Visit (HOSPITAL_BASED_OUTPATIENT_CLINIC_OR_DEPARTMENT_OTHER): Payer: Medicare Other | Admitting: Hematology and Oncology

## 2013-05-26 ENCOUNTER — Encounter: Payer: Self-pay | Admitting: Hematology and Oncology

## 2013-05-26 VITALS — BP 140/70 | HR 53 | Temp 97.5°F | Resp 18 | Ht 68.0 in | Wt 195.5 lb

## 2013-05-26 DIAGNOSIS — I2510 Atherosclerotic heart disease of native coronary artery without angina pectoris: Secondary | ICD-10-CM

## 2013-05-26 DIAGNOSIS — D509 Iron deficiency anemia, unspecified: Secondary | ICD-10-CM

## 2013-05-26 DIAGNOSIS — K208 Other esophagitis without bleeding: Secondary | ICD-10-CM

## 2013-05-26 DIAGNOSIS — D539 Nutritional anemia, unspecified: Secondary | ICD-10-CM

## 2013-05-26 DIAGNOSIS — D500 Iron deficiency anemia secondary to blood loss (chronic): Secondary | ICD-10-CM

## 2013-05-26 LAB — COMPREHENSIVE METABOLIC PANEL (CC13)
ALK PHOS: 73 U/L (ref 40–150)
ALT: 29 U/L (ref 0–55)
ANION GAP: 11 meq/L (ref 3–11)
AST: 29 U/L (ref 5–34)
Albumin: 3.6 g/dL (ref 3.5–5.0)
BILIRUBIN TOTAL: 1.69 mg/dL — AB (ref 0.20–1.20)
BUN: 19.8 mg/dL (ref 7.0–26.0)
CO2: 22 mEq/L (ref 22–29)
Calcium: 9.7 mg/dL (ref 8.4–10.4)
Chloride: 112 mEq/L — ABNORMAL HIGH (ref 98–109)
Creatinine: 1.4 mg/dL — ABNORMAL HIGH (ref 0.7–1.3)
GLUCOSE: 128 mg/dL (ref 70–140)
Potassium: 4.1 mEq/L (ref 3.5–5.1)
Sodium: 145 mEq/L (ref 136–145)
Total Protein: 6.9 g/dL (ref 6.4–8.3)

## 2013-05-26 LAB — IRON AND TIBC CHCC
%SAT: 40 % (ref 20–55)
IRON: 158 ug/dL (ref 42–163)
TIBC: 394 ug/dL (ref 202–409)
UIBC: 236 ug/dL (ref 117–376)

## 2013-05-26 LAB — CBC & DIFF AND RETIC
BASO%: 0.2 % (ref 0.0–2.0)
Basophils Absolute: 0 10*3/uL (ref 0.0–0.1)
EOS%: 15.6 % — ABNORMAL HIGH (ref 0.0–7.0)
Eosinophils Absolute: 1 10*3/uL — ABNORMAL HIGH (ref 0.0–0.5)
HCT: 37.7 % — ABNORMAL LOW (ref 38.4–49.9)
HGB: 12 g/dL — ABNORMAL LOW (ref 13.0–17.1)
Immature Retic Fract: 14.7 % — ABNORMAL HIGH (ref 3.00–10.60)
LYMPH%: 25.8 % (ref 14.0–49.0)
MCH: 28.5 pg (ref 27.2–33.4)
MCHC: 31.8 g/dL — AB (ref 32.0–36.0)
MCV: 89.5 fL (ref 79.3–98.0)
MONO#: 0.5 10*3/uL (ref 0.1–0.9)
MONO%: 8.3 % (ref 0.0–14.0)
NEUT#: 3.2 10*3/uL (ref 1.5–6.5)
NEUT%: 50.1 % (ref 39.0–75.0)
PLATELETS: 190 10*3/uL (ref 140–400)
RBC: 4.21 10*6/uL (ref 4.20–5.82)
RDW: 14.3 % (ref 11.0–14.6)
RETIC %: 1.77 % (ref 0.80–1.80)
Retic Ct Abs: 74.52 10*3/uL (ref 34.80–93.90)
WBC: 6.4 10*3/uL (ref 4.0–10.3)
lymph#: 1.7 10*3/uL (ref 0.9–3.3)

## 2013-05-26 LAB — T4, FREE: Free T4: 1.12 ng/dL (ref 0.80–1.80)

## 2013-05-26 LAB — FERRITIN CHCC: FERRITIN: 44 ng/mL (ref 22–316)

## 2013-05-26 LAB — TSH CHCC: TSH: 0.582 m(IU)/L (ref 0.320–4.118)

## 2013-05-26 NOTE — Telephone Encounter (Signed)
Message copied by Cathlean Cower on Mon May 26, 2013  3:30 PM ------      Message from: [facility], [place]: Mon May 26, 2013  3:13 PM      Regarding: ferritin level       Please call pt with results      No change in plan as discussed today ------

## 2013-05-26 NOTE — Progress Notes (Signed)
[place] OFFICE PROGRESS NOTE  SPEAR, [name], MD DIAGNOSIS:  Chronic iron deficiency anemia, presumed from chronic GI blood loss  SUMMARY OF HEMATOLOGIC HISTORY: This is a pleasant 80 year old gentleman accompanied by his son. The patient had been noticed to have iron deficiency anemia for some time. He is taking well antiplatelet agents and nonsteroidal anti-inflammatory medication. The reason for the antiplatelet agents because of cardiac stents. He was placed on oral iron supplements for iron deficiency anemia and had been forgetful. His last upper endoscopy revealed mild erosion in his stomach. INTERVAL HISTORY: Kevin Rhodes 80 y.o. male returns for further followup. He has been compliant taking iron supplement twice a day. Previously, he noted a lump in the right side of his neck. That has resolved. The patient denies any recent signs or symptoms of bleeding such as spontaneous epistaxis, hematuria or hematochezia. He denies any signs and symptoms of anemia such as chest pain, shortness of breath or dizziness I have reviewed the past medical history, past surgical history, social history and family history with the patient and they are unchanged from previous note.  ALLERGIES:  has No Known Allergies.  MEDICATIONS:  Current Outpatient Prescriptions  Medication Sig Dispense Refill  . acetaminophen (TYLENOL) 500 MG tablet Take 500 mg by mouth 2 (two) times daily.      Marland Kitchen amLODipine (NORVASC) 5 MG tablet Take 5 mg by mouth daily after supper.        Marland Kitchen aspirin 81 MG chewable tablet Chew 81 mg by mouth at bedtime.        . clopidogrel (PLAVIX) 75 MG tablet Take 1 tablet (75 mg total) by mouth daily.  30 tablet  11  . glimepiride (AMARYL) 2 MG tablet Take 4 mg by mouth daily.       . hydrochlorothiazide (MICROZIDE) 12.5 MG capsule Take 12.5 mg by mouth every other day.        . irbesartan (AVAPRO) 300 MG tablet Take 1 tablet (300 mg total) by mouth at bedtime.  30  tablet  4  . isosorbide mononitrate (IMDUR) 30 MG 24 hr tablet TAKE 1/2 TABLET BY MOUTH EVERY DAY  15 tablet  2  . metFORMIN (GLUCOPHAGE) 500 MG tablet Take 1,000 mg by mouth 2 (two) times daily with a meal.       . metoprolol (LOPRESSOR) 50 MG tablet TAKE 1 TABLET BY MOUTH TWICE A DAY  60 tablet  6  . omega-3 acid ethyl esters (LOVAZA) 1 G capsule Take 1 g by mouth 2 (two) times daily.       . pantoprazole (PROTONIX) 40 MG tablet TAKE 1 TABLET BY MOUTH TWICE DAILY  60 tablet  3  . pantoprazole (PROTONIX) 40 MG tablet TAKE 1 TABLET BY MOUTH TWICE DAILY  60 tablet  3  . polysaccharide iron (NIFEREX) 150 MG CAPS capsule Take 150 mg by mouth 2 (two) times daily.        . simvastatin (ZOCOR) 40 MG tablet TAKE 1 TABLET BY MOUTH AT BEDTIME  30 tablet  7   No current facility-administered medications for this visit.     REVIEW OF SYSTEMS:   Behavioral/Psych: Mood is stable, no new changes  All other systems were reviewed with the patient and are negative.  PHYSICAL EXAMINATION: ECOG PERFORMANCE STATUS: 0 - Asymptomatic  Filed Vitals:   05/26/13 1411  BP: 140/70  Pulse: 53  Temp: 97.5 F (36.4 C)  Resp: 18   Filed Weights   05/26/13  1411  Weight: 195 lb 8 oz (88.678 kg)    GENERAL:alert, no distress and comfortable SKIN: skin color, texture, turgor are normal, no rashes or significant lesions EYES: normal, Conjunctiva are pink and non-injected, sclera clear OROPHARYNX:no exudate, no erythema and lips, buccal mucosa, and tongue normal  NECK: supple, thyroid normal size, non-tender, without nodularity LYMPH:  no palpable lymphadenopathy in the cervical, axillary or inguinal Musculoskeletal:no cyanosis of digits and no clubbing  NEURO: alert & oriented x 3 with fluent speech, no focal motor/sensory deficits  LABORATORY DATA:  I have reviewed the data as listed Results for orders placed in visit on 05/26/13 (from the past 48 hour(s))  CBC & DIFF AND RETIC     Status: Abnormal    Collection Time    05/26/13  1:57 PM      Result Value Range   WBC 6.4  4.0 - 10.3 10e3/uL   NEUT# 3.2  1.5 - 6.5 10e3/uL   HGB 12.0 (*) 13.0 - 17.1 g/dL   HCT 37.7 (*) 38.4 - 49.9 %   Platelets 190  140 - 400 10e3/uL   MCV 89.5  79.3 - 98.0 fL   MCH 28.5  27.2 - 33.4 pg   MCHC 31.8 (*) 32.0 - 36.0 g/dL   RBC 4.21  4.20 - 5.82 10e6/uL   RDW 14.3  11.0 - 14.6 %   lymph# 1.7  0.9 - 3.3 10e3/uL   MONO# 0.5  0.1 - 0.9 10e3/uL   Eosinophils Absolute 1.0 (*) 0.0 - 0.5 10e3/uL   Basophils Absolute 0.0  0.0 - 0.1 10e3/uL   NEUT% 50.1  39.0 - 75.0 %   LYMPH% 25.8  14.0 - 49.0 %   MONO% 8.3  0.0 - 14.0 %   EOS% 15.6 (*) 0.0 - 7.0 %   BASO% 0.2  0.0 - 2.0 %   Retic % 1.77  0.80 - 1.80 %   Retic Ct Abs 74.52  34.80 - 93.90 10e3/uL   Immature Retic Fract 14.70 (*) 3.00 - 10.60 %    Lab Results  Component Value Date   WBC 6.4 05/26/2013   HGB 12.0* 05/26/2013   HCT 37.7* 05/26/2013   MCV 89.5 05/26/2013   PLT 190 05/26/2013   ASSESSMENT & PLAN:  #1 iron deficiency anemia The patient likely have chronic GI bleed due to dual antiplatelet agents. He also was found to have erosion in his stomach from EGD 2 years ago. He is doing well now with a consistent oral iron supplements. The patient is asymptomatic. I recommend he follow with his primary care provider with repeat ferritin in 3 months. I will call him once the result is available today. Our goal would be to keep his ferritin above 100 before discontinuation. The patient is aware he may have to be placed on iron supplement for the rest of his life due to chronic GI bleed #2 coronary artery disease He is doing well with his antiplatelet agents. He would continue the same per direction from his cardiologist. All questions were answered. The patient knows to call the clinic with any problems, questions or concerns. No barriers to learning was detected.  I spent 15 minutes counseling the patient face to face. The total time spent in the appointment  was 20 minutes and more than 50% was on counseling.     [facility], [place], MD 05/26/2013 2:36 PM

## 2013-05-26 NOTE — Telephone Encounter (Signed)
Informed pt of Ferritin results and continue plan as discussed w/ Dr. Alvy Bimler.  He verbalized understanding.

## 2013-06-13 ENCOUNTER — Other Ambulatory Visit: Payer: Self-pay | Admitting: Cardiovascular Disease

## 2013-06-27 ENCOUNTER — Other Ambulatory Visit: Payer: Self-pay | Admitting: Cardiovascular Disease

## 2013-06-27 NOTE — Telephone Encounter (Signed)
Rx was sent to pharmacy electronically. 

## 2013-07-17 ENCOUNTER — Other Ambulatory Visit: Payer: Self-pay

## 2013-07-17 MED ORDER — AMLODIPINE BESYLATE 5 MG PO TABS: 5.0000 mg | ORAL_TABLET | Freq: Every day | ORAL | Status: DC

## 2013-07-17 NOTE — Telephone Encounter (Signed)
Rx was sent to pharmacy electronically. 

## 2013-08-27 ENCOUNTER — Other Ambulatory Visit: Payer: Self-pay | Admitting: *Deleted

## 2013-08-27 MED ORDER — METOPROLOL TARTRATE 50 MG PO TABS: 50.0000 mg | ORAL_TABLET | Freq: Two times a day (BID) | ORAL | Status: DC

## 2013-08-28 ENCOUNTER — Telehealth: Payer: Self-pay | Admitting: Cardiovascular Disease

## 2013-08-29 NOTE — Telephone Encounter (Signed)
Closed Encounter  °

## 2013-09-23 ENCOUNTER — Ambulatory Visit (INDEPENDENT_AMBULATORY_CARE_PROVIDER_SITE_OTHER): Payer: Medicare Other | Admitting: Physician Assistant

## 2013-09-23 ENCOUNTER — Encounter: Payer: Self-pay | Admitting: Physician Assistant

## 2013-09-23 VITALS — BP 140/70 | HR 63 | Ht 68.0 in | Wt 194.0 lb

## 2013-09-23 DIAGNOSIS — I10 Essential (primary) hypertension: Secondary | ICD-10-CM

## 2013-09-23 DIAGNOSIS — I2510 Atherosclerotic heart disease of native coronary artery without angina pectoris: Secondary | ICD-10-CM

## 2013-09-23 DIAGNOSIS — E785 Hyperlipidemia, unspecified: Secondary | ICD-10-CM

## 2013-09-23 NOTE — Patient Instructions (Signed)
1.  Check statin medications and only take 1(simvastatin) 2.  Followup with Dr. Gwenlyn Found in 6 months or sooner if needed

## 2013-09-23 NOTE — Assessment & Plan Note (Signed)
As to the statin drugs listed on his medication list. We will check these when he gets home and make sure he is only taking one. I told him to take the simvastatin

## 2013-09-23 NOTE — Assessment & Plan Note (Signed)
Appear stable. He does have some exertional dyspnea when walking up an incline. He seemed to improve since winter. No signs of angina

## 2013-09-23 NOTE — Progress Notes (Signed)
Date:  09/23/2013   ID:  [name], DOB 1933-05-28, MRN [id number]  PCP:  Florina Ou, MD  Primary Cardiologist:  Gwenlyn Found     History of Present Illness: Kevin Rhodes is a 80 y.o. male thin-appearing, widowed (wife passed away 5 months ago) Caucasian male, father of 96, grandfather to 7 grandchildren who is accompanied by one of his sons today. He saw Dr. Gwenlyn Found 6 months ago.   He has a history of CAD status post inferior wall myocardial infarction, PCI and stenting by myself on October 15, 1995. He had moderate LAD and circumflex disease as well as ramus disease at that time. Functional studies performed November 2011 showed inferior scar without ischemia, unchanged from prior studies. His other problems include hypertension, hyperlipidemia and remote tobacco abuse, having quit back in 1997. He was complaining of exertional dyspnea with pain going to his jaws and arms prompting heart cath reveal LAD and RCA disease which I stented in a staged fashion using 9 drug-eluting stents. His symptoms resolved. He became anemic and had a GI bleed and underwent colonoscopy by Dr. Henrene Pastor revealing diverticulosis, esophageal strictures and ulcers, as well as an antral ulcer. PPIs were doubled.    Patient comes in today for 6 month evaluation. He reports some abdominal pain which occurs at  approximately 4 -5 AM.  He wonders is related to medications. However, he is not on anything new. He also complains of some exertional dyspnea which is actually improved since the wintertime.  He also gets some mild right lower extreme edema which improves by morning.  The patient currently denies nausea, vomiting, fever, chest pain, shortness of breath, orthopnea, dizziness, PND, cough, congestion, hematochezia, melena, claudication.  Wt Readings from Last 3 Encounters:  09/23/13 194 lb (87.998 kg)  05/26/13 195 lb 8 oz (88.678 kg)  04/07/13 194 lb (87.998 kg)     Past Medical History  Diagnosis Date  . Internal  hemorrhoids without mention of complication   . Stricture and stenosis of esophagus   . Unspecified sinusitis (chronic)   . Allergic rhinitis   . Diverticulosis of colon (without mention of hemorrhage)   . Benign neoplasm of colon   . Calculus of bile duct without mention of cholecystitis or obstruction   . Esophageal reflux   . Peptic ulcer, unspecified site, unspecified as acute or chronic, without mention of hemorrhage, perforation, or obstruction   . Other and unspecified hyperlipidemia   . Coronary atherosclerosis of unspecified type of vessel, native or graft   . Kidney stones   . Hypertension   . Myocardial infarction 09/1995  . Shortness of breath 03/06/11     "w/exertion"  . Pneumonia   . Diabetes mellitus     "pills"  . Arthritis   . Anemia   . Hemorrhoids     Current Outpatient Prescriptions  Medication Sig Dispense Refill  . acetaminophen (TYLENOL) 500 MG tablet Take 500 mg by mouth 2 (two) times daily.      Marland Kitchen amLODipine (NORVASC) 5 MG tablet Take 1 tablet (5 mg total) by mouth daily after supper.  30 tablet  9  . aspirin 81 MG chewable tablet Chew 81 mg by mouth at bedtime.        . clopidogrel (PLAVIX) 75 MG tablet Take 1 tablet (75 mg total) by mouth daily.  30 tablet  11  . glimepiride (AMARYL) 2 MG tablet Take 4 mg by mouth daily.       Marland Kitchen  hydrochlorothiazide (MICROZIDE) 12.5 MG capsule Take 12.5 mg by mouth every other day.        . irbesartan (AVAPRO) 300 MG tablet TAKE 1 TABLET AT BEDTIME  30 tablet  9  . isosorbide mononitrate (IMDUR) 30 MG 24 hr tablet TAKE 1/2 TABLET BY MOUTH EVERY DAY  15 tablet  5  . metFORMIN (GLUCOPHAGE) 500 MG tablet Take 1,000 mg by mouth 2 (two) times daily with a meal.       . metoprolol (LOPRESSOR) 50 MG tablet Take 1 tablet (50 mg total) by mouth 2 (two) times daily.  60 tablet  6  . omega-3 acid ethyl esters (LOVAZA) 1 G capsule Take 1 g by mouth 2 (two) times daily.       . pantoprazole (PROTONIX) 40 MG tablet TAKE 1 TABLET BY  MOUTH TWICE DAILY  60 tablet  3  . pantoprazole (PROTONIX) 40 MG tablet TAKE 1 TABLET BY MOUTH TWICE DAILY  60 tablet  3  . polysaccharide iron (NIFEREX) 150 MG CAPS capsule Take 150 mg by mouth 2 (two) times daily.        . pravastatin (PRAVACHOL) 40 MG tablet       . simvastatin (ZOCOR) 40 MG tablet TAKE 1 TABLET BY MOUTH AT BEDTIME  30 tablet  7   No current facility-administered medications for this visit.    Allergies:   No Known Allergies  Social History:  The patient  reports that he has quit smoking. His smoking use included Cigarettes. He has a 90 pack-year smoking history. He has never used smokeless tobacco. He reports that he does not drink alcohol or use illicit drugs.   Family history:   Family History  Problem Relation Age of Onset  . Colon cancer Neg Hx   . Diabetes Mother   . Heart disease Mother     ROS:  Please see the history of present illness.  All other systems reviewed and negative.   PHYSICAL EXAM: VS:  BP 140/70  Pulse 63  Ht 5\' 8"  (1.727 m)  Wt 194 lb (87.998 kg)  BMI 29.50 kg/m2 Well nourished, well developed, in no acute distress HEENT: Pupils are equal round react to light accommodation extraocular movements are intact.  Neck: no JVDNo cervical lymphadenopathy. Cardiac: Regular rate and rhythm 2/6 systolic murmur heard best at the LSB also heard at the axilla Lungs:  clear to auscultation bilaterally, no wheezing, rhonchi or rales Abd: soft, nontender, positive bowel sounds all quadrants, no hepatosplenomegaly Ext: Trace lower extremity edema.  2+ radial and dorsalis pedis pulses. Skin: warm and dry Neuro:  Grossly normal  EKG: Sinus rhythm first degree AV block nonspecific intraventricular block inferior T-wave flattening  ASSESSMENT AND PLAN:  Problem List Items Addressed This Visit   CORONARY ARTERY DISEASE (Chronic)     Appear stable. He does have some exertional dyspnea when walking up an incline. He seemed to improve since winter. No  signs of angina    Relevant Medications      pravastatin (PRAVACHOL) 40 MG tablet   HYPERLIPIDEMIA     As to the statin drugs listed on his medication list. We will check these when he gets home and make sure he is only taking one. I told him to take the simvastatin    Relevant Medications      pravastatin (PRAVACHOL) 40 MG tablet   Essential hypertension     BP is mildly elevated. No changes in current medications.    Relevant Medications  pravastatin (PRAVACHOL) 40 MG tablet    Other Visit Diagnoses   CAD (coronary artery disease)    -  Primary    Relevant Medications       pravastatin (PRAVACHOL) 40 MG tablet    Other Relevant Orders       EKG 12-Lead

## 2013-09-23 NOTE — Assessment & Plan Note (Signed)
BP is mildly elevated. No changes in current medications.

## 2013-11-05 ENCOUNTER — Other Ambulatory Visit: Payer: Self-pay | Admitting: Internal Medicine

## 2014-01-20 ENCOUNTER — Telehealth: Payer: Self-pay | Admitting: *Deleted

## 2014-01-20 NOTE — Telephone Encounter (Signed)
Form requesting cardiac clearance for cataract extraction.  From sent to Dr Gwenlyn Found for review

## 2014-01-21 NOTE — Telephone Encounter (Signed)
Dr Gwenlyn Found reviewed chart and authorized cataract surgery. Form signed and faxed back

## 2014-02-13 ENCOUNTER — Other Ambulatory Visit: Payer: Self-pay | Admitting: Cardiovascular Disease

## 2014-02-15 NOTE — Telephone Encounter (Signed)
Rx was sent to pharmacy electronically. 

## 2014-03-24 ENCOUNTER — Encounter: Payer: Self-pay | Admitting: Cardiovascular Disease

## 2014-03-24 ENCOUNTER — Ambulatory Visit (INDEPENDENT_AMBULATORY_CARE_PROVIDER_SITE_OTHER): Payer: Medicare Other | Admitting: Cardiovascular Disease

## 2014-03-24 VITALS — BP 128/70 | HR 46 | Ht 69.0 in | Wt 195.8 lb

## 2014-03-24 DIAGNOSIS — I10 Essential (primary) hypertension: Secondary | ICD-10-CM

## 2014-03-24 DIAGNOSIS — I2510 Atherosclerotic heart disease of native coronary artery without angina pectoris: Secondary | ICD-10-CM

## 2014-03-24 DIAGNOSIS — E785 Hyperlipidemia, unspecified: Secondary | ICD-10-CM

## 2014-03-24 NOTE — Assessment & Plan Note (Signed)
History of CAD status post acute inferior wall myocardial infarction treated with PCI and stenting by myself 10/15/1995. He had moderate LAD and circumflex disease as well as ramus disease at that time. Functional study performed in November 2011 showed inferior scar without ischemia unchanged from prior studies. He had re-intervention in his LAD and RCA 01/19/11. After this he became anemic and had a GI bleed requiring colonoscopy by Dr. Henrene Pastor. His PPIs were doubled. He is on dual antiplatelet therapy. He denies chest pain or shortness of breath.

## 2014-03-24 NOTE — Assessment & Plan Note (Signed)
History of hyperlipidemia on pravastatin 40 mg daily followed by his primary care physician. We will obtain his most recent lab work.

## 2014-03-24 NOTE — Progress Notes (Signed)
03/24/2014 Kevin Rhodes   January 25, 1934  [id number]  Primary Physician Florina Ou, MD Primary Cardiologist: Lorretta Harp MD [name]   HPI:  The patient is a delightful 80 year old, thin-appearing, widowed (wife passed away 75 months ago) Caucasian male, father of 41, grandfather to 7 grandchildren who is accompanied by one of his sons today. I saw him 12 months ago. He has a history of CAD status post inferior wall myocardial infarction, PCI and stenting by myself on October 15, 1995. He had moderate LAD and circumflex disease as well as ramus disease at that time. Functional studies performed November 2011 showed inferior scar without ischemia, unchanged from prior studies. His other problems include hypertension, hyperlipidemia and remote tobacco abuse, having quit back in 1997. He was complaining of exertional dyspnea with pain going to his jaws and arms prompting heart cath reveal LAD and RCA disease which I stented in a staged fashion using 9 drug-eluting stents. His symptoms resolved. He became anemic and had a GI bleed and underwent colonoscopy by Dr. Henrene Pastor revealing diverticulosis, esophageal strictures and ulcers, as well as an antral ulcer. PPIs were doubled. I did begin him on some Lexapro when I saw him last for situational depression which he did not tolerate, though he apparently is getting better over time.  Since I saw him he ago has remained stable. He does complain of some dyspnea but denies chest pain.   Current Outpatient Prescriptions  Medication Sig Dispense Refill  . amLODipine (NORVASC) 5 MG tablet Take 1 tablet (5 mg total) by mouth daily after supper. 30 tablet 9  . aspirin 81 MG chewable tablet Chew 81 mg by mouth at bedtime.      . clopidogrel (PLAVIX) 75 MG tablet Take 1 tablet (75 mg total) by mouth daily. 30 tablet 11  . glimepiride (AMARYL) 2 MG tablet Take 4 mg by mouth daily.     . hydrochlorothiazide (MICROZIDE) 12.5 MG capsule Take 12.5 mg  by mouth every other day.      . irbesartan (AVAPRO) 300 MG tablet TAKE 1 TABLET AT BEDTIME 30 tablet 9  . isosorbide mononitrate (IMDUR) 30 MG 24 hr tablet TAKE 1/2 TABLET BY MOUTH EVERY DAY 15 tablet 8  . metFORMIN (GLUCOPHAGE) 500 MG tablet Take 1,000 mg by mouth 2 (two) times daily with a meal.     . metoprolol (LOPRESSOR) 50 MG tablet Take 1 tablet (50 mg total) by mouth 2 (two) times daily. 60 tablet 6  . omega-3 acid ethyl esters (LOVAZA) 1 G capsule Take 1 g by mouth 2 (two) times daily.     . pantoprazole (PROTONIX) 40 MG tablet TAKE 1 TABLET BY MOUTH TWICE DAILY 60 tablet 3  . polysaccharide iron (NIFEREX) 150 MG CAPS capsule Take 150 mg by mouth 2 (two) times daily.      . pravastatin (PRAVACHOL) 40 MG tablet     . timolol (TIMOPTIC) 0.5 % ophthalmic solution      No current facility-administered medications for this visit.    No Known Allergies  History   Social History  . Marital Status: Married    Spouse Name: N/A    Number of Children: 3  . Years of Education: N/A   Occupational History  . retired    Social History Main Topics  . Smoking status: Former Smoker -- 2.00 packs/day for 45 years    Types: Cigarettes  . Smokeless tobacco: Never Used     Comment: "quit smoking  cigarettes ~ 1997"  . Alcohol Use: No  . Drug Use: No  . Sexual Activity: Not on file   Other Topics Concern  . Not on file   Social History Narrative     Review of Systems: General: negative for chills, fever, night sweats or weight changes.  Cardiovascular: negative for chest pain, dyspnea on exertion, edema, orthopnea, palpitations, paroxysmal nocturnal dyspnea or shortness of breath Dermatological: negative for rash Respiratory: negative for cough or wheezing Urologic: negative for hematuria Abdominal: negative for nausea, vomiting, diarrhea, bright red blood per rectum, melena, or hematemesis Neurologic: negative for visual changes, syncope, or dizziness All other systems reviewed  and are otherwise negative except as noted above.    Blood pressure 128/70, pulse 46, height 5\' 9"  (1.753 m), weight 195 lb 12.8 oz (88.814 kg).  General appearance: alert and no distress Neck: no adenopathy, no carotid bruit, no JVD, supple, symmetrical, trachea midline and thyroid not enlarged, symmetric, no tenderness/mass/nodules Lungs: clear to auscultation bilaterally Heart: regular rate and rhythm, S1, S2 normal, no murmur, click, rub or gallop Extremities: extremities normal, atraumatic, no cyanosis or edema  EKG sinus bradycardia 46 with inferior Q waves and nonspecific ST and T-wave changes. He does have a nonspecific IVCD as well. I personally reviewed this EKG  ASSESSMENT AND PLAN:   Coronary atherosclerosis History of CAD status post acute inferior wall myocardial infarction treated with PCI and stenting by myself 10/15/1995. He had moderate LAD and circumflex disease as well as ramus disease at that time. Functional study performed in November 2011 showed inferior scar without ischemia unchanged from prior studies. He had re-intervention in his LAD and RCA 01/19/11. After this he became anemic and had a GI bleed requiring colonoscopy by Dr. Henrene Pastor. His PPIs were doubled. He is on dual antiplatelet therapy. He denies chest pain or shortness of breath.  Essential hypertension History of hypertension with blood pressure measures today 120/70. He is on hydrochlorothiazide, Avapro, and metoprolol. We will continue current medications at current dosing  Hyperlipidemia History of hyperlipidemia on pravastatin 40 mg daily followed by his primary care physician. We will obtain his most recent lab work.      Lorretta Harp MD FACP,FACC,FAHA, [facility] 03/24/2014 9:15 AM

## 2014-03-24 NOTE — Patient Instructions (Signed)
Your physician wants you to follow-up in 1 year with Dr. Berry. You will receive a reminder letter in the mail 2 months in advance. If you do not receive a letter, please call our office to schedule the follow-up appointment.  

## 2014-03-24 NOTE — Assessment & Plan Note (Signed)
History of hypertension with blood pressure measures today 120/70. He is on hydrochlorothiazide, Avapro, and metoprolol. We will continue current medications at current dosing

## 2014-04-08 ENCOUNTER — Encounter: Payer: Self-pay | Admitting: Cardiovascular Disease

## 2014-04-14 ENCOUNTER — Other Ambulatory Visit: Payer: Self-pay | Admitting: Internal Medicine

## 2014-04-17 ENCOUNTER — Other Ambulatory Visit: Payer: Self-pay | Admitting: Cardiovascular Disease

## 2014-04-20 NOTE — Telephone Encounter (Signed)
Rx(s) sent to pharmacy electronically.  

## 2014-06-15 ENCOUNTER — Other Ambulatory Visit: Payer: Self-pay | Admitting: Cardiovascular Disease

## 2014-06-15 NOTE — Telephone Encounter (Signed)
Rx(s) sent to pharmacy electronically.  

## 2014-07-21 ENCOUNTER — Other Ambulatory Visit: Payer: Self-pay | Admitting: Cardiovascular Disease

## 2014-07-21 NOTE — Telephone Encounter (Signed)
Rx has been sent to the pharmacy electronically. ° °

## 2014-08-08 ENCOUNTER — Other Ambulatory Visit: Payer: Self-pay | Admitting: Cardiovascular Disease

## 2014-08-10 NOTE — Telephone Encounter (Signed)
Rx(s) sent to pharmacy electronically.  

## 2014-09-16 ENCOUNTER — Emergency Department (HOSPITAL_COMMUNITY): Payer: Medicare Other

## 2014-09-16 ENCOUNTER — Encounter (HOSPITAL_COMMUNITY): Payer: Self-pay | Admitting: Emergency Medicine

## 2014-09-16 ENCOUNTER — Emergency Department (HOSPITAL_COMMUNITY)
Admission: EM | Admit: 2014-09-16 | Discharge: 2014-09-16 | Disposition: A | Discharge status: Home / Self Care | Payer: Medicare Other | Attending: Emergency Medicine | Admitting: Emergency Medicine

## 2014-09-16 DIAGNOSIS — E119 Type 2 diabetes mellitus without complications: Secondary | ICD-10-CM | POA: Diagnosis not present

## 2014-09-16 DIAGNOSIS — Z7902 Long term (current) use of antithrombotics/antiplatelets: Secondary | ICD-10-CM | POA: Diagnosis not present

## 2014-09-16 DIAGNOSIS — I2510 Atherosclerotic heart disease of native coronary artery without angina pectoris: Secondary | ICD-10-CM | POA: Diagnosis not present

## 2014-09-16 DIAGNOSIS — I10 Essential (primary) hypertension: Secondary | ICD-10-CM | POA: Diagnosis not present

## 2014-09-16 DIAGNOSIS — M1990 Unspecified osteoarthritis, unspecified site: Secondary | ICD-10-CM | POA: Diagnosis not present

## 2014-09-16 DIAGNOSIS — Y998 Other external cause status: Secondary | ICD-10-CM | POA: Diagnosis not present

## 2014-09-16 DIAGNOSIS — Y9289 Other specified places as the place of occurrence of the external cause: Secondary | ICD-10-CM | POA: Insufficient documentation

## 2014-09-16 DIAGNOSIS — Z8701 Personal history of pneumonia (recurrent): Secondary | ICD-10-CM | POA: Insufficient documentation

## 2014-09-16 DIAGNOSIS — Z87442 Personal history of urinary calculi: Secondary | ICD-10-CM | POA: Diagnosis not present

## 2014-09-16 DIAGNOSIS — S61411A Laceration without foreign body of right hand, initial encounter: Secondary | ICD-10-CM | POA: Diagnosis not present

## 2014-09-16 DIAGNOSIS — Z9861 Coronary angioplasty status: Secondary | ICD-10-CM | POA: Diagnosis not present

## 2014-09-16 DIAGNOSIS — Z87891 Personal history of nicotine dependence: Secondary | ICD-10-CM | POA: Insufficient documentation

## 2014-09-16 DIAGNOSIS — Z951 Presence of aortocoronary bypass graft: Secondary | ICD-10-CM | POA: Diagnosis not present

## 2014-09-16 DIAGNOSIS — I252 Old myocardial infarction: Secondary | ICD-10-CM | POA: Insufficient documentation

## 2014-09-16 DIAGNOSIS — Z23 Encounter for immunization: Secondary | ICD-10-CM | POA: Insufficient documentation

## 2014-09-16 DIAGNOSIS — Z7982 Long term (current) use of aspirin: Secondary | ICD-10-CM | POA: Diagnosis not present

## 2014-09-16 DIAGNOSIS — Z8709 Personal history of other diseases of the respiratory system: Secondary | ICD-10-CM | POA: Diagnosis not present

## 2014-09-16 DIAGNOSIS — Z86018 Personal history of other benign neoplasm: Secondary | ICD-10-CM | POA: Diagnosis not present

## 2014-09-16 DIAGNOSIS — Z79899 Other long term (current) drug therapy: Secondary | ICD-10-CM | POA: Diagnosis not present

## 2014-09-16 DIAGNOSIS — E785 Hyperlipidemia, unspecified: Secondary | ICD-10-CM | POA: Diagnosis not present

## 2014-09-16 DIAGNOSIS — K219 Gastro-esophageal reflux disease without esophagitis: Secondary | ICD-10-CM | POA: Diagnosis not present

## 2014-09-16 DIAGNOSIS — W311XXA Contact with metalworking machines, initial encounter: Secondary | ICD-10-CM | POA: Insufficient documentation

## 2014-09-16 DIAGNOSIS — Y9389 Activity, other specified: Secondary | ICD-10-CM | POA: Insufficient documentation

## 2014-09-16 LAB — CBG MONITORING, ED: Glucose-Capillary: 134 mg/dL — ABNORMAL HIGH (ref 65–99)

## 2014-09-16 MED ORDER — LIDOCAINE-EPINEPHRINE (PF) 1 %-1:200000 IJ SOLN
20.0000 mL | Freq: Once | INTRAMUSCULAR | Status: AC
  Administered 2014-09-16: 20 mL via INTRADERMAL
  Filled 2014-09-16: qty 10

## 2014-09-16 MED ORDER — CEFAZOLIN SODIUM 1-5 GM-% IV SOLN
1.0000 g | Freq: Once | INTRAVENOUS | Status: AC
  Administered 2014-09-16: 1 g via INTRAVENOUS
  Filled 2014-09-16: qty 50

## 2014-09-16 MED ORDER — TETANUS-DIPHTH-ACELL PERTUSSIS 5-2.5-18.5 LF-MCG/0.5 IM SUSP
0.5000 mL | Freq: Once | INTRAMUSCULAR | Status: AC
  Administered 2014-09-16: 0.5 mL via INTRAMUSCULAR
  Filled 2014-09-16: qty 0.5

## 2014-09-16 MED ORDER — OXYCODONE-ACETAMINOPHEN 5-325 MG PO TABS: 1.0000 | ORAL_TABLET | ORAL | Status: DC | PRN

## 2014-09-16 MED ORDER — BACITRACIN ZINC 500 UNIT/GM EX OINT
TOPICAL_OINTMENT | CUTANEOUS | Status: AC
  Administered 2014-09-16: 1
  Filled 2014-09-16: qty 0.9

## 2014-09-16 MED ORDER — FENTANYL CITRATE (PF) 100 MCG/2ML IJ SOLN
100.0000 ug | Freq: Once | INTRAMUSCULAR | Status: AC
  Administered 2014-09-16: 100 ug via INTRAVENOUS
  Filled 2014-09-16: qty 2

## 2014-09-16 MED ORDER — SODIUM CHLORIDE 0.9 % IV BOLUS (SEPSIS)
500.0000 mL | Freq: Once | INTRAVENOUS | Status: AC
  Administered 2014-09-16: 500 mL via INTRAVENOUS

## 2014-09-16 MED ORDER — LIDOCAINE-EPINEPHRINE (PF) 1 %-1:200000 IJ SOLN
INTRAMUSCULAR | Status: AC
  Filled 2014-09-16: qty 10

## 2014-09-16 NOTE — ED Notes (Signed)
Pt cut right hand on metal grinder today. Laceration wrapped at present. C/m/s intact at present.

## 2014-09-16 NOTE — Discharge Instructions (Signed)
Elevate hand, keep dressing on for 48 hours, then change daily. Prescription for pain medicine.  Return for fever, chills, increasing redness, pus from wound.  You were given a tetanus shot tonight. Suture out in 10 days

## 2014-09-16 NOTE — ED Notes (Signed)
Patient hand cleaned around wound area at this time.

## 2014-09-16 NOTE — ED Notes (Signed)
MD at bedside. 

## 2014-09-16 NOTE — ED Provider Notes (Signed)
CSN: [id number]     Arrival date & time 09/16/14  1814 History   First MD Initiated Contact with Patient 09/16/14 1819     Chief Complaint  Patient presents with  . Laceration     (Consider location/radiation/quality/duration/timing/severity/associated sxs/prior Treatment) HPI.... Accidental laceration to the medial aspect of the right hand on a metal grinder today. No other injuries. Patient reports lots of bleeding. Last tetanus unknown. Severity is moderate.  Past Medical History  Diagnosis Date  . Internal hemorrhoids without mention of complication   . Stricture and stenosis of esophagus   . Unspecified sinusitis (chronic)   . Allergic rhinitis   . Diverticulosis of colon (without mention of hemorrhage)   . Benign neoplasm of colon   . Calculus of bile duct without mention of cholecystitis or obstruction   . Esophageal reflux   . Peptic ulcer, unspecified site, unspecified as acute or chronic, without mention of hemorrhage, perforation, or obstruction   . Other and unspecified hyperlipidemia   . Coronary atherosclerosis of unspecified type of vessel, native or graft   . Kidney stones   . Hypertension   . Myocardial infarction 09/1995  . Shortness of breath 03/06/11     "w/exertion"  . Pneumonia   . Diabetes mellitus     "pills"  . Arthritis   . Anemia   . Hemorrhoids   . Hyperlipidemia    Past Surgical History  Procedure Laterality Date  . Coronary angioplasty with stent placement  01/20/2011    "2"  . Coronary angioplasty with stent placement  1997    '1"  . Colonoscopy  01/26/11    severe left diverticulosis, internal hemorrhoids  . Esophagogastroduodenoscopy  01/26/11    esophageal stricture, antral ulcers - H. pylori bx negative  . Doppler echocardiography  2012, 2006  . Stress myocardial perfusion  2011  . Vascular uktrasound     Family History  Problem Relation Age of Onset  . Colon cancer Neg Hx   . Diabetes Mother   . Heart disease Mother    History   Substance Use Topics  . Smoking status: Former Smoker -- 2.00 packs/day for 45 years    Types: Cigarettes  . Smokeless tobacco: Never Used     Comment: "quit smoking cigarettes ~ 1997"  . Alcohol Use: No    Review of Systems  All other systems reviewed and are negative.     Allergies  Review of patient's allergies indicates no known allergies.  Home Medications   Prior to Admission medications   Medication Sig Start Date End Date Taking? Authorizing Provider  amLODipine (NORVASC) 5 MG tablet TAKE 1 TABLET BY MOUTH EVERY DAY AFTER SUPPER. 07/21/14   Lorretta Harp, MD  aspirin 81 MG chewable tablet Chew 81 mg by mouth at bedtime.      Historical Provider, MD  clopidogrel (PLAVIX) 75 MG tablet Take 1 tablet (75 mg total) by mouth daily. 04/20/14   Lorretta Harp, MD  glimepiride (AMARYL) 2 MG tablet Take 4 mg by mouth daily.  04/03/13   Historical Provider, MD  hydrochlorothiazide (MICROZIDE) 12.5 MG capsule Take 12.5 mg by mouth every other day.      Historical Provider, MD  irbesartan (AVAPRO) 300 MG tablet Take 1 tablet (300 mg total) by mouth at bedtime. 08/10/14   Lorretta Harp, MD  isosorbide mononitrate (IMDUR) 30 MG 24 hr tablet TAKE 1/2 TABLET BY MOUTH EVERY DAY 02/15/14   Lorretta Harp, MD  metFORMIN (GLUCOPHAGE)  500 MG tablet Take 1,000 mg by mouth 2 (two) times daily with a meal.     Historical Provider, MD  metoprolol (LOPRESSOR) 50 MG tablet Take 1 tablet (50 mg total) by mouth 2 (two) times daily. 06/15/14   Lorretta Harp, MD  omega-3 acid ethyl esters (LOVAZA) 1 G capsule Take 1 g by mouth 2 (two) times daily.     Historical Provider, MD  oxyCODONE-acetaminophen (PERCOCET) 5-325 MG per tablet Take 1-2 tablets by mouth every 4 (four) hours as needed. 09/16/14   Nat Christen, MD  pantoprazole (PROTONIX) 40 MG tablet TAKE 1 TABLET BY MOUTH TWICE DAILY 04/14/14   Irene Shipper, MD  polysaccharide iron (NIFEREX) 150 MG CAPS capsule Take 150 mg by mouth 2 (two) times  daily.      Historical Provider, MD  pravastatin (PRAVACHOL) 40 MG tablet  09/06/13   Historical Provider, MD  timolol (TIMOPTIC) 0.5 % ophthalmic solution  02/12/14   Historical Provider, MD   BP 140/82 mmHg  Pulse 72  Temp(Src) 98.3 F (36.8 C) (Oral)  Resp 16  Ht 5\' 9"  (1.753 m)  Wt 172 lb (78.019 kg)  BMI 25.39 kg/m2  SpO2 96% Physical Exam  Constitutional: He is oriented to person, place, and time. He appears well-developed and well-nourished.  HENT:  Head: Normocephalic and atraumatic.  Eyes: Conjunctivae and EOM are normal. Pupils are equal, round, and reactive to light.  Neck: Normal range of motion. Neck supple.  Cardiovascular: Normal rate and regular rhythm.   Pulmonary/Chest: Effort normal and breath sounds normal.  Abdominal: Soft. Bowel sounds are normal.  Musculoskeletal: Normal range of motion.  Neurological: He is alert and oriented to person, place, and time.  Skin:  Right hand: 0.5 cm deep laceration to the medial aspect of the palm.  Full range of motion of hand. No obvious neurovascular injury, no obvious tendon injury  Psychiatric: He has a normal mood and affect. His behavior is normal.  Nursing note and vitals reviewed.   ED Course  LACERATION REPAIR Date/Time: 09/16/2014 9:30 PM Performed by: Nat Christen Authorized by: Nat Christen Consent: Verbal consent obtained. Risks and benefits: risks, benefits and alternatives were discussed Consent given by: patient Patient understanding: patient states understanding of the procedure being performed Imaging studies: imaging studies available Body area: upper extremity Location details: right hand Laceration length: 2.5 cm Contamination: The wound is contaminated. Foreign bodies: no foreign bodies Tendon involvement: none Nerve involvement: none Vascular damage: no Anesthesia: local infiltration Local anesthetic: lidocaine 1% with epinephrine Anesthetic total: 8 ml Patient sedated: no Preparation:  Patient was prepped and draped in the usual sterile fashion. Irrigation solution: saline Irrigation method: syringe Amount of cleaning: extensive Debridement: none Degree of undermining: none Skin closure: 4-0 nylon Number of sutures: 5 Technique: simple Approximation: loose Approximation difficulty: simple Dressing: antibiotic ointment and gauze roll   (including critical care time) Labs Review Labs Reviewed  CBG MONITORING, ED - Abnormal; Notable for the following:    Glucose-Capillary 134 (*)    All other components within normal limits    Imaging Review Dg Hand Complete Right  09/16/2014   CLINICAL DATA:  Right hand laceration after being tangled in blade of bush hog. Initial encounter.  EXAM: RIGHT HAND - COMPLETE 3+ VIEW  COMPARISON:  None.  FINDINGS: There is no evidence of fracture or dislocation. No radiopaque foreign body is noted. Degenerative change of the first carpometacarpal joint is noted. Soft tissues are unremarkable.  IMPRESSION: No acute  abnormality seen in the right hand.   Electronically Signed   By: Marijo Conception, M.D.   On: 09/16/2014 18:58     EKG Interpretation None      MDM   Final diagnoses:  Laceration of right hand, initial encounter    Laceration repair of right hand injury. No obvious tendon injury. Discussed findings with patient and his family. Tetanus updated. 1 g Ancef given. Discharge medications Percocet. Instructed to the aware of potential for infection. Suture out 10 days    Nat Christen, MD 09/16/14 2134

## 2014-09-26 ENCOUNTER — Other Ambulatory Visit: Payer: Self-pay | Admitting: Internal Medicine

## 2014-09-26 ENCOUNTER — Emergency Department (HOSPITAL_COMMUNITY)
Admission: EM | Admit: 2014-09-26 | Discharge: 2014-09-26 | Disposition: A | Discharge status: Home / Self Care | Payer: Medicare Other | Attending: Emergency Medicine | Admitting: Emergency Medicine

## 2014-09-26 ENCOUNTER — Encounter (HOSPITAL_COMMUNITY): Payer: Self-pay | Admitting: *Deleted

## 2014-09-26 DIAGNOSIS — Z951 Presence of aortocoronary bypass graft: Secondary | ICD-10-CM | POA: Insufficient documentation

## 2014-09-26 DIAGNOSIS — Z4802 Encounter for removal of sutures: Secondary | ICD-10-CM

## 2014-09-26 DIAGNOSIS — Z8719 Personal history of other diseases of the digestive system: Secondary | ICD-10-CM | POA: Insufficient documentation

## 2014-09-26 DIAGNOSIS — I10 Essential (primary) hypertension: Secondary | ICD-10-CM | POA: Diagnosis not present

## 2014-09-26 DIAGNOSIS — I2510 Atherosclerotic heart disease of native coronary artery without angina pectoris: Secondary | ICD-10-CM | POA: Diagnosis not present

## 2014-09-26 DIAGNOSIS — Z7902 Long term (current) use of antithrombotics/antiplatelets: Secondary | ICD-10-CM | POA: Insufficient documentation

## 2014-09-26 DIAGNOSIS — Z862 Personal history of diseases of the blood and blood-forming organs and certain disorders involving the immune mechanism: Secondary | ICD-10-CM | POA: Diagnosis not present

## 2014-09-26 DIAGNOSIS — Z8701 Personal history of pneumonia (recurrent): Secondary | ICD-10-CM | POA: Diagnosis not present

## 2014-09-26 DIAGNOSIS — E785 Hyperlipidemia, unspecified: Secondary | ICD-10-CM | POA: Diagnosis not present

## 2014-09-26 DIAGNOSIS — Z79899 Other long term (current) drug therapy: Secondary | ICD-10-CM | POA: Diagnosis not present

## 2014-09-26 DIAGNOSIS — Z86018 Personal history of other benign neoplasm: Secondary | ICD-10-CM | POA: Insufficient documentation

## 2014-09-26 DIAGNOSIS — Z87442 Personal history of urinary calculi: Secondary | ICD-10-CM | POA: Insufficient documentation

## 2014-09-26 DIAGNOSIS — I252 Old myocardial infarction: Secondary | ICD-10-CM | POA: Diagnosis not present

## 2014-09-26 DIAGNOSIS — Z87891 Personal history of nicotine dependence: Secondary | ICD-10-CM | POA: Diagnosis not present

## 2014-09-26 DIAGNOSIS — Z7982 Long term (current) use of aspirin: Secondary | ICD-10-CM | POA: Insufficient documentation

## 2014-09-26 MED ORDER — BACITRACIN ZINC 500 UNIT/GM EX OINT
TOPICAL_OINTMENT | CUTANEOUS | Status: AC
  Administered 2014-09-26: 1 via TOPICAL
  Filled 2014-09-26: qty 0.9

## 2014-09-26 NOTE — Discharge Instructions (Signed)
Read the information below.  You may return to the Emergency Department at any time for worsening condition or any new symptoms that concern you.  If you develop redness, swelling, pus draining from the wound, or fevers greater than 100.4, return to the ER immediately for a recheck.     Suture Removal, Care After Refer to this sheet in the next few weeks. These instructions provide you with information on caring for yourself after your procedure. Your health care provider may also give you more specific instructions. Your treatment has been planned according to current medical practices, but problems sometimes occur. Call your health care provider if you have any problems or questions after your procedure. WHAT TO EXPECT AFTER THE PROCEDURE After your stitches (sutures) are removed, it is typical to have the following:  Some discomfort and swelling in the wound area.  Slight redness in the area. HOME CARE INSTRUCTIONS   If you have skin adhesive strips over the wound area, do not take the strips off. They will fall off on their own in a few days. If the strips remain in place after 14 days, you may remove them.  Change any bandages (dressings) at least once a day or as directed by your health care provider. If the bandage sticks, soak it off with warm, soapy water.  Apply cream or ointment only as directed by your health care provider. If using cream or ointment, wash the area with soap and water 2 times a day to remove all the cream or ointment. Rinse off the soap and pat the area dry with a clean towel.  Keep the wound area dry and clean. If the bandage becomes wet or dirty, or if it develops a bad smell, change it as soon as possible.  Continue to protect the wound from injury.  Use sunscreen when out in the sun. New scars become sunburned easily. SEEK MEDICAL CARE IF:  You have increasing redness, swelling, or pain in the wound.  You see pus coming from the wound.  You have a  fever.  You notice a bad smell coming from the wound or dressing.  Your wound breaks open (edges not staying together). Document Released: 01/03/2001 Document Revised: 01/29/2013 Document Reviewed: 11/20/2012 [facility] Patient Information 2015 [place], [place]. This information is not intended to replace advice given to you by your health care provider. Make sure you discuss any questions you have with your health care provider.

## 2014-09-26 NOTE — ED Notes (Signed)
Sutures placed to right hand on 5/25. Here to have sutures removed.

## 2014-09-26 NOTE — ED Provider Notes (Signed)
CSN: [id number]     Arrival date & time 09/26/14  0807 History   First MD Initiated Contact with Patient 09/26/14 0815     Chief Complaint  Patient presents with  . Suture / Staple Removal     (Consider location/radiation/quality/duration/timing/severity/associated sxs/prior Treatment) HPI   Patient presents 10 days after suture placement for removal.  Pt accidentally cut his right hand on a grinder.  Has had no problems with the wound and feels it is healing well.  Denies fevers, chills, redness, bleeding, or drainage from the wound.   Has not taken his blood pressure medication in two days because he needs to pick up his prescription in [place] - denies CP, SOB, HA.  States he is feeling very well.    Past Medical History  Diagnosis Date  . Internal hemorrhoids without mention of complication   . Stricture and stenosis of esophagus   . Unspecified sinusitis (chronic)   . Allergic rhinitis   . Diverticulosis of colon (without mention of hemorrhage)   . Benign neoplasm of colon   . Calculus of bile duct without mention of cholecystitis or obstruction   . Esophageal reflux   . Peptic ulcer, unspecified site, unspecified as acute or chronic, without mention of hemorrhage, perforation, or obstruction   . Other and unspecified hyperlipidemia   . Coronary atherosclerosis of unspecified type of vessel, native or graft   . Kidney stones   . Hypertension   . Myocardial infarction 09/1995  . Shortness of breath 03/06/11     "w/exertion"  . Pneumonia   . Diabetes mellitus     "pills"  . Arthritis   . Anemia   . Hemorrhoids   . Hyperlipidemia    Past Surgical History  Procedure Laterality Date  . Coronary angioplasty with stent placement  01/20/2011    "2"  . Coronary angioplasty with stent placement  1997    '1"  . Colonoscopy  01/26/11    severe left diverticulosis, internal hemorrhoids  . Esophagogastroduodenoscopy  01/26/11    esophageal stricture, antral ulcers - H. pylori  bx negative  . Doppler echocardiography  2012, 2006  . Stress myocardial perfusion  2011  . Vascular uktrasound     Family History  Problem Relation Age of Onset  . Colon cancer Neg Hx   . Diabetes Mother   . Heart disease Mother    History  Substance Use Topics  . Smoking status: Former Smoker -- 2.00 packs/day for 45 years    Types: Cigarettes  . Smokeless tobacco: Never Used     Comment: "quit smoking cigarettes ~ 1997"  . Alcohol Use: No    Review of Systems  Constitutional: Negative for fever and chills.  Respiratory: Negative for shortness of breath.   Cardiovascular: Negative for chest pain.  Musculoskeletal: Negative for joint swelling and arthralgias.  Skin: Positive for wound. Negative for color change, pallor and rash.  Allergic/Immunologic: Positive for immunocompromised state (diabetic).  Neurological: Negative for weakness and numbness.  Hematological: Bruises/bleeds easily (on plavix ).  Psychiatric/Behavioral: Positive for self-injury (accidental).      Allergies  Review of patient's allergies indicates no known allergies.  Home Medications   Prior to Admission medications   Medication Sig Start Date End Date Taking? Authorizing Provider  amLODipine (NORVASC) 5 MG tablet TAKE 1 TABLET BY MOUTH EVERY DAY AFTER SUPPER. 07/21/14   Lorretta Harp, MD  aspirin 81 MG chewable tablet Chew 81 mg by mouth at bedtime.  Historical Provider, MD  clopidogrel (PLAVIX) 75 MG tablet Take 1 tablet (75 mg total) by mouth daily. 04/20/14   Lorretta Harp, MD  glimepiride (AMARYL) 2 MG tablet Take 4 mg by mouth daily.  04/03/13   Historical Provider, MD  hydrochlorothiazide (MICROZIDE) 12.5 MG capsule Take 12.5 mg by mouth every other day.      Historical Provider, MD  irbesartan (AVAPRO) 300 MG tablet Take 1 tablet (300 mg total) by mouth at bedtime. 08/10/14   Lorretta Harp, MD  isosorbide mononitrate (IMDUR) 30 MG 24 hr tablet TAKE 1/2 TABLET BY MOUTH EVERY DAY  02/15/14   Lorretta Harp, MD  metFORMIN (GLUCOPHAGE) 500 MG tablet Take 1,000 mg by mouth 2 (two) times daily with a meal.     Historical Provider, MD  metoprolol (LOPRESSOR) 50 MG tablet Take 1 tablet (50 mg total) by mouth 2 (two) times daily. 06/15/14   Lorretta Harp, MD  omega-3 acid ethyl esters (LOVAZA) 1 G capsule Take 1 g by mouth 2 (two) times daily.     Historical Provider, MD  oxyCODONE-acetaminophen (PERCOCET) 5-325 MG per tablet Take 1-2 tablets by mouth every 4 (four) hours as needed. 09/16/14   Nat Christen, MD  pantoprazole (PROTONIX) 40 MG tablet TAKE 1 TABLET BY MOUTH TWICE DAILY 04/14/14   Irene Shipper, MD  polysaccharide iron (NIFEREX) 150 MG CAPS capsule Take 150 mg by mouth 2 (two) times daily.      Historical Provider, MD  pravastatin (PRAVACHOL) 40 MG tablet  09/06/13   Historical Provider, MD  timolol (TIMOPTIC) 0.5 % ophthalmic solution  02/12/14   Historical Provider, MD   BP 192/91 mmHg  Pulse 72  Temp(Src) 97.6 F (36.4 C) (Oral)  Resp 18  SpO2 97% Physical Exam  Constitutional: He appears well-developed and well-nourished. No distress.  HENT:  Head: Normocephalic and atraumatic.  Eyes: Conjunctivae are normal.  Neck: Neck supple.  Cardiovascular: Normal rate.   Pulmonary/Chest: Effort normal.  Neurological: He is alert. He exhibits normal muscle tone.  Skin: He is not diaphoretic. No erythema.  Right hand over ulnar/dorsal aspect with healing laceration.  No erythema, edema, warmth, discharge, or tenderness.  5 sutures in place, healing well.    Psychiatric: He has a normal mood and affect. His behavior is normal.  Nursing note and vitals reviewed.   ED Course  Procedures (including critical care time) Labs Review Labs Reviewed - No data to display  Imaging Review No results found.   EKG Interpretation None       SUTURE REMOVAL Performed by: Clayton Bibles  Consent: Verbal consent obtained. Patient identity confirmed: provided demographic  data Time out: Immediately prior to procedure a "time out" was called to verify the correct patient, procedure, equipment, support staff and site/side marked as required.  Location details: right hand  Wound Appearance: clean  Sutures/Staples Removed: 5  Facility: sutures placed in this facility Patient tolerance: Patient tolerated the procedure well with no immediate complications.     MDM   Final diagnoses:  Visit for suture removal    Afebrile, nontoxic patient with laceration repair of right hand 10 days ago, healing well. Sutures removed in ED and wound care performed by nurse.   D/C home with wound care instructions, PCP follow up PRN.  Discussed result, findings, treatment, and follow up  with patient.  Pt given return precautions.  Pt verbalizes understanding and agrees with plan.         Raquel Sarna  [place], PA-C 09/26/14 [place], MD 09/26/14 [phone]

## 2014-11-24 ENCOUNTER — Telehealth: Payer: Self-pay | Admitting: *Deleted

## 2014-11-24 ENCOUNTER — Other Ambulatory Visit: Payer: Self-pay | Admitting: Hematology and Oncology

## 2014-11-24 ENCOUNTER — Telehealth: Payer: Self-pay | Admitting: Hematology and Oncology

## 2014-11-24 ENCOUNTER — Other Ambulatory Visit (HOSPITAL_BASED_OUTPATIENT_CLINIC_OR_DEPARTMENT_OTHER): Payer: Medicare Other

## 2014-11-24 DIAGNOSIS — D539 Nutritional anemia, unspecified: Secondary | ICD-10-CM

## 2014-11-24 DIAGNOSIS — D509 Iron deficiency anemia, unspecified: Secondary | ICD-10-CM | POA: Diagnosis present

## 2014-11-24 LAB — CBC & DIFF AND RETIC
BASO%: 0.2 % (ref 0.0–2.0)
Basophils Absolute: 0 10*3/uL (ref 0.0–0.1)
EOS ABS: 0.5 10*3/uL (ref 0.0–0.5)
EOS%: 7.7 % — ABNORMAL HIGH (ref 0.0–7.0)
HCT: 34.5 % — ABNORMAL LOW (ref 38.4–49.9)
HGB: 11.5 g/dL — ABNORMAL LOW (ref 13.0–17.1)
Immature Retic Fract: 6.4 % (ref 3.00–10.60)
LYMPH%: 30.8 % (ref 14.0–49.0)
MCH: 29.6 pg (ref 27.2–33.4)
MCHC: 33.3 g/dL (ref 32.0–36.0)
MCV: 88.7 fL (ref 79.3–98.0)
MONO#: 0.4 10*3/uL (ref 0.1–0.9)
MONO%: 7.4 % (ref 0.0–14.0)
NEUT%: 53.9 % (ref 39.0–75.0)
NEUTROS ABS: 3.2 10*3/uL (ref 1.5–6.5)
Platelets: 158 10*3/uL (ref 140–400)
RBC: 3.89 10*6/uL — ABNORMAL LOW (ref 4.20–5.82)
RDW: 14.3 % (ref 11.0–14.6)
RETIC CT ABS: 51.74 10*3/uL (ref 34.80–93.90)
Retic %: 1.33 % (ref 0.80–1.80)
WBC: 5.8 10*3/uL (ref 4.0–10.3)
lymph#: 1.8 10*3/uL (ref 0.9–3.3)

## 2014-11-24 NOTE — Telephone Encounter (Signed)
I placed POF and order for labs It;s for today if he can't make it you need to change it

## 2014-11-24 NOTE — Telephone Encounter (Signed)
Pt left VM states feels like his "blood is low" and asks if he can come in for lab?

## 2014-11-24 NOTE — Telephone Encounter (Signed)
lvm fo rpt regarding to todays lab

## 2014-11-25 ENCOUNTER — Telehealth: Payer: Self-pay | Admitting: *Deleted

## 2014-11-25 LAB — IRON AND TIBC CHCC
%SAT: 34 % (ref 20–55)
Iron: 118 ug/dL (ref 42–163)
TIBC: 346 ug/dL (ref 202–409)
UIBC: 229 ug/dL (ref 117–376)

## 2014-11-25 LAB — FERRITIN CHCC: Ferritin: 44 ng/ml (ref 22–316)

## 2014-11-25 NOTE — Telephone Encounter (Signed)
Informed pt of Dr.  Calton Dach message below.  Instructed if he continues to feel more tired than usual he should f/u w/ his PCP.  Faxed labs to [facility] in [place] fax [phone].   Pt verbalized understanding.

## 2014-11-25 NOTE — Telephone Encounter (Signed)
-----   Message from Heath Lark, MD sent at 11/25/2014  9:57 AM EDT ----- Regarding: labs Pls let him know he is a little anemic but not much changed compared to last year He can try iron suplement 1 tab daily at night ----- Message -----    From: [facility]    Sent: 11/24/2014   3:48 PM      To: Heath Lark, MD

## 2015-02-27 ENCOUNTER — Other Ambulatory Visit: Payer: Self-pay | Admitting: Cardiovascular Disease

## 2015-03-10 ENCOUNTER — Ambulatory Visit (INDEPENDENT_AMBULATORY_CARE_PROVIDER_SITE_OTHER): Payer: Medicare Other | Admitting: Cardiovascular Disease

## 2015-03-10 ENCOUNTER — Encounter: Payer: Self-pay | Admitting: Cardiovascular Disease

## 2015-03-10 VITALS — BP 156/82 | HR 54 | Ht 68.0 in | Wt 197.0 lb

## 2015-03-10 DIAGNOSIS — I10 Essential (primary) hypertension: Secondary | ICD-10-CM

## 2015-03-10 DIAGNOSIS — I2510 Atherosclerotic heart disease of native coronary artery without angina pectoris: Secondary | ICD-10-CM | POA: Diagnosis not present

## 2015-03-10 DIAGNOSIS — E785 Hyperlipidemia, unspecified: Secondary | ICD-10-CM | POA: Diagnosis not present

## 2015-03-10 NOTE — Assessment & Plan Note (Signed)
History of coronary artery disease status post inferior wall myocardial infarction treated with PCI and stenting by myself 10/15/95. He had moderate LAD and circumflex disease as well as ramus disease at that time. Functional studies performed 11/11 showed inferior scar without ischemia. I restudied him subsequent to that revealing high-grade LAD and RCA disease which I stented in a staged fashion using 9 drug-eluting stents. His symptoms resolved. He did have subsequent issues with GI bleeding related to diverticulosis, ulcers and esophageal strictures which have since resolved on PPI therapy.

## 2015-03-10 NOTE — Progress Notes (Signed)
03/10/2015 Kevin Rhodes   Mar 25, 1934  [id number]  Primary Physician Florina Ou, MD Primary Cardiologist: Lorretta Harp MD [name]   HPI:  The patient is a delightful 81 year old, thin-appearing, widowed (wife passed away 3 years ago) Caucasian male, father of 10, grandfather to 7 grandchildren who I saw him 12 months ago. He has a history of CAD status post inferior wall myocardial infarction, PCI and stenting by myself on October 15, 1995. He had moderate LAD and circumflex disease as well as ramus disease at that time. Functional studies performed November 2011 showed inferior scar without ischemia, unchanged from prior studies. His other problems include hypertension, hyperlipidemia and remote tobacco abuse, having quit back in 1997. He was complaining of exertional dyspnea with pain going to his jaws and arms prompting heart cath reveal LAD and RCA disease which I stented in a staged fashion using 9 drug-eluting stents. His symptoms resolved. He became anemic and had a GI bleed and underwent colonoscopy by Dr. Henrene Pastor revealing diverticulosis, esophageal strictures and ulcers, as well as an antral ulcer. PPIs were doubled. I did begin him on some Lexapro when I saw him last for situational depression which he did not tolerate, though he apparently is getting better over time.  Since I saw him he ago has remained stable. He does complain of some dyspnea but denies chest pain.   Current Outpatient Prescriptions  Medication Sig Dispense Refill  . amLODipine (NORVASC) 5 MG tablet TAKE 1 TABLET BY MOUTH EVERY DAY AFTER SUPPER. 30 tablet 8  . aspirin 81 MG chewable tablet Chew 81 mg by mouth at bedtime.      . clopidogrel (PLAVIX) 75 MG tablet Take 1 tablet (75 mg total) by mouth daily. 30 tablet 11  . glimepiride (AMARYL) 2 MG tablet Take 4 mg by mouth daily.     . hydrochlorothiazide (MICROZIDE) 12.5 MG capsule Take 12.5 mg by mouth every other day.      . irbesartan  (AVAPRO) 300 MG tablet Take 1 tablet (300 mg total) by mouth at bedtime. 30 tablet 8  . isosorbide mononitrate (IMDUR) 30 MG 24 hr tablet TAKE 1/2 TABLET BY MOUTH EVERY DAY 15 tablet 0  . metFORMIN (GLUCOPHAGE) 500 MG tablet Take 1,000 mg by mouth 2 (two) times daily with a meal.     . metoprolol (LOPRESSOR) 50 MG tablet Take 1 tablet (50 mg total) by mouth 2 (two) times daily. 60 tablet 10  . omega-3 acid ethyl esters (LOVAZA) 1 G capsule Take 1 g by mouth 2 (two) times daily.     Marland Kitchen oxyCODONE-acetaminophen (PERCOCET) 5-325 MG per tablet Take 1-2 tablets by mouth every 4 (four) hours as needed. 20 tablet 0  . pantoprazole (PROTONIX) 40 MG tablet TAKE 1 TABLET BY MOUTH TWICE DAILY 60 tablet 3  . polysaccharide iron (NIFEREX) 150 MG CAPS capsule Take 150 mg by mouth 2 (two) times daily.      . pravastatin (PRAVACHOL) 40 MG tablet     . timolol (TIMOPTIC) 0.5 % ophthalmic solution      No current facility-administered medications for this visit.    No Known Allergies  Social History   Social History  . Marital Status: Married    Spouse Name: N/A  . Number of Children: 3  . Years of Education: N/A   Occupational History  . retired    Social History Main Topics  . Smoking status: Former Smoker -- 2.00 packs/day for 45 years  Types: Cigarettes  . Smokeless tobacco: Never Used     Comment: "quit smoking cigarettes ~ 1997"  . Alcohol Use: No  . Drug Use: No  . Sexual Activity: Not on file   Other Topics Concern  . Not on file   Social History Narrative     Review of Systems: General: negative for chills, fever, night sweats or weight changes.  Cardiovascular: negative for chest pain, dyspnea on exertion, edema, orthopnea, palpitations, paroxysmal nocturnal dyspnea or shortness of breath Dermatological: negative for rash Respiratory: negative for cough or wheezing Urologic: negative for hematuria Abdominal: negative for nausea, vomiting, diarrhea, bright red blood per rectum,  melena, or hematemesis Neurologic: negative for visual changes, syncope, or dizziness All other systems reviewed and are otherwise negative except as noted above.    Blood pressure 156/82, pulse 54, height 5\' 8"  (1.727 m), weight 197 lb (89.359 kg).  General appearance: alert and no distress Neck: no adenopathy, no JVD, supple, symmetrical, trachea midline, thyroid not enlarged, symmetric, no tenderness/mass/nodules and soft bilateral carotid bruits Lungs: clear to auscultation bilaterally Heart: ssoft outflow tract murmur consistent with aortic stenosis and/or sclerosis Extremities: extremities normal, atraumatic, no cyanosis or edema  EKG sinus bradycardia at 54 with inferior Q waves, evidence of LVH with nonspecific QRS widening. He did have reverse R-wave progression as well. I personally reviewed this EKG  ASSESSMENT AND PLAN:   Hyperlipidemia History of hyperlipidemia on pravastatin 40 mg a day followed by his PCP  Essential hypertension History of hypertension blood pressure measurements weight 156/82. He is on amlodipine, hydrochlorothiazide, Avapro and metoprolol. Continue current meds at current dosing  Coronary atherosclerosis History of coronary artery disease status post inferior wall myocardial infarction treated with PCI and stenting by myself 10/15/95. He had moderate LAD and circumflex disease as well as ramus disease at that time. Functional studies performed 11/11 showed inferior scar without ischemia. I restudied him subsequent to that revealing high-grade LAD and RCA disease which I stented in a staged fashion using 9 drug-eluting stents. His symptoms resolved. He did have subsequent issues with GI bleeding related to diverticulosis, ulcers and esophageal strictures which have since resolved on PPI therapy.      Lorretta Harp MD FACP,FACC,FAHA, [facility] 03/10/2015 11:05 AM

## 2015-03-10 NOTE — Patient Instructions (Signed)

## 2015-03-10 NOTE — Assessment & Plan Note (Signed)
History of hypertension blood pressure measurements weight 156/82. He is on amlodipine, hydrochlorothiazide, Avapro and metoprolol. Continue current meds at current dosing

## 2015-03-10 NOTE — Assessment & Plan Note (Signed)
History of hyperlipidemia on pravastatin 40 mg a day followed by his PCP 

## 2015-03-15 ENCOUNTER — Other Ambulatory Visit: Payer: Self-pay | Admitting: Internal Medicine

## 2015-04-09 ENCOUNTER — Other Ambulatory Visit: Payer: Self-pay | Admitting: Cardiovascular Disease

## 2015-04-19 ENCOUNTER — Other Ambulatory Visit: Payer: Self-pay | Admitting: Cardiovascular Disease

## 2015-04-20 NOTE — Telephone Encounter (Signed)
Rx request sent to pharmacy.  

## 2015-04-28 ENCOUNTER — Other Ambulatory Visit: Payer: Self-pay | Admitting: Cardiovascular Disease

## 2015-04-28 NOTE — Telephone Encounter (Signed)
Rx request sent to pharmacy.  

## 2015-05-05 ENCOUNTER — Other Ambulatory Visit: Payer: Self-pay | Admitting: Cardiovascular Disease

## 2015-05-05 NOTE — Telephone Encounter (Signed)
Rx request sent to pharmacy.  

## 2015-06-13 ENCOUNTER — Other Ambulatory Visit: Payer: Self-pay | Admitting: Cardiovascular Disease

## 2015-06-14 NOTE — Telephone Encounter (Signed)
Rx(s) sent to pharmacy electronically.  

## 2015-07-28 ENCOUNTER — Other Ambulatory Visit: Payer: Self-pay | Admitting: Cardiovascular Disease

## 2015-07-28 MED ORDER — IRBESARTAN 300 MG PO TABS: 300.0000 mg | ORAL_TABLET | Freq: Every day | ORAL | Status: DC

## 2015-07-28 NOTE — Telephone Encounter (Signed)
Rx(s) sent to pharmacy electronically.  

## 2015-07-29 ENCOUNTER — Other Ambulatory Visit: Payer: Self-pay | Admitting: Cardiovascular Disease

## 2015-07-29 NOTE — Telephone Encounter (Signed)
Rx refill sent to pharmacy. 

## 2015-08-28 ENCOUNTER — Other Ambulatory Visit: Payer: Self-pay | Admitting: Cardiovascular Disease

## 2015-08-30 NOTE — Telephone Encounter (Signed)
Rx request sent to pharmacy.  

## 2015-09-07 ENCOUNTER — Telehealth: Payer: Self-pay | Admitting: Cardiovascular Disease

## 2015-09-07 NOTE — Telephone Encounter (Signed)
New message    Daughter calling     Pt c/o Shortness Of Breath: STAT if SOB developed within the last 24 hours or pt is noticeably SOB on the phone  1. Are you currently SOB (can you hear that pt is SOB on the phone)? Daughter / having trouble breathing. / not with daughter now.    2. How long have you been experiencing SOB? Several weeks.    3. Are you SOB when sitting or when up moving around? Moving around    4. Are you currently experiencing any other symptoms? No energy / skin does not look right / down hill the last couple of weeks.

## 2015-09-07 NOTE — Telephone Encounter (Signed)
Pt daughter called in, pt okay to talk with daughter. Daughter states that pt has been SOB when walking around the house and yard for the past few weeks. Pt has some LE swelling but she is unsure if it is pitting or not.  Pt also has c/o of dizziness and lightheadness from time to time. Pt encouraged to call his PCP to be evaluated but pt has an new PCP but he will just see Dr. Gwenlyn Found, as that is only one he trust. Pt daughter states that pt does not look right as he is pale since he is so SOB.  Pt has no /o of chest pain or pressure. Pt scheduled to see Dr. Gwenlyn Found on 5/19 @ 10:15am. Pt aware to bring in with him BP readings and medication.  No additional questions at this time.

## 2015-09-10 ENCOUNTER — Encounter: Payer: Self-pay | Admitting: Cardiovascular Disease

## 2015-09-10 ENCOUNTER — Ambulatory Visit (INDEPENDENT_AMBULATORY_CARE_PROVIDER_SITE_OTHER): Payer: Medicare Other | Admitting: Cardiovascular Disease

## 2015-09-10 VITALS — BP 126/62 | HR 54 | Ht 68.0 in | Wt 195.0 lb

## 2015-09-10 DIAGNOSIS — I10 Essential (primary) hypertension: Secondary | ICD-10-CM

## 2015-09-10 DIAGNOSIS — R0602 Shortness of breath: Secondary | ICD-10-CM | POA: Diagnosis not present

## 2015-09-10 DIAGNOSIS — I2510 Atherosclerotic heart disease of native coronary artery without angina pectoris: Secondary | ICD-10-CM | POA: Diagnosis not present

## 2015-09-10 DIAGNOSIS — R011 Cardiac murmur, unspecified: Secondary | ICD-10-CM

## 2015-09-10 DIAGNOSIS — Z79899 Other long term (current) drug therapy: Secondary | ICD-10-CM

## 2015-09-10 NOTE — Progress Notes (Signed)
09/10/2015 Kevin Rhodes   Dec 27, 1933  [id number]  Primary Physician [name], PA-C Primary Cardiologist: Lorretta Harp MD [name]   HPI:  The patient is a delightful 82 year old, thin-appearing, widowed (wife passed away 3 years ago) Caucasian male, father of 97, grandfather to 7 grandchildren who I saw 03/10/15.Marland Kitchen He has a history of CAD status post inferior wall myocardial infarction, PCI and stenting by myself on October 15, 1995. He had moderate LAD and circumflex disease as well as ramus disease at that time. Functional studies performed November 2011 showed inferior scar without ischemia, unchanged from prior studies. His other problems include hypertension, hyperlipidemia and remote tobacco abuse, having quit back in 1997. He was complaining of exertional dyspnea with pain going to his jaws and arms prompting heart cath reveal LAD and RCA disease which I stented in a staged fashion using 9 drug-eluting stents. His symptoms resolved. He became anemic and had a GI bleed and underwent colonoscopy by Dr. Henrene Pastor revealing diverticulosis, esophageal strictures and ulcers, as well as an antral ulcer. PPIs were doubled. I did begin him on some Lexapro when I saw him last for situational depression which he did not tolerate, though he apparently is getting better over time.  Since I saw him in the office 6 months ago he developed new onset dyspnea on exertion over the last 3 months with generalized weakness. He still denies chest pain.     Current Outpatient Prescriptions  Medication Sig Dispense Refill  . amLODipine (NORVASC) 5 MG tablet TAKE 1 TABLET BY MOUTH EVERY DAY AFTER SUPPER. 30 tablet 8  . aspirin 81 MG chewable tablet Chew 81 mg by mouth at bedtime.      . clopidogrel (PLAVIX) 75 MG tablet TAKE 1 TABLET (75 MG TOTAL) BY MOUTH DAILY. 30 tablet 9  . glimepiride (AMARYL) 2 MG tablet Take 4 mg by mouth daily.     . hydrochlorothiazide (MICROZIDE) 12.5 MG capsule TAKE  ONE CAPSULE BY MOUTH EVERY DAY 90 capsule 2  . irbesartan (AVAPRO) 300 MG tablet TAKE 1 TABLET (300 MG TOTAL) BY MOUTH AT BEDTIME. 30 tablet 8  . isosorbide mononitrate (IMDUR) 30 MG 24 hr tablet TAKE 1/2 TABLET BY MOUTH EVERY DAY *KEEP APPT* 15 tablet 10  . metFORMIN (GLUCOPHAGE) 500 MG tablet Take 1,000 mg by mouth 2 (two) times daily with a meal.     . metoprolol (LOPRESSOR) 50 MG tablet TAKE 1 TABLET (50 MG TOTAL) BY MOUTH 2 (TWO) TIMES DAILY. 60 tablet 9  . omega-3 acid ethyl esters (LOVAZA) 1 G capsule Take 1 g by mouth 2 (two) times daily.     Marland Kitchen oxyCODONE-acetaminophen (PERCOCET) 5-325 MG per tablet Take 1-2 tablets by mouth every 4 (four) hours as needed. 20 tablet 0  . pantoprazole (PROTONIX) 40 MG tablet TAKE 1 TABLET BY MOUTH TWICE DAILY 60 tablet 3  . polysaccharide iron (NIFEREX) 150 MG CAPS capsule Take 150 mg by mouth 2 (two) times daily.      . pravastatin (PRAVACHOL) 40 MG tablet     . timolol (TIMOPTIC) 0.5 % ophthalmic solution      No current facility-administered medications for this visit.    No Known Allergies  Social History   Social History  . Marital Status: Married    Spouse Name: N/A  . Number of Children: 3  . Years of Education: N/A   Occupational History  . retired    Social History Main Topics  . Smoking  status: Former Smoker -- 2.00 packs/day for 45 years    Types: Cigarettes  . Smokeless tobacco: Never Used     Comment: "quit smoking cigarettes ~ 1997"  . Alcohol Use: No  . Drug Use: No  . Sexual Activity: Not on file   Other Topics Concern  . Not on file   Social History Narrative     Review of Systems: General: negative for chills, fever, night sweats or weight changes.  Cardiovascular: negative for chest pain, dyspnea on exertion, edema, orthopnea, palpitations, paroxysmal nocturnal dyspnea or shortness of breath Dermatological: negative for rash Respiratory: negative for cough or wheezing Urologic: negative for  hematuria Abdominal: negative for nausea, vomiting, diarrhea, bright red blood per rectum, melena, or hematemesis Neurologic: negative for visual changes, syncope, or dizziness All other systems reviewed and are otherwise negative except as noted above.    Blood pressure 126/62, pulse 54, height 5\' 8"  (1.727 m), weight 195 lb (88.451 kg), SpO2 96 %.  General appearance: alert and no distress Neck: no adenopathy, no carotid bruit, no JVD, supple, symmetrical, trachea midline and thyroid not enlarged, symmetric, no tenderness/mass/nodules Lungs: clear to auscultation bilaterally Heart: soft outflow tract murmur at the baseconsistent with aortic stenosis and/or sclerosis. Extremities: extremities normal, atraumatic, no cyanosis or edema  EKG sinus bradycardia at 54 with inferior Q waves and poor R-wave progression. I personally reviewed this EKG  ASSESSMENT AND PLAN:   Coronary atherosclerosis History of CAD status post acute inferior wall myocardial infarction she with PCI stenting by myself 10/15/1995 He did have moderate LAD and circumflex disease as well at that time. He had another cath in 2011 treated with additional stenting. Over the last 3 months he's had progressive weakness and dyspnea on exertion. I'm going to get a 2-D echocardiogram and a pharmacologic  Myoview stress test to rule out an ischemic etiology.  HYPERLIPIDEMIA History of hyperlipidemia on statin therapy followed by his PCP  Essential hypertension History of hypertension blood pressure measured 126/62. He is on amlodipine, hydrochlorothiazide , metoprolol, and Avapro.      Lorretta Harp MD FACP,FACC,FAHA, [facility] 09/10/2015 3:08 PM

## 2015-09-10 NOTE — Assessment & Plan Note (Signed)
History of hyperlipidemia on statin therapy followed by his PCP 

## 2015-09-10 NOTE — Patient Instructions (Signed)
Medication Instructions: Your physician recommends that you continue on your current medications as directed. Please refer to the Current Medication list given to you today.  Labwork: Your physician recommends that you return for lab work at your earliest [place].  Testing/Procedures: 1. Echocardiogram - Your physician has requested that you have an echocardiogram. Echocardiography is a painless test that uses sound waves to create images of your heart. It provides your doctor with information about the size and shape of your heart and how well your heart's chambers and valves are working. This procedure takes approximately one hour. There are no restrictions for this procedure.  2. Lexiscan Nuclear stress test - Your physician has requested that you have a lexiscan myoview. For further information please visit [email or web address]. Please follow instruction sheet, as given.  Follow-up: Dr Gwenlyn Found recommends that you schedule a follow-up appointment in 2-3 weeks.  If you need a refill on your cardiac medications before your next appointment, please call your pharmacy.

## 2015-09-10 NOTE — Assessment & Plan Note (Signed)
History of hypertension blood pressure measured 126/62. He is on amlodipine, hydrochlorothiazide , metoprolol, and Avapro.

## 2015-09-10 NOTE — Assessment & Plan Note (Signed)
History of CAD status post acute inferior wall myocardial infarction she with PCI stenting by myself 10/15/1995 He did have moderate LAD and circumflex disease as well at that time. He had another cath in 2011 treated with additional stenting. Over the last 3 months he's had progressive weakness and dyspnea on exertion. I'm going to get a 2-D echocardiogram and a pharmacologic  Myoview stress test to rule out an ischemic etiology.

## 2015-09-16 ENCOUNTER — Telehealth (HOSPITAL_COMMUNITY): Payer: Self-pay

## 2015-09-16 NOTE — Telephone Encounter (Signed)
Encounter complete. 

## 2015-09-21 ENCOUNTER — Ambulatory Visit (HOSPITAL_COMMUNITY)
Admission: RE | Admit: 2015-09-21 | Discharge: 2015-09-21 | Disposition: A | Discharge status: Home / Self Care | Payer: Medicare Other | Source: Ambulatory Visit | Attending: Cardiology | Admitting: Cardiology

## 2015-09-21 ENCOUNTER — Other Ambulatory Visit: Payer: Self-pay | Admitting: Cardiovascular Disease

## 2015-09-21 DIAGNOSIS — R42 Dizziness and giddiness: Secondary | ICD-10-CM | POA: Insufficient documentation

## 2015-09-21 DIAGNOSIS — Z87891 Personal history of nicotine dependence: Secondary | ICD-10-CM | POA: Insufficient documentation

## 2015-09-21 DIAGNOSIS — R079 Chest pain, unspecified: Secondary | ICD-10-CM | POA: Insufficient documentation

## 2015-09-21 DIAGNOSIS — R9439 Abnormal result of other cardiovascular function study: Secondary | ICD-10-CM | POA: Insufficient documentation

## 2015-09-21 DIAGNOSIS — I10 Essential (primary) hypertension: Secondary | ICD-10-CM | POA: Insufficient documentation

## 2015-09-21 DIAGNOSIS — R0609 Other forms of dyspnea: Secondary | ICD-10-CM | POA: Diagnosis not present

## 2015-09-21 DIAGNOSIS — R5383 Other fatigue: Secondary | ICD-10-CM | POA: Diagnosis not present

## 2015-09-21 DIAGNOSIS — I2510 Atherosclerotic heart disease of native coronary artery without angina pectoris: Secondary | ICD-10-CM | POA: Diagnosis not present

## 2015-09-21 DIAGNOSIS — E119 Type 2 diabetes mellitus without complications: Secondary | ICD-10-CM | POA: Insufficient documentation

## 2015-09-21 LAB — MYOCARDIAL PERFUSION IMAGING
CHL CUP NUCLEAR SRS: 9
CHL CUP NUCLEAR SSS: 15
LV dias vol: 198 mL (ref 62–150)
LV sys vol: 124 mL
Peak HR: 54 {beats}/min
Rest HR: 45 {beats}/min
SDS: 6
TID: 1.07

## 2015-09-21 MED ORDER — TECHNETIUM TC 99M TETROFOSMIN IV KIT
10.2000 | PACK | Freq: Once | INTRAVENOUS | Status: AC | PRN
  Administered 2015-09-21: 10.2 via INTRAVENOUS
  Filled 2015-09-21: qty 10

## 2015-09-21 MED ORDER — REGADENOSON 0.4 MG/5ML IV SOLN
0.4000 mg | Freq: Once | INTRAVENOUS | Status: AC
  Administered 2015-09-21: 0.4 mg via INTRAVENOUS

## 2015-09-21 MED ORDER — TECHNETIUM TC 99M TETROFOSMIN IV KIT
31.0000 | PACK | Freq: Once | INTRAVENOUS | Status: AC | PRN
  Administered 2015-09-21: 31 via INTRAVENOUS
  Filled 2015-09-21: qty 31

## 2015-09-21 NOTE — Telephone Encounter (Signed)
Refilled as below 90 capsule 2 07/29/2015

## 2015-09-25 ENCOUNTER — Encounter (HOSPITAL_COMMUNITY): Payer: Self-pay

## 2015-09-25 ENCOUNTER — Emergency Department (HOSPITAL_COMMUNITY): Payer: Medicare Other

## 2015-09-25 ENCOUNTER — Emergency Department (HOSPITAL_COMMUNITY)
Admission: EM | Admit: 2015-09-25 | Discharge: 2015-09-25 | Disposition: A | Discharge status: Home / Self Care | Payer: Medicare Other | Attending: Emergency Medicine | Admitting: Emergency Medicine

## 2015-09-25 DIAGNOSIS — Z7984 Long term (current) use of oral hypoglycemic drugs: Secondary | ICD-10-CM | POA: Diagnosis not present

## 2015-09-25 DIAGNOSIS — Y998 Other external cause status: Secondary | ICD-10-CM | POA: Diagnosis not present

## 2015-09-25 DIAGNOSIS — Z79899 Other long term (current) drug therapy: Secondary | ICD-10-CM | POA: Insufficient documentation

## 2015-09-25 DIAGNOSIS — I2510 Atherosclerotic heart disease of native coronary artery without angina pectoris: Secondary | ICD-10-CM | POA: Insufficient documentation

## 2015-09-25 DIAGNOSIS — E119 Type 2 diabetes mellitus without complications: Secondary | ICD-10-CM | POA: Diagnosis not present

## 2015-09-25 DIAGNOSIS — Z8701 Personal history of pneumonia (recurrent): Secondary | ICD-10-CM | POA: Diagnosis not present

## 2015-09-25 DIAGNOSIS — E785 Hyperlipidemia, unspecified: Secondary | ICD-10-CM | POA: Diagnosis not present

## 2015-09-25 DIAGNOSIS — M1990 Unspecified osteoarthritis, unspecified site: Secondary | ICD-10-CM | POA: Diagnosis not present

## 2015-09-25 DIAGNOSIS — Z862 Personal history of diseases of the blood and blood-forming organs and certain disorders involving the immune mechanism: Secondary | ICD-10-CM | POA: Diagnosis not present

## 2015-09-25 DIAGNOSIS — S60511A Abrasion of right hand, initial encounter: Secondary | ICD-10-CM | POA: Diagnosis not present

## 2015-09-25 DIAGNOSIS — Z85038 Personal history of other malignant neoplasm of large intestine: Secondary | ICD-10-CM | POA: Diagnosis not present

## 2015-09-25 DIAGNOSIS — Z87442 Personal history of urinary calculi: Secondary | ICD-10-CM | POA: Insufficient documentation

## 2015-09-25 DIAGNOSIS — Z8709 Personal history of other diseases of the respiratory system: Secondary | ICD-10-CM | POA: Diagnosis not present

## 2015-09-25 DIAGNOSIS — Y9389 Activity, other specified: Secondary | ICD-10-CM | POA: Insufficient documentation

## 2015-09-25 DIAGNOSIS — I10 Essential (primary) hypertension: Secondary | ICD-10-CM | POA: Insufficient documentation

## 2015-09-25 DIAGNOSIS — K219 Gastro-esophageal reflux disease without esophagitis: Secondary | ICD-10-CM | POA: Diagnosis not present

## 2015-09-25 DIAGNOSIS — Z7982 Long term (current) use of aspirin: Secondary | ICD-10-CM | POA: Diagnosis not present

## 2015-09-25 DIAGNOSIS — W208XXA Other cause of strike by thrown, projected or falling object, initial encounter: Secondary | ICD-10-CM | POA: Diagnosis not present

## 2015-09-25 DIAGNOSIS — S0003XA Contusion of scalp, initial encounter: Secondary | ICD-10-CM | POA: Insufficient documentation

## 2015-09-25 DIAGNOSIS — Z87891 Personal history of nicotine dependence: Secondary | ICD-10-CM | POA: Insufficient documentation

## 2015-09-25 DIAGNOSIS — W19XXXA Unspecified fall, initial encounter: Secondary | ICD-10-CM

## 2015-09-25 DIAGNOSIS — S4992XA Unspecified injury of left shoulder and upper arm, initial encounter: Secondary | ICD-10-CM | POA: Insufficient documentation

## 2015-09-25 DIAGNOSIS — Z9861 Coronary angioplasty status: Secondary | ICD-10-CM | POA: Diagnosis not present

## 2015-09-25 DIAGNOSIS — S0990XA Unspecified injury of head, initial encounter: Secondary | ICD-10-CM | POA: Diagnosis present

## 2015-09-25 DIAGNOSIS — Y9289 Other specified places as the place of occurrence of the external cause: Secondary | ICD-10-CM | POA: Diagnosis not present

## 2015-09-25 NOTE — ED Notes (Signed)
The pt was at a family reunion and he ran into a glass door he thought was open.  Loc for approx one minute.  Head pain small cut to the dorsal rt hand.  Most of his pain is in his lt shoulder and lt arm.  Alert oriented x4 skin warm and dry

## 2015-09-25 NOTE — ED Notes (Addendum)
Patient here with hematoma and abrasion to back of head after door came loose falling on top of him knocking him backwards onto concrete floor. Positive loc. Also skin tear to right hand and left shoulder pain. Alert and oriented, no nausea, no blurred vision. Complains of minor head pain. No neck pain,

## 2015-09-25 NOTE — ED Notes (Signed)
Pt in xray

## 2015-09-25 NOTE — ED Provider Notes (Signed)
CSN: [id number]     Arrival date & time 09/25/15  1848 History   First MD Initiated Contact with Patient 09/25/15 1900     Chief Complaint  Patient presents with  . Fall  . Head Injury     (Consider location/radiation/quality/duration/timing/severity/associated sxs/prior Treatment) HPI   82 year old male who presents after trying to move a heavy double door that subsequently fell and knocked him to the ground and he hit the back of his head. Had a loss of consciousness of approximately 1-2 minutes. On resumption of consciousness the patient was acting like himself. He had no jerking, bowel incontinence or postictal state. On evaluation here patient is only complaining of posterior head pain, abrasion on his right dorsal hand. Also has chronic left shoulder pain and seems to have worsened after this fall. He does take Plavix and aspirin at home. No injury elsewhere. No history of the same.  Past Medical History  Diagnosis Date  . Internal hemorrhoids without mention of complication   . Stricture and stenosis of esophagus   . Unspecified sinusitis (chronic)   . Allergic rhinitis   . Diverticulosis of colon (without mention of hemorrhage)   . Benign neoplasm of colon   . Calculus of bile duct without mention of cholecystitis or obstruction   . Esophageal reflux   . Peptic ulcer, unspecified site, unspecified as acute or chronic, without mention of hemorrhage, perforation, or obstruction   . Other and unspecified hyperlipidemia   . Coronary atherosclerosis of unspecified type of vessel, native or graft   . Kidney stones   . Hypertension   . Myocardial infarction ([place]) 09/1995  . Shortness of breath 03/06/11     "w/exertion"  . Pneumonia   . Diabetes mellitus     "pills"  . Arthritis   . Anemia   . Hemorrhoids   . Hyperlipidemia    Past Surgical History  Procedure Laterality Date  . Coronary angioplasty with stent placement  01/20/2011    "2"  . Coronary angioplasty with stent  placement  1997    '1"  . Colonoscopy  01/26/11    severe left diverticulosis, internal hemorrhoids  . Esophagogastroduodenoscopy  01/26/11    esophageal stricture, antral ulcers - H. pylori bx negative  . Doppler echocardiography  2012, 2006  . Stress myocardial perfusion  2011  . Vascular uktrasound     Family History  Problem Relation Age of Onset  . Colon cancer Neg Hx   . Diabetes Mother   . Heart disease Mother    Social History  Substance Use Topics  . Smoking status: Former Smoker -- 2.00 packs/day for 45 years    Types: Cigarettes  . Smokeless tobacco: Never Used     Comment: "quit smoking cigarettes ~ 1997"  . Alcohol Use: No    Review of Systems  Gastrointestinal: Negative for abdominal pain.  Musculoskeletal:       Left shoulder pain with ROM  Skin: Positive for wound.  All other systems reviewed and are negative.     Allergies  Review of patient's allergies indicates no known allergies.  Home Medications   Prior to Admission medications   Medication Sig Start Date End Date Taking? Authorizing Provider  amLODipine (NORVASC) 5 MG tablet TAKE 1 TABLET BY MOUTH EVERY DAY AFTER SUPPER. 07/21/14   Lorretta Harp, MD  aspirin 81 MG chewable tablet Chew 81 mg by mouth at bedtime.      Historical Provider, MD  clopidogrel (PLAVIX) 75 MG  tablet TAKE 1 TABLET (75 MG TOTAL) BY MOUTH DAILY. 05/05/15   Lorretta Harp, MD  glimepiride (AMARYL) 2 MG tablet Take 4 mg by mouth daily.  04/03/13   Historical Provider, MD  hydrochlorothiazide (MICROZIDE) 12.5 MG capsule TAKE ONE CAPSULE BY MOUTH EVERY DAY 07/29/15   Lorretta Harp, MD  irbesartan (AVAPRO) 300 MG tablet TAKE 1 TABLET (300 MG TOTAL) BY MOUTH AT BEDTIME. 08/30/15   Lorretta Harp, MD  isosorbide mononitrate (IMDUR) 30 MG 24 hr tablet TAKE 1/2 TABLET BY MOUTH EVERY DAY *KEEP APPT* 04/28/15   Lorretta Harp, MD  metFORMIN (GLUCOPHAGE) 500 MG tablet Take 1,000 mg by mouth 2 (two) times daily with a meal.      Historical Provider, MD  metoprolol (LOPRESSOR) 50 MG tablet TAKE 1 TABLET (50 MG TOTAL) BY MOUTH 2 (TWO) TIMES DAILY. 06/14/15   Lorretta Harp, MD  omega-3 acid ethyl esters (LOVAZA) 1 G capsule Take 1 g by mouth 2 (two) times daily.     Historical Provider, MD  oxyCODONE-acetaminophen (PERCOCET) 5-325 MG per tablet Take 1-2 tablets by mouth every 4 (four) hours as needed. 09/16/14   Nat Christen, MD  pantoprazole (PROTONIX) 40 MG tablet TAKE 1 TABLET BY MOUTH TWICE DAILY 03/15/15   Irene Shipper, MD  polysaccharide iron (NIFEREX) 150 MG CAPS capsule Take 150 mg by mouth 2 (two) times daily.      Historical Provider, MD  pravastatin (PRAVACHOL) 40 MG tablet  09/06/13   Historical Provider, MD  timolol (TIMOPTIC) 0.5 % ophthalmic solution  02/12/14   Historical Provider, MD   BP 149/74 mmHg  Pulse 68  Temp(Src) 98.1 F (36.7 C) (Oral)  Resp 20  SpO2 94% Physical Exam  Constitutional: He is oriented to person, place, and time. He appears well-developed and well-nourished.  HENT:  Head: Normocephalic and atraumatic.  Marland Kitchen Hematoma to the posterior scalp without underlying tenderness but no obvious step-offs or deformities.  Neck: Normal range of motion.  Cardiovascular: Normal rate.   Pulmonary/Chest: Effort normal. No respiratory distress.  Abdominal: He exhibits no distension.  Musculoskeletal: Normal range of motion.  Neurological: He is alert and oriented to person, place, and time. No cranial nerve deficit. Coordination normal.  No altered mental status, able to give full seemingly accurate history.  Face is symmetric, EOM's intact, pupils equal and reactive, vision intact, tongue and uvula midline without deviation Upper and Lower extremity motor 5/5, intact pain perception in distal extremities, 2+ reflexes in biceps, patella and achilles tendons. Finger to nose normal, heel to shin normal. Walks without assistance or evident ataxia.  Skin: Skin is warm and dry. No erythema.  Dorsal hand  lac, hemostatic, well approximated, clean no underlying tenderness  Nursing note and vitals reviewed.   ED Course  Procedures (including critical care time) Labs Review Labs Reviewed - No data to display  Imaging Review Ct Head Wo Contrast  09/25/2015  CLINICAL DATA:  Pain after trauma EXAM: CT HEAD WITHOUT CONTRAST CT CERVICAL SPINE WITHOUT CONTRAST TECHNIQUE: Multidetector CT imaging of the head and cervical spine was performed following the standard protocol without intravenous contrast. Multiplanar CT image reconstructions of the cervical spine were also generated. COMPARISON:  None. FINDINGS: CT HEAD FINDINGS No acute abnormalities associated with the paranasal sinuses. The mastoid air cells and middle ears are well aerated. No acute fractures. Soft tissue swelling is seen over the vertex, centered just to the left consistent with the reported history. No underlying fracture  is identified. Remainder of the extracranial soft tissues are normal. No subdural, epidural, or subarachnoid hemorrhage. No mass, mass effect, or midline shift. Cerebellum, brainstem, and basal cisterns are normal. Mild scattered white matter changes with no acute cortical ischemia. CT CERVICAL SPINE FINDINGS There is 2 mm of retrolisthesis of the C3 versus C4. No other malalignment. No fractures are seen. Multilevel degenerative changes with small anterior and posterior osteophytes. Multilevel neural foraminal narrowing bilaterally such as at C4-5 and C5-6. No critical canal narrowing identified. Multilevel disc bulges seen as well resulting in canal narrowing at some levels including at C6-7 where the canal measures 9 mm in AP dimension. IMPRESSION: 1. Hematoma at the left vertex. 2. No acute intracranial abnormality. 3. Degenerative changes in the cervical spine with no fracture. Minimal retrolisthesis of C3 versus C4 is likely degenerative. Electronically Signed   By: Dorise Bullion III M.D   On: 09/25/2015 20:53   Ct  Cervical Spine Wo Contrast  09/25/2015  CLINICAL DATA:  Pain after trauma EXAM: CT HEAD WITHOUT CONTRAST CT CERVICAL SPINE WITHOUT CONTRAST TECHNIQUE: Multidetector CT imaging of the head and cervical spine was performed following the standard protocol without intravenous contrast. Multiplanar CT image reconstructions of the cervical spine were also generated. COMPARISON:  None. FINDINGS: CT HEAD FINDINGS No acute abnormalities associated with the paranasal sinuses. The mastoid air cells and middle ears are well aerated. No acute fractures. Soft tissue swelling is seen over the vertex, centered just to the left consistent with the reported history. No underlying fracture is identified. Remainder of the extracranial soft tissues are normal. No subdural, epidural, or subarachnoid hemorrhage. No mass, mass effect, or midline shift. Cerebellum, brainstem, and basal cisterns are normal. Mild scattered white matter changes with no acute cortical ischemia. CT CERVICAL SPINE FINDINGS There is 2 mm of retrolisthesis of the C3 versus C4. No other malalignment. No fractures are seen. Multilevel degenerative changes with small anterior and posterior osteophytes. Multilevel neural foraminal narrowing bilaterally such as at C4-5 and C5-6. No critical canal narrowing identified. Multilevel disc bulges seen as well resulting in canal narrowing at some levels including at C6-7 where the canal measures 9 mm in AP dimension. IMPRESSION: 1. Hematoma at the left vertex. 2. No acute intracranial abnormality. 3. Degenerative changes in the cervical spine with no fracture. Minimal retrolisthesis of C3 versus C4 is likely degenerative. Electronically Signed   By: Dorise Bullion III M.D   On: 09/25/2015 20:53   Dg Shoulder Left  09/25/2015  CLINICAL DATA:  Door fell on shoulder EXAM: LEFT SHOULDER - 2+ VIEW COMPARISON:  None. FINDINGS: Frontal and Y scapular images were obtained. There is no apparent fracture or dislocation. The joint  spaces appear unremarkable. There is superior migration of the left humeral head. No erosive change. The visualized left lung is clear. There is calcification in the left carotid artery. IMPRESSION: Superior migration of the left humeral head, a finding indicative of chronic rotator cuff tear. No acute fracture or dislocation. There is calcification in the left carotid artery. Joint spaces appear overall unremarkable. Electronically Signed   By: Lowella Grip III M.D.   On: 09/25/2015 20:56   I have personally reviewed and evaluated these images and lab results as part of my medical decision-making.   EKG Interpretation None      MDM   Final diagnoses:  Fall, initial encounter    82 year old male on Plavix and aspirin here after a fall and loss of consciousness. No nausea or  vomiting no step-offs or deformities. CT scans negative for any acute injuries. Has a chronic rotator cuff injury causing left humeral subluxation but no other abnormalities. Patient able amulet without much difficulty. Discussed the possibility of occult bleed with him and his family and return here for any signs or symptoms of that. Otherwise patient stable for discharge.   New Prescriptions: Discharge Medication List as of 09/25/2015  9:53 PM      I have personally and contemperaneously reviewed labs and imaging and used in my decision making as above.   A medical screening exam was performed and I feel the patient has had an appropriate workup for their chief complaint at this time and likelihood of emergent condition existing is low and thus workup can continue on an outpatient basis.. Their vital signs are stable. They have been counseled on decision, discharge, follow up and which symptoms necessitate immediate return to the emergency department.  They verbally stated understanding and agreement with plan and discharged in stable condition.      Merrily Pew, MD 09/25/15 2330

## 2015-09-27 ENCOUNTER — Other Ambulatory Visit (HOSPITAL_COMMUNITY): Payer: Medicare Other

## 2015-09-28 ENCOUNTER — Ambulatory Visit: Payer: Medicare Other | Admitting: Cardiovascular Disease

## 2015-10-09 ENCOUNTER — Other Ambulatory Visit: Payer: Self-pay | Admitting: Internal Medicine

## 2015-10-14 ENCOUNTER — Ambulatory Visit: Payer: Medicare Other | Admitting: Cardiovascular Disease

## 2015-10-15 ENCOUNTER — Ambulatory Visit (HOSPITAL_COMMUNITY): Payer: Medicare Other | Attending: Cardiovascular Disease

## 2015-10-15 ENCOUNTER — Encounter: Payer: Self-pay | Admitting: Cardiovascular Disease

## 2015-10-15 ENCOUNTER — Other Ambulatory Visit: Payer: Self-pay

## 2015-10-15 DIAGNOSIS — E785 Hyperlipidemia, unspecified: Secondary | ICD-10-CM | POA: Diagnosis not present

## 2015-10-15 DIAGNOSIS — I350 Nonrheumatic aortic (valve) stenosis: Secondary | ICD-10-CM | POA: Diagnosis not present

## 2015-10-15 DIAGNOSIS — R011 Cardiac murmur, unspecified: Secondary | ICD-10-CM | POA: Diagnosis present

## 2015-10-15 DIAGNOSIS — Z87891 Personal history of nicotine dependence: Secondary | ICD-10-CM | POA: Diagnosis not present

## 2015-10-15 DIAGNOSIS — D649 Anemia, unspecified: Secondary | ICD-10-CM | POA: Insufficient documentation

## 2015-10-15 DIAGNOSIS — R0602 Shortness of breath: Secondary | ICD-10-CM | POA: Insufficient documentation

## 2015-10-15 DIAGNOSIS — I119 Hypertensive heart disease without heart failure: Secondary | ICD-10-CM | POA: Diagnosis not present

## 2015-10-15 LAB — ECHOCARDIOGRAM COMPLETE
AO mean calculated velocity dopler: 180 cm/s
AV Area mean vel: 1.51 cm2
AV area mean vel ind: 0.73 cm2/m2
AV peak Index: 0.74
AV vel: 1.62
AVA: 1.62 cm2
AVAREAVTI: 1.55 cm2
AVAREAVTIIND: 0.78 cm2/m2
AVCELMEANRAT: 0.4
AVG: 15 mmHg
AVPG: 28 mmHg
AVPKVEL: 264 cm/s
Ao pk vel: 0.41 m/s
Ao-asc: 35 cm
CHL CUP AV VALUE AREA INDEX: 0.78
CHL CUP DOP CALC LVOT VTI: 27.1 cm
CHL CUP MV DEC (S): 248
EERAT: 8.45
EWDT: 248 ms
FS: 26 % — AB (ref 28–44)
IV/PV OW: 0.98
LA ID, A-P, ES: 48 mm
LA diam index: 2.3 cm/m2
LA vol A4C: 75.2 ml
LA vol index: 39.8 mL/m2
LAVOL: 83 mL
LDCA: 3.8 cm2
LEFT ATRIUM END SYS DIAM: 48 mm
LV E/e' medial: 8.45
LV E/e'average: 8.45
LV PW d: 12.5 mm — AB (ref 0.6–1.1)
LV TDI E'MEDIAL: 4.73
LVELAT: 9.3 cm/s
LVOT SV: 103 mL
LVOT diameter: 22 mm
LVOT peak VTI: 0.43 cm
LVOTPV: 108 cm/s
MV pk A vel: 98.5 m/s
MVPG: 2 mmHg
MVPKEVEL: 78.6 m/s
RV LATERAL S' VELOCITY: 6.96 cm/s
RV TAPSE: 20.9 mm
RV sys press: 29 mmHg
Reg peak vel: 230 cm/s
TDI e' lateral: 9.3
TRMAXVEL: 230 cm/s
VTI: 63.4 cm

## 2015-10-19 ENCOUNTER — Ambulatory Visit (INDEPENDENT_AMBULATORY_CARE_PROVIDER_SITE_OTHER): Payer: Medicare Other | Admitting: Cardiovascular Disease

## 2015-10-19 ENCOUNTER — Encounter: Payer: Self-pay | Admitting: Cardiovascular Disease

## 2015-10-19 VITALS — BP 132/76 | HR 60 | Ht 69.0 in | Wt 189.2 lb

## 2015-10-19 DIAGNOSIS — I2510 Atherosclerotic heart disease of native coronary artery without angina pectoris: Secondary | ICD-10-CM

## 2015-10-19 DIAGNOSIS — R0609 Other forms of dyspnea: Secondary | ICD-10-CM

## 2015-10-19 NOTE — Patient Instructions (Signed)

## 2015-10-19 NOTE — Progress Notes (Signed)
10/19/2015 Kevin Rhodes   22-Mar-1934  [id number]  Primary Physician Kevin Rough, PA-C Primary Cardiologist: Kevin Harp MD Kevin Rhodes  HPI:  The patient is a delightful 82 year old, thin-appearing, widowed (wife passed away 3 years ago) Caucasian male, father of 3, grandfather to 7 grandchildren who I saw 09/10/15. He has a history of CAD status post inferior wall myocardial infarction, PCI and stenting by myself on October 15, 1995. He had moderate LAD and circumflex disease as well as ramus disease at that time. Functional studies performed November 2011 showed inferior scar without ischemia, unchanged from prior studies. His other problems include hypertension, hyperlipidemia and remote tobacco abuse, having quit back in 1997. He was complaining of exertional dyspnea with pain going to his jaws and arms prompting heart cath reveal LAD and RCA disease which I stented in a staged fashion using 9 drug-eluting stents. His symptoms resolved. He became anemic and had a GI bleed and underwent colonoscopy by Dr. Henrene Rhodes revealing diverticulosis, esophageal strictures and ulcers, as well as an antral ulcer. PPIs were doubled. I did begin him on some Lexapro when I saw him last for situational depression which he did not tolerate, though he apparently is getting better over time.  Since I saw him in the office one month ago. He has had a 2-D echo and a Myoview stress test performed because of recent complaints of dyspnea. The echo revealed normal LV function with mild aortic stenosis and the Myoview showed inferolateral scar unchanged from his prior stress test performed 02/23/10. He does feel somewhat quickly improved since I last saw him.   Current Outpatient Prescriptions  Medication Sig Dispense Refill  . amLODipine (NORVASC) 5 MG tablet TAKE 1 TABLET BY MOUTH EVERY DAY AFTER SUPPER. 30 tablet 8  . aspirin 81 MG chewable tablet Chew 81 mg by mouth at bedtime.      . clopidogrel  (PLAVIX) 75 MG tablet TAKE 1 TABLET (75 MG TOTAL) BY MOUTH DAILY. 30 tablet 9  . glimepiride (AMARYL) 2 MG tablet Take 4 mg by mouth daily.     . hydrochlorothiazide (MICROZIDE) 12.5 MG capsule TAKE ONE CAPSULE BY MOUTH EVERY DAY 90 capsule 2  . irbesartan (AVAPRO) 300 MG tablet TAKE 1 TABLET (300 MG TOTAL) BY MOUTH AT BEDTIME. 30 tablet 8  . isosorbide mononitrate (IMDUR) 30 MG 24 hr tablet TAKE 1/2 TABLET BY MOUTH EVERY DAY *KEEP APPT* 15 tablet 10  . metFORMIN (GLUCOPHAGE) 500 MG tablet Take 1,000 mg by mouth 2 (two) times daily with a meal.     . metoprolol (LOPRESSOR) 50 MG tablet TAKE 1 TABLET (50 MG TOTAL) BY MOUTH 2 (TWO) TIMES DAILY. 60 tablet 9  . omega-3 acid ethyl esters (LOVAZA) 1 G capsule Take 1 g by mouth 2 (two) times daily.     Marland Kitchen oxyCODONE-acetaminophen (PERCOCET) 5-325 MG per tablet Take 1-2 tablets by mouth every 4 (four) hours as needed. 20 tablet 0  . pantoprazole (PROTONIX) 40 MG tablet TAKE 1 TABLET BY MOUTH TWICE DAILY 60 tablet 3  . polysaccharide iron (NIFEREX) 150 MG CAPS capsule Take 150 mg by mouth 2 (two) times daily.      . pravastatin (PRAVACHOL) 40 MG tablet     . timolol (TIMOPTIC) 0.5 % ophthalmic solution      No current facility-administered medications for this visit.    Allergies  Allergen Reactions  . Nsaids Other (See Comments)    Avoid due to h/o peptic  ulcers    Social History   Social History  . Marital Status: Married    Spouse Name: N/A  . Number of Children: 3  . Years of Education: N/A   Occupational History  . retired    Social History Main Topics  . Smoking status: Former Smoker -- 2.00 packs/day for 45 years    Types: Cigarettes  . Smokeless tobacco: Never Used     Comment: "quit smoking cigarettes ~ 1997"  . Alcohol Use: No  . Drug Use: No  . Sexual Activity: Not on file   Other Topics Concern  . Not on file   Social History Narrative     Review of Systems: General: negative for chills, fever, night sweats or  weight changes.  Cardiovascular: negative for chest pain, dyspnea on exertion, edema, orthopnea, palpitations, paroxysmal nocturnal dyspnea or shortness of breath Dermatological: negative for rash Respiratory: negative for cough or wheezing Urologic: negative for hematuria Abdominal: negative for nausea, vomiting, diarrhea, bright red blood per rectum, melena, or hematemesis Neurologic: negative for visual changes, syncope, or dizziness All other systems reviewed and are otherwise negative except as noted above.    Blood pressure 132/76, pulse 60, height 5\' 9"  (1.753 m), weight 189 lb 4 oz (85.843 kg).  General appearance: alert and no distress Neck: no adenopathy, no carotid bruit, no JVD, supple, symmetrical, trachea midline and thyroid not enlarged, symmetric, no tenderness/mass/nodules Lungs: clear to auscultation bilaterally Heart: Soft outflow tract murmur Extremities: extremities normal, atraumatic, no cyanosis or edema  EKG not performed today  ASSESSMENT AND PLAN:   Dyspnea on exertion I saw [name] a month ago for weakness and dyspnea on exertion. His 2-D echo revealed a valve area of 1.62 cm2 with a peak gradient of 28 mmHg consistent with mild aortic stenosis. His Myoview stress test showed inferolateral scar unchanged from his last study performed 02/23/10. He does feel somewhat better clinically significant since I last saw him. At this point, we'll continue to follow him as an outpatient. He'll see mid-level provider back in 6 months and me back in one year.      Kevin Harp MD FACP,FACC,FAHA, [facility] 10/19/2015 12:26 PM

## 2015-10-19 NOTE — Assessment & Plan Note (Signed)
I saw [name] a month ago for weakness and dyspnea on exertion. His 2-D echo revealed a valve area of 1.62 cm2 with a peak gradient of 28 mmHg consistent with mild aortic stenosis. His Myoview stress test showed inferolateral scar unchanged from his last study performed 02/23/10. He does feel somewhat better clinically significant since I last saw him. At this point, we'll continue to follow him as an outpatient. He'll see mid-level provider back in 6 months and me back in one year.

## 2015-10-25 NOTE — Telephone Encounter (Signed)
Advised that I am unable to refill pantoprazole until he is seen in office. He has not been seen in over 3 years (last appt 05/2012). He verbalizes understanding.

## 2015-12-15 ENCOUNTER — Encounter: Payer: Self-pay | Admitting: Internal Medicine

## 2015-12-15 ENCOUNTER — Telehealth: Payer: Self-pay

## 2015-12-15 ENCOUNTER — Ambulatory Visit (INDEPENDENT_AMBULATORY_CARE_PROVIDER_SITE_OTHER): Payer: Medicare Other | Admitting: Internal Medicine

## 2015-12-15 VITALS — BP 138/80 | HR 80 | Ht 65.25 in | Wt 187.0 lb

## 2015-12-15 DIAGNOSIS — R1084 Generalized abdominal pain: Secondary | ICD-10-CM

## 2015-12-15 DIAGNOSIS — I2510 Atherosclerotic heart disease of native coronary artery without angina pectoris: Secondary | ICD-10-CM | POA: Diagnosis not present

## 2015-12-15 DIAGNOSIS — K4090 Unilateral inguinal hernia, without obstruction or gangrene, not specified as recurrent: Secondary | ICD-10-CM | POA: Diagnosis not present

## 2015-12-15 DIAGNOSIS — K7469 Other cirrhosis of liver: Secondary | ICD-10-CM

## 2015-12-15 NOTE — Patient Instructions (Signed)
You have been scheduled for an abdominal ultrasound at [facility] (1st floor of hospital) on 12/17/2015 at 8:30am. Please arrive 15 minutes prior to your appointment for registration. Make certain not to have anything to eat or drink  After midnight prior to your appointment. Should you need to reschedule your appointment, please contact radiology at [phone]. This test typically takes about 30 minutes to perform.  You have been scheduled for an endoscopy. Please follow written instructions given to you at your visit today. If you use inhalers (even only as needed), please bring them with you on the day of your procedure.  Continue taking your Protonix twice a day

## 2015-12-15 NOTE — Telephone Encounter (Signed)
error 

## 2015-12-15 NOTE — Progress Notes (Signed)
HISTORY OF PRESENT ILLNESS:  Kevin Rhodes is a 82 y.o. male with MULTIPLE SIGNIFICANT medical problems as listed below. He presents today with chief complaint of abdominal pain. He is accompanied by his daughter. He was last evaluated in this office February 2014 regarding midabdominal discomfort. Upper endoscopy and CT scan of the abdomen and pelvis recommended. Patient did not follow through. He states that he has continued with abdominal discomfort since that time. He describes most often awakening at 4 AM with below the bellybutton discomfort described as an ache. They last anywhere from 2:50 hours. During the day, symptoms can be exacerbated with meals. He has had 7 pound weight loss and 3 and half years. He does have daily bowel movements. No nausea or vomiting. No bleeding. He does have a large left inguinal hernia but states this does not bother him. Colonoscopy and upper endoscopy last performed October 2012. Colonoscopy was unremarkable. Upper endoscopy revealed shallow ulcers which were treated with PPI and followed up to healing that same year. He states that his current discomfort is somewhat reminiscent of ulcers. However, he does report compliance with twice daily pantoprazole 40 mg. He is on chronic Plavix therapy. Also takes diabetic medications. He does request a refill of his PPI therapy.  REVIEW OF SYSTEMS:  All non-GI ROS negative except for arthritis, back pain, fatigue, muscle cramps, sleeping problems, ankle swelling, increased thirst, urinary leakage, shortness of breath  Past Medical History:  Diagnosis Date  . Allergic rhinitis   . Anemia   . Arthritis   . Benign neoplasm of colon   . Calculus of bile duct without mention of cholecystitis or obstruction   . Coronary atherosclerosis of unspecified type of vessel, native or graft   . Diabetes mellitus    "pills"  . Diverticulosis of colon (without mention of hemorrhage)   . Esophageal reflux   . Hemorrhoids   .  Hyperlipidemia   . Hypertension   . Internal hemorrhoids without mention of complication   . Kidney stones   . Myocardial infarction ([place]) 09/1995  . Other and unspecified hyperlipidemia   . Peptic ulcer, unspecified site, unspecified as acute or chronic, without mention of hemorrhage, perforation, or obstruction   . Pneumonia   . Shortness of breath 03/06/11    "w/exertion"  . Stricture and stenosis of esophagus   . Unspecified sinusitis (chronic)     Past Surgical History:  Procedure Laterality Date  . COLONOSCOPY  01/26/11   severe left diverticulosis, internal hemorrhoids  . CORONARY ANGIOPLASTY WITH STENT PLACEMENT  01/20/2011   "2"  . [place]   '1"  . DOPPLER ECHOCARDIOGRAPHY  2012, 2006  . ESOPHAGOGASTRODUODENOSCOPY  01/26/11   esophageal stricture, antral ulcers - H. pylori bx negative  . stress myocardial perfusion  2011  . vascular uktrasound      Social History Rashawd Hildebrandt Nestor  reports that he has quit smoking. His smoking use included Cigarettes. He has a 90.00 pack-year smoking history. He has never used smokeless tobacco. He reports that he does not drink alcohol or use drugs.  family history includes Diabetes in his mother; Heart disease in his mother.  Allergies  Allergen Reactions  . Nsaids Other (See Comments)    Avoid due to h/o peptic ulcers       PHYSICAL EXAMINATION: Vital signs: BP 138/80   Pulse 80   Ht 5' 5.25" (1.657 m) Comment: measured without shoes  Wt 187 lb (84.8 kg)  BMI 30.88 kg/m   Constitutional: Pleasant elderly male in no acute distress, chronically ill-appearing Psychiatric: alert and oriented x3, cooperative Eyes: extraocular movements intact, anicteric, conjunctiva pink Mouth: oral pharynx moist, no lesions. No thrush Neck: supple without thyromegaly Lymph no lymphadenopathy Cardiovascular: heart regular rate and rhythm, no murmur Lungs: clear to auscultation bilaterally Abdomen:  soft, nontender, nondistended, no obvious ascites, no peritoneal signs, normal bowel sounds, no organomegaly Groin. Large left inguinal hernia Rectal: Omitted Extremities: no clubbing or cyanosis. Trace lower extremity edema bilaterally Skin: no lesions on visible extremities Neuro: No focal deficits. Cranial nerves intact  ASSESSMENT:  #1. Elderly male with chronic mid lower abdominal discomfort as described. Does have a history of peptic ulcer disease and contracted gallbladder. Rule out ulcer disease despite PPI. Rule out gallbladder disease. Rule out symptomatic inguinal hernia. Rule out spasm #2. Multiple significant medical problems including diabetes and chronic Plavix therapy for coronary artery disease and prior stent #3. Hepatic cirrhosis with portal hypertension on previous CT 2013   PLAN:  #1. Continue PPI #2. Refill PPI #3. Set up upper endoscopy to evaluate pain. The patient is high risk.The nature of the procedure, as well as the risks, benefits, and alternatives were carefully and thoroughly reviewed with the patient. Ample time for discussion and questions allowed. The patient understood, was satisfied, and agreed to proceed. #4. Continue Plavix for the exam #5. Adjust diabetic medications as instructed for the exam #6. Schedule abdominal ultrasound to further evaluate pain #7. Consider antispasmodics such as Levsin sublingual if workup negative to see if this helps pain #8. Surgical evaluation should he have any symptoms related to his hernia. He understands. His daughter understands.

## 2015-12-17 ENCOUNTER — Ambulatory Visit (HOSPITAL_COMMUNITY)
Admission: RE | Admit: 2015-12-17 | Discharge: 2015-12-17 | Disposition: A | Discharge status: Home / Self Care | Payer: Medicare Other | Source: Ambulatory Visit | Attending: Internal Medicine | Admitting: Internal Medicine

## 2015-12-17 DIAGNOSIS — R935 Abnormal findings on diagnostic imaging of other abdominal regions, including retroperitoneum: Secondary | ICD-10-CM | POA: Diagnosis not present

## 2015-12-17 DIAGNOSIS — D734 Cyst of spleen: Secondary | ICD-10-CM | POA: Insufficient documentation

## 2015-12-17 DIAGNOSIS — R140 Abdominal distension (gaseous): Secondary | ICD-10-CM | POA: Diagnosis not present

## 2015-12-17 DIAGNOSIS — N281 Cyst of kidney, acquired: Secondary | ICD-10-CM | POA: Diagnosis not present

## 2015-12-17 DIAGNOSIS — R1084 Generalized abdominal pain: Secondary | ICD-10-CM

## 2015-12-21 ENCOUNTER — Ambulatory Visit (AMBULATORY_SURGERY_CENTER): Payer: Medicare Other | Admitting: Internal Medicine

## 2015-12-21 ENCOUNTER — Encounter: Payer: Self-pay | Admitting: Internal Medicine

## 2015-12-21 VITALS — BP 126/57 | HR 48 | Temp 99.1°F | Resp 16 | Ht 65.25 in | Wt 187.0 lb

## 2015-12-21 DIAGNOSIS — K2100 Gastro-esophageal reflux disease with esophagitis, without bleeding: Secondary | ICD-10-CM

## 2015-12-21 DIAGNOSIS — R1084 Generalized abdominal pain: Secondary | ICD-10-CM

## 2015-12-21 LAB — GLUCOSE, CAPILLARY
GLUCOSE-CAPILLARY: 207 mg/dL — AB (ref 65–99)
Glucose-Capillary: 176 mg/dL — ABNORMAL HIGH (ref 65–99)

## 2015-12-21 MED ORDER — PANTOPRAZOLE SODIUM 40 MG PO TBEC: 40.0000 mg | DELAYED_RELEASE_TABLET | Freq: Two times a day (BID) | ORAL | 11 refills | Status: AC

## 2015-12-21 MED ORDER — SODIUM CHLORIDE 0.9 % IV SOLN
500.0000 mL | INTRAVENOUS | Status: DC
Start: 2015-12-21 — End: 2016-03-03

## 2015-12-21 NOTE — Op Note (Signed)
[facility] Patient Name: Kevin Rhodes Procedure Date: 12/21/2015 8:00 AM MRN: [id number] Endoscopist: Docia Chuck. Henrene Pastor , MD Age: 82 Referring MD:  Date of Birth: 19-Dec-1933 Gender: Male Account #: [id number] Procedure:                Upper GI endoscopy Indications:              Abdominal pain Medicines:                Monitored Anesthesia Care Procedure:                Pre-Anesthesia Assessment:                           - Prior to the procedure, a History and Physical                            was performed, and patient medications and                            allergies were reviewed. The patient's tolerance of                            previous anesthesia was also reviewed. The risks                            and benefits of the procedure and the sedation                            options and risks were discussed with the patient.                            All questions were answered, and informed consent                            was obtained. Prior Anticoagulants: The patient has                            taken Plavix (clopidogrel), last dose was day of                            procedure. ASA Grade Assessment: III - A patient                            with severe systemic disease. After reviewing the                            risks and benefits, the patient was deemed in                            satisfactory condition to undergo the procedure.                           After obtaining informed consent, the endoscope was  passed under direct vision. Throughout the                            procedure, the patient's blood pressure, pulse, and                            oxygen saturations were monitored continuously. The                            Model GIF-[id number] [id number]) scope was introduced                            through the mouth, and advanced to the second part                            of duodenum. The upper GI endoscopy  was                            accomplished without difficulty. The patient                            tolerated the procedure well. Scope In: Scope Out: Findings:                 LA Grade C (one or more mucosal breaks continuous                            between tops of 2 or more mucosal folds, less than                            75% circumference) esophagitis was found. He was                            edema and large caliber stricture formation.                           The stomach was normal.                           The examined duodenum was normal.                           The cardia and gastric fundus were normal on                            retroflexion. Complications:            No immediate complications. Estimated Blood Loss:     Estimated blood loss: none. Impression:               - GERD with erosive esophagitis                           - Otherwise normal EGD. No active ulcer disease. Recommendation:           - Take pantoprazole 40 mg twice daily. 30 minutes  before breakfast and 30 minutes before evening meal.                           - Reflux precautions.                           - Continue present medications. Docia Chuck. Henrene Pastor, MD 12/21/2015 8:46:39 AM This report has been signed electronically.

## 2015-12-21 NOTE — Progress Notes (Signed)
Report to PACU, RN, vss, BBS= Clear.  

## 2015-12-21 NOTE — Patient Instructions (Signed)
YOU HAD AN ENDOSCOPIC PROCEDURE TODAY AT [facility]:   Refer to the procedure report that was given to you for any specific questions about what was found during the examination.  If the procedure report does not answer your questions, please call your gastroenterologist to clarify.  If you requested that your care partner not be given the details of your procedure findings, then the procedure report has been included in a sealed envelope for you to review at your convenience later.  YOU SHOULD EXPECT: Some feelings of bloating in the abdomen. Passage of more gas than usual.  Walking can help get rid of the air that was put into your GI tract during the procedure and reduce the bloating. If you had a lower endoscopy (such as a colonoscopy or flexible sigmoidoscopy) you may notice spotting of blood in your stool or on the toilet paper. If you underwent a bowel prep for your procedure, you may not have a normal bowel movement for a few days.  Please Note:  You might notice some irritation and congestion in your nose or some drainage.  This is from the oxygen used during your procedure.  There is no need for concern and it should clear up in a day or so.  SYMPTOMS TO REPORT IMMEDIATELY:   Following lower endoscopy (colonoscopy or flexible sigmoidoscopy):  Excessive amounts of blood in the stool  Significant tenderness or worsening of abdominal pains  Swelling of the abdomen that is new, acute  Fever of 100F or higher   Following upper endoscopy (EGD)  Vomiting of blood or coffee ground material  New chest pain or pain under the shoulder blades  Painful or persistently difficult swallowing  New shortness of breath  Fever of 100F or higher  Black, tarry-looking stools  For urgent or emergent issues, a gastroenterologist can be reached at any hour by calling [phone].   DIET:  We do recommend a small meal at first, but then you may proceed to your regular diet.  Drink  plenty of fluids but you should avoid alcoholic beverages for 24 hours.  ACTIVITY:  You should plan to take it easy for the rest of today and you should NOT DRIVE or use heavy machinery until tomorrow (because of the sedation medicines used during the test).    FOLLOW UP: Our staff will call the number listed on your records the next business day following your procedure to check on you and address any questions or concerns that you may have regarding the information given to you following your procedure. If we do not reach you, we will leave a message.  However, if you are feeling well and you are not experiencing any problems, there is no need to return our call.  We will assume that you have returned to your regular daily activities without incident.  If any biopsies were taken you will be contacted by phone or by letter within the next 1-3 weeks.  Please call us at [phone] if you have not heard about the biopsies in 3 weeks.    SIGNATURES/CONFIDENTIALITY: You and/or your care partner have signed paperwork which will be entered into your electronic medical record.  These signatures attest to the fact that that the information above on your After Visit Summary has been reviewed and is understood.  Full responsibility of the confidentiality of this discharge information lies with you and/or your care-partner.  GERD and esophagitis information given.  Pantoprazole 40 mg. 30 minutes  before breakfast and 30 minutes before evening meal.

## 2015-12-22 ENCOUNTER — Telehealth: Payer: Self-pay | Admitting: *Deleted

## 2015-12-22 NOTE — Telephone Encounter (Signed)
  Follow up Call-  Call back number 12/21/2015  Post procedure Call Back phone  # [phone]  Permission to leave phone message Yes  Some recent data might be hidden     Patient questions:  Do you have a fever, pain , or abdominal swelling? No. Pain Score  0 *  Have you tolerated food without any problems? Yes.    Have you been able to return to your normal activities? Yes.    Do you have any questions about your discharge instructions: Diet   No. Medications  No. Follow up visit  No.  Do you have questions or concerns about your Care? No.  Actions: * If pain score is 4 or above: No action needed, pain <4.

## 2016-01-03 ENCOUNTER — Other Ambulatory Visit: Payer: Self-pay | Admitting: Cardiovascular Disease

## 2016-01-03 NOTE — Telephone Encounter (Signed)
REFILL 

## 2016-02-03 ENCOUNTER — Telehealth: Payer: Self-pay | Admitting: Internal Medicine

## 2016-02-03 NOTE — Telephone Encounter (Signed)
Let Col[facility] [place] that pt has not had a CT done. Per last OV note pt was to have [place], this was done and ok. Pt was to keep EGD appt and he did keep this appt.

## 2016-03-01 ENCOUNTER — Emergency Department (HOSPITAL_COMMUNITY): Payer: Medicare Other

## 2016-03-01 ENCOUNTER — Inpatient Hospital Stay (HOSPITAL_COMMUNITY)
Admission: EM | Admit: 2016-03-01 | Discharge: 2016-03-03 | DRG: 246 | Disposition: A | Discharge status: Home / Self Care | Payer: Medicare Other | Attending: Interventional Cardiology | Admitting: Interventional Cardiology

## 2016-03-01 ENCOUNTER — Encounter (HOSPITAL_COMMUNITY): Payer: Self-pay

## 2016-03-01 DIAGNOSIS — Z87891 Personal history of nicotine dependence: Secondary | ICD-10-CM

## 2016-03-01 DIAGNOSIS — I2583 Coronary atherosclerosis due to lipid rich plaque: Secondary | ICD-10-CM | POA: Diagnosis present

## 2016-03-01 DIAGNOSIS — D638 Anemia in other chronic diseases classified elsewhere: Secondary | ICD-10-CM | POA: Diagnosis present

## 2016-03-01 DIAGNOSIS — E1129 Type 2 diabetes mellitus with other diabetic kidney complication: Secondary | ICD-10-CM | POA: Insufficient documentation

## 2016-03-01 DIAGNOSIS — I25110 Atherosclerotic heart disease of native coronary artery with unstable angina pectoris: Secondary | ICD-10-CM | POA: Diagnosis present

## 2016-03-01 DIAGNOSIS — D500 Iron deficiency anemia secondary to blood loss (chronic): Secondary | ICD-10-CM | POA: Diagnosis present

## 2016-03-01 DIAGNOSIS — Z8249 Family history of ischemic heart disease and other diseases of the circulatory system: Secondary | ICD-10-CM | POA: Diagnosis not present

## 2016-03-01 DIAGNOSIS — E1122 Type 2 diabetes mellitus with diabetic chronic kidney disease: Secondary | ICD-10-CM | POA: Diagnosis present

## 2016-03-01 DIAGNOSIS — I129 Hypertensive chronic kidney disease with stage 1 through stage 4 chronic kidney disease, or unspecified chronic kidney disease: Secondary | ICD-10-CM | POA: Diagnosis present

## 2016-03-01 DIAGNOSIS — I214 Non-ST elevation (NSTEMI) myocardial infarction: Secondary | ICD-10-CM | POA: Diagnosis present

## 2016-03-01 DIAGNOSIS — Z7984 Long term (current) use of oral hypoglycemic drugs: Secondary | ICD-10-CM

## 2016-03-01 DIAGNOSIS — K7460 Unspecified cirrhosis of liver: Secondary | ICD-10-CM | POA: Diagnosis present

## 2016-03-01 DIAGNOSIS — Z833 Family history of diabetes mellitus: Secondary | ICD-10-CM | POA: Diagnosis not present

## 2016-03-01 DIAGNOSIS — I252 Old myocardial infarction: Secondary | ICD-10-CM | POA: Diagnosis not present

## 2016-03-01 DIAGNOSIS — E785 Hyperlipidemia, unspecified: Secondary | ICD-10-CM | POA: Diagnosis present

## 2016-03-01 DIAGNOSIS — Z7982 Long term (current) use of aspirin: Secondary | ICD-10-CM | POA: Diagnosis not present

## 2016-03-01 DIAGNOSIS — Z9861 Coronary angioplasty status: Secondary | ICD-10-CM

## 2016-03-01 DIAGNOSIS — Z006 Encounter for examination for normal comparison and control in clinical research program: Secondary | ICD-10-CM | POA: Diagnosis not present

## 2016-03-01 DIAGNOSIS — I2510 Atherosclerotic heart disease of native coronary artery without angina pectoris: Secondary | ICD-10-CM | POA: Diagnosis not present

## 2016-03-01 DIAGNOSIS — I249 Acute ischemic heart disease, unspecified: Secondary | ICD-10-CM | POA: Diagnosis present

## 2016-03-01 DIAGNOSIS — Z87442 Personal history of urinary calculi: Secondary | ICD-10-CM | POA: Diagnosis not present

## 2016-03-01 DIAGNOSIS — R17 Unspecified jaundice: Secondary | ICD-10-CM | POA: Diagnosis not present

## 2016-03-01 DIAGNOSIS — E1165 Type 2 diabetes mellitus with hyperglycemia: Secondary | ICD-10-CM | POA: Diagnosis present

## 2016-03-01 DIAGNOSIS — N183 Chronic kidney disease, stage 3 unspecified: Secondary | ICD-10-CM

## 2016-03-01 DIAGNOSIS — I10 Essential (primary) hypertension: Secondary | ICD-10-CM | POA: Diagnosis present

## 2016-03-01 DIAGNOSIS — E1100 Type 2 diabetes mellitus with hyperosmolarity without nonketotic hyperglycemic-hyperosmolar coma (NKHHC): Secondary | ICD-10-CM

## 2016-03-01 DIAGNOSIS — Z7902 Long term (current) use of antithrombotics/antiplatelets: Secondary | ICD-10-CM

## 2016-03-01 DIAGNOSIS — Z955 Presence of coronary angioplasty implant and graft: Secondary | ICD-10-CM | POA: Diagnosis not present

## 2016-03-01 DIAGNOSIS — T82855A Stenosis of coronary artery stent, initial encounter: Principal | ICD-10-CM | POA: Diagnosis present

## 2016-03-01 DIAGNOSIS — Z888 Allergy status to other drugs, medicaments and biological substances status: Secondary | ICD-10-CM | POA: Diagnosis not present

## 2016-03-01 DIAGNOSIS — K222 Esophageal obstruction: Secondary | ICD-10-CM

## 2016-03-01 HISTORY — DX: Non-ST elevation (NSTEMI) myocardial infarction: I21.4

## 2016-03-01 HISTORY — DX: Atherosclerotic heart disease of native coronary artery without angina pectoris: I25.10

## 2016-03-01 LAB — GLUCOSE, CAPILLARY: GLUCOSE-CAPILLARY: 185 mg/dL — AB (ref 65–99)

## 2016-03-01 LAB — TROPONIN I
TROPONIN I: 0.06 ng/mL — AB (ref <0.03)
TROPONIN I: 0.79 ng/mL — AB (ref <0.03)
TROPONIN I: 1.07 ng/mL — AB (ref <0.03)

## 2016-03-01 LAB — CBC
HEMATOCRIT: 30.4 % — AB (ref 39.0–52.0)
HEMOGLOBIN: 9.4 g/dL — AB (ref 13.0–17.0)
MCH: 25.4 pg — ABNORMAL LOW (ref 26.0–34.0)
MCHC: 30.9 g/dL (ref 30.0–36.0)
MCV: 82.2 fL (ref 78.0–100.0)
Platelets: 201 10*3/uL (ref 150–400)
RBC: 3.7 MIL/uL — ABNORMAL LOW (ref 4.22–5.81)
RDW: 16 % — AB (ref 11.5–15.5)
WBC: 5 10*3/uL (ref 4.0–10.5)

## 2016-03-01 LAB — URINALYSIS, ROUTINE W REFLEX MICROSCOPIC
BILIRUBIN URINE: NEGATIVE
GLUCOSE, UA: 500 mg/dL — AB
Hgb urine dipstick: NEGATIVE
KETONES UR: NEGATIVE mg/dL
LEUKOCYTES UA: NEGATIVE
NITRITE: NEGATIVE
PH: 5.5 (ref 5.0–8.0)
PROTEIN: NEGATIVE mg/dL
Specific Gravity, Urine: 1.013 (ref 1.005–1.030)

## 2016-03-01 LAB — BASIC METABOLIC PANEL
ANION GAP: 8 (ref 5–15)
BUN: 27 mg/dL — AB (ref 6–20)
CHLORIDE: 111 mmol/L (ref 101–111)
CO2: 22 mmol/L (ref 22–32)
Calcium: 9 mg/dL (ref 8.9–10.3)
Creatinine, Ser: 1.5 mg/dL — ABNORMAL HIGH (ref 0.61–1.24)
GFR calc Af Amer: 49 mL/min — ABNORMAL LOW (ref >60)
GFR, EST NON AFRICAN AMERICAN: 42 mL/min — AB (ref >60)
GLUCOSE: 264 mg/dL — AB (ref 65–99)
POTASSIUM: 3.9 mmol/L (ref 3.5–5.1)
Sodium: 141 mmol/L (ref 135–145)

## 2016-03-01 LAB — BRAIN NATRIURETIC PEPTIDE: B NATRIURETIC PEPTIDE 5: 88.1 pg/mL (ref 0.0–100.0)

## 2016-03-01 LAB — I-STAT TROPONIN, ED: Troponin i, poc: 0.06 ng/mL (ref 0.00–0.08)

## 2016-03-01 MED ORDER — OMEGA-3-ACID ETHYL ESTERS 1 G PO CAPS
1.0000 g | ORAL_CAPSULE | Freq: Two times a day (BID) | ORAL | Status: DC
  Administered 2016-03-02 (×2): 1 g via ORAL
  Filled 2016-03-01 (×4): qty 1

## 2016-03-01 MED ORDER — CLOPIDOGREL BISULFATE 75 MG PO TABS
75.0000 mg | ORAL_TABLET | Freq: Every day | ORAL | Status: DC
  Administered 2016-03-02: 75 mg via ORAL
  Filled 2016-03-01: qty 1

## 2016-03-01 MED ORDER — METOPROLOL TARTRATE 25 MG PO TABS
50.0000 mg | ORAL_TABLET | Freq: Two times a day (BID) | ORAL | Status: DC
  Administered 2016-03-01 – 2016-03-03 (×3): 50 mg via ORAL
  Filled 2016-03-01: qty 2
  Filled 2016-03-01: qty 1
  Filled 2016-03-01: qty 2
  Filled 2016-03-01: qty 1

## 2016-03-01 MED ORDER — PRAVASTATIN SODIUM 40 MG PO TABS
20.0000 mg | ORAL_TABLET | Freq: Every day | ORAL | Status: DC
  Administered 2016-03-01 – 2016-03-02 (×2): 20 mg via ORAL
  Filled 2016-03-01 (×2): qty 1

## 2016-03-01 MED ORDER — SODIUM CHLORIDE 0.9 % WEIGHT BASED INFUSION
3.0000 mL/kg/h | INTRAVENOUS | Status: DC
  Administered 2016-03-02: 3 mL/kg/h via INTRAVENOUS

## 2016-03-01 MED ORDER — ONDANSETRON HCL 4 MG/2ML IJ SOLN: 4.0000 mg | Freq: Four times a day (QID) | INTRAMUSCULAR | Status: DC | PRN

## 2016-03-01 MED ORDER — TIMOLOL MALEATE 0.5 % OP SOLN: 1.0000 [drp] | Freq: Every day | OPHTHALMIC | Status: DC

## 2016-03-01 MED ORDER — ACETAMINOPHEN 325 MG PO TABS: 650.0000 mg | ORAL_TABLET | ORAL | Status: DC | PRN

## 2016-03-01 MED ORDER — HEPARIN SODIUM (PORCINE) 5000 UNIT/ML IJ SOLN: 4000.0000 [IU] | Freq: Once | INTRAMUSCULAR | Status: DC

## 2016-03-01 MED ORDER — ISOSORBIDE MONONITRATE ER 30 MG PO TB24
15.0000 mg | ORAL_TABLET | Freq: Every day | ORAL | Status: DC
  Administered 2016-03-02 – 2016-03-03 (×2): 15 mg via ORAL
  Filled 2016-03-01 (×2): qty 1

## 2016-03-01 MED ORDER — POLYSACCHARIDE IRON COMPLEX 150 MG PO CAPS
150.0000 mg | ORAL_CAPSULE | Freq: Two times a day (BID) | ORAL | Status: DC
  Administered 2016-03-02 – 2016-03-03 (×2): 150 mg via ORAL
  Filled 2016-03-01 (×3): qty 1

## 2016-03-01 MED ORDER — SODIUM CHLORIDE 0.9% FLUSH: 3.0000 mL | INTRAVENOUS | Status: DC | PRN

## 2016-03-01 MED ORDER — HEPARIN BOLUS VIA INFUSION
4000.0000 [IU] | Freq: Once | INTRAVENOUS | Status: AC
  Administered 2016-03-01: 4000 [IU] via INTRAVENOUS
  Filled 2016-03-01: qty 4000

## 2016-03-01 MED ORDER — NITROGLYCERIN 0.4 MG SL SUBL
0.4000 mg | SUBLINGUAL_TABLET | SUBLINGUAL | Status: DC | PRN
Start: 2016-03-01 — End: 2016-03-03

## 2016-03-01 MED ORDER — SODIUM CHLORIDE 0.9% FLUSH: 3.0000 mL | Freq: Two times a day (BID) | INTRAVENOUS | Status: DC

## 2016-03-01 MED ORDER — PANTOPRAZOLE SODIUM 40 MG PO TBEC
40.0000 mg | DELAYED_RELEASE_TABLET | Freq: Two times a day (BID) | ORAL | Status: DC
  Administered 2016-03-02 – 2016-03-03 (×3): 40 mg via ORAL
  Filled 2016-03-01 (×4): qty 1

## 2016-03-01 MED ORDER — HEPARIN (PORCINE) IN NACL 100-0.45 UNIT/ML-% IJ SOLN: 15.0000 [IU]/kg/h | Freq: Once | INTRAMUSCULAR | Status: DC

## 2016-03-01 MED ORDER — ASPIRIN 81 MG PO CHEW
81.0000 mg | CHEWABLE_TABLET | Freq: Every day | ORAL | Status: DC
  Administered 2016-03-01 – 2016-03-02 (×2): 81 mg via ORAL
  Filled 2016-03-01 (×2): qty 1

## 2016-03-01 MED ORDER — INSULIN ASPART 100 UNIT/ML ~~LOC~~ SOLN
0.0000 [IU] | Freq: Three times a day (TID) | SUBCUTANEOUS | Status: DC
  Administered 2016-03-03: 07:00:00 3 [IU] via SUBCUTANEOUS

## 2016-03-01 MED ORDER — TRAMADOL HCL 50 MG PO TABS
50.0000 mg | ORAL_TABLET | Freq: Three times a day (TID) | ORAL | Status: DC | PRN
  Administered 2016-03-02 (×2): 50 mg via ORAL
  Filled 2016-03-01 (×2): qty 1

## 2016-03-01 MED ORDER — FAMOTIDINE IN NACL 20-0.9 MG/50ML-% IV SOLN
20.0000 mg | Freq: Once | INTRAVENOUS | Status: AC
  Administered 2016-03-01: 20 mg via INTRAVENOUS
  Filled 2016-03-01: qty 50

## 2016-03-01 MED ORDER — SODIUM CHLORIDE 0.9% FLUSH
3.0000 mL | Freq: Two times a day (BID) | INTRAVENOUS | Status: DC
  Administered 2016-03-01 – 2016-03-02 (×2): 3 mL via INTRAVENOUS

## 2016-03-01 MED ORDER — SODIUM CHLORIDE 0.9 % IV SOLN: 250.0000 mL | INTRAVENOUS | Status: DC | PRN

## 2016-03-01 MED ORDER — SODIUM CHLORIDE 0.9 % WEIGHT BASED INFUSION: 1.0000 mL/kg/h | INTRAVENOUS | Status: DC

## 2016-03-01 MED ORDER — HEPARIN (PORCINE) IN NACL 100-0.45 UNIT/ML-% IJ SOLN
900.0000 [IU]/h | INTRAMUSCULAR | Status: DC
  Administered 2016-03-01 – 2016-03-02 (×2): 900 [IU]/h via INTRAVENOUS
  Filled 2016-03-01: qty 250

## 2016-03-01 MED ORDER — GLIMEPIRIDE 4 MG PO TABS
4.0000 mg | ORAL_TABLET | Freq: Every day | ORAL | Status: DC
  Administered 2016-03-03: 4 mg via ORAL
  Filled 2016-03-01: qty 1

## 2016-03-01 MED ORDER — AMLODIPINE BESYLATE 5 MG PO TABS
5.0000 mg | ORAL_TABLET | Freq: Every day | ORAL | Status: DC
  Administered 2016-03-02 – 2016-03-03 (×2): 5 mg via ORAL
  Filled 2016-03-01 (×2): qty 1

## 2016-03-01 MED ORDER — TRAZODONE HCL 50 MG PO TABS
25.0000 mg | ORAL_TABLET | Freq: Once | ORAL | Status: AC
  Administered 2016-03-01: 25 mg via ORAL
  Filled 2016-03-01: qty 1

## 2016-03-01 NOTE — ED Triage Notes (Signed)
BIB EMS Reports "i just got sick and it all the sudden hit me, sweating weak, chest pain jaw pain". States "thought I was dying". 4 ASA and 1 nitro pain now 2/10.

## 2016-03-01 NOTE — ED Provider Notes (Signed)
[facility] DEPT Provider Note   CSN: [id number] Arrival date & time: 03/01/16  1533     History   Chief Complaint Chief Complaint  Patient presents with  . Chest Pain    HPI Kevin Rhodes is a 82 y.o. male.  Patient presents with sudden onset of substernal chest pressure that started at around 2:30 when he had gotten up to walk into the kitchen. Was associated with feeling diaphoretic and impending doom, pressure radiated to his left arm and jaw. Symptoms resolved after resting on the couch and he called EMS. Was given 324 mg of ASA and nitro, though reports his symptoms had mostly resolved before the nitro. Has a history of multiple stents, last cath was in [address]. Stress tested several months ago. Sees Dr. Gwenlyn Found for follow up. Missed a dose of his meds yesterday but otherwise has been compliant. No history of fevers, cough, or shortness of breath.   The history is provided by the patient and the EMS personnel. No language interpreter was used.  Chest Pain   This is a new problem. The current episode started 1 to 2 hours ago. The problem occurs constantly. The problem has been resolved. The pain is associated with exertion. The pain is present in the substernal region. The pain is at a severity of 10/10. The pain is severe. The quality of the pain is described as pressure-like. The pain radiates to the left jaw, left arm and left shoulder. Duration of episode(s) is 30 minutes. The symptoms are aggravated by exertion. Associated symptoms include diaphoresis and shortness of breath. Pertinent negatives include no abdominal pain, no back pain, no cough, no fever, no lower extremity edema, no malaise/fatigue and no nausea. He has tried nitroglycerin and rest for the symptoms. The treatment provided moderate relief. Risk factors include being elderly and male gender.  His past medical history is significant for CAD and MI.  Procedure history is positive for cardiac catheterization and  stress echo.    Past Medical History:  Diagnosis Date  . Allergic rhinitis   . Anemia   . Arthritis   . Benign neoplasm of colon   . Blood transfusion without reported diagnosis   . Calculus of bile duct without mention of cholecystitis or obstruction   . Coronary atherosclerosis of unspecified type of vessel, native or graft   . Depression    after wife passed away  . Diabetes mellitus    "pills"  . Diverticulosis of colon (without mention of hemorrhage)   . Esophageal reflux   . Hemorrhoids   . Hyperlipidemia   . Hypertension   . Internal hemorrhoids without mention of complication   . Kidney stones   . Myocardial infarction 09/1995  . Osteoporosis   . Other and unspecified hyperlipidemia   . Peptic ulcer, unspecified site, unspecified as acute or chronic, without mention of hemorrhage, perforation, or obstruction   . Pneumonia   . Shortness of breath 03/06/11    "w/exertion"  . Stricture and stenosis of esophagus   . Unspecified sinusitis (chronic)     Patient Active Problem List   Diagnosis Date Noted  . Non-ST elevation MI (NSTEMI) ([place]) 03/01/2016  . Diabetes mellitus type II, uncontrolled ([place]) 03/01/2016  . Chronic kidney disease, stage III (moderate) 03/01/2016  . Acute coronary syndrome ([place]) 03/01/2016  . Hyperlipidemia 03/24/2014  . Essential hypertension 04/07/2013  . Deficiency anemia 09/07/2011  . Right low back pain 07/06/2011  . Cirrhosis ([place]) 06/30/2011  . Inguinal hernia, bilateral  06/30/2011  . Anemia due to blood loss, chronic 03/06/2011  . COLONIC POLYPS 07/12/2007  . Coronary atherosclerosis 07/12/2007  . HEMORRHOIDS, INTERNAL 07/12/2007  . SINUSITIS 07/12/2007  . EROSIVE ESOPHAGITIS 07/12/2007  . ESOPHAGEAL STRICTURE 07/12/2007  . GERD 07/12/2007  . PEPTIC ULCER DISEASE 07/12/2007  . DIVERTICULOSIS, COLON 07/12/2007  . CHOLEDOCHOLITHIASIS 07/12/2007    Past Surgical History:  Procedure Laterality Date  . CATARACT EXTRACTION      both eyes   . COLONOSCOPY  01/26/11   severe left diverticulosis, internal hemorrhoids  . CORONARY ANGIOPLASTY WITH STENT PLACEMENT  01/20/2011   "2"  . [place]   '1"  . DOPPLER ECHOCARDIOGRAPHY  2012, 2006  . ESOPHAGOGASTRODUODENOSCOPY  01/26/11   esophageal stricture, antral ulcers - H. pylori bx negative  . stress myocardial perfusion  2011  . UPPER GASTROINTESTINAL ENDOSCOPY    . vascular uktrasound         Home Medications    Prior to Admission medications   Medication Sig Start Date End Date Taking? Authorizing Provider  amLODipine (NORVASC) 5 MG tablet TAKE 1 TABLET (5 MG TOTAL) BY MOUTH DAILY. 01/03/16   Lorretta Harp, MD  aspirin 81 MG chewable tablet Chew 81 mg by mouth at bedtime.      Historical Provider, MD  clopidogrel (PLAVIX) 75 MG tablet TAKE 1 TABLET (75 MG TOTAL) BY MOUTH DAILY. 05/05/15   Lorretta Harp, MD  glimepiride (AMARYL) 2 MG tablet Take 4 mg by mouth daily.  04/03/13   Historical Provider, MD  hydrochlorothiazide (MICROZIDE) 12.5 MG capsule TAKE ONE CAPSULE BY MOUTH EVERY DAY 07/29/15   Lorretta Harp, MD  irbesartan (AVAPRO) 300 MG tablet TAKE 1 TABLET (300 MG TOTAL) BY MOUTH AT BEDTIME. 08/30/15   Lorretta Harp, MD  isosorbide mononitrate (IMDUR) 30 MG 24 hr tablet TAKE 1/2 TABLET BY MOUTH EVERY DAY *KEEP APPT* 04/28/15   Lorretta Harp, MD  metFORMIN (GLUCOPHAGE) 500 MG tablet Take 1,000 mg by mouth 2 (two) times daily with a meal.     Historical Provider, MD  metoprolol (LOPRESSOR) 50 MG tablet TAKE 1 TABLET (50 MG TOTAL) BY MOUTH 2 (TWO) TIMES DAILY. 06/14/15   Lorretta Harp, MD  omega-3 acid ethyl esters (LOVAZA) 1 G capsule Take 1 g by mouth 2 (two) times daily.     Historical Provider, MD  oxyCODONE-acetaminophen (PERCOCET) 5-325 MG per tablet Take 1-2 tablets by mouth every 4 (four) hours as needed. 09/16/14   Nat Christen, MD  pantoprazole (PROTONIX) 40 MG tablet Take 1 tablet (40 mg total) by mouth 2  (two) times daily. Take 40mg .   twice daily.  30 minutes before breakfast and 30 minutes before evening meal. 12/21/15   Irene Shipper, MD  polysaccharide iron (NIFEREX) 150 MG CAPS capsule Take 150 mg by mouth 2 (two) times daily.      Historical Provider, MD  pravastatin (PRAVACHOL) 40 MG tablet Take 1 mg by mouth at bedtime.  09/06/13   Historical Provider, MD  timolol (TIMOPTIC) 0.5 % ophthalmic solution Place 1 drop into both eyes daily.  02/12/14   Historical Provider, MD  traMADol (ULTRAM) 50 MG tablet Take 50 mg by mouth every 8 (eight) hours as needed (for pain).  10/06/15   Historical Provider, MD    Family History Family History  Problem Relation Age of Onset  . Diabetes Mother   . Heart disease Mother   . Colon cancer Neg  Hx   . Esophageal cancer Neg Hx   . Rectal cancer Neg Hx   . Stomach cancer Neg Hx     Social History Social History  Substance Use Topics  . Smoking status: Former Smoker    Packs/day: 2.00    Years: 45.00    Types: Cigarettes  . Smokeless tobacco: Never Used     Comment: "quit smoking cigarettes ~ 1997"  . Alcohol use No     Allergies   Nsaids and Ambien [zolpidem]   Review of Systems Review of Systems  Constitutional: Positive for diaphoresis. Negative for fever and malaise/fatigue.  HENT: Negative.   Respiratory: Positive for shortness of breath. Negative for cough.   Cardiovascular: Positive for chest pain.  Gastrointestinal: Negative for abdominal pain and nausea.  Genitourinary: Negative.   Musculoskeletal: Negative for back pain.  Skin: Negative.   Neurological: Negative.   Hematological: Does not bruise/bleed easily.  Psychiatric/Behavioral: Negative.      Physical Exam Updated Vital Signs BP 148/81   Pulse 90   Temp 97.7 F (36.5 C) (Oral)   Resp 22   Ht 5\' 8"  (1.727 m)   Wt 74.8 kg   SpO2 98%   BMI 25.09 kg/m   Physical Exam  Constitutional: He is oriented to person, place, and time. He appears well-developed and  well-nourished. No distress.  Eyes: Conjunctivae and EOM are normal. No scleral icterus.  Neck: Normal range of motion. Neck supple.  Cardiovascular: Normal rate, regular rhythm, normal heart sounds and intact distal pulses.  Exam reveals no gallop and no friction rub.   No murmur heard. Pulmonary/Chest: Effort normal and breath sounds normal. No respiratory distress. He has no wheezes. He has no rales. He exhibits no tenderness.  Abdominal: Soft. He exhibits no distension. There is tenderness in the epigastric area. There is no rebound and no guarding.  Neurological: He is alert and oriented to person, place, and time.  Skin: Skin is warm and dry. He is not diaphoretic.  Psychiatric: He has a normal mood and affect. His behavior is normal. Judgment and thought content normal.     ED Treatments / Results  Labs (all labs ordered are listed, but only abnormal results are displayed) Labs Reviewed  BASIC METABOLIC PANEL - Abnormal; Notable for the following:       Result Value   Glucose, Bld 264 (*)    BUN 27 (*)    Creatinine, Ser 1.50 (*)    GFR calc non Af Amer 42 (*)    GFR calc Af Amer 49 (*)    All other components within normal limits  CBC - Abnormal; Notable for the following:    RBC 3.70 (*)    Hemoglobin 9.4 (*)    HCT 30.4 (*)    MCH 25.4 (*)    RDW 16.0 (*)    All other components within normal limits  TROPONIN I - Abnormal; Notable for the following:    Troponin I 0.06 (*)    All other components within normal limits  HEPARIN LEVEL (UNFRACTIONATED)  CBC  TROPONIN I  COMPREHENSIVE METABOLIC PANEL  BRAIN NATRIURETIC PEPTIDE  TROPONIN I  TROPONIN I  TROPONIN I  URINALYSIS, ROUTINE W REFLEX MICROSCOPIC (NOT AT [facility])  Randolm Idol, ED    EKG  EKG Interpretation None       Radiology Dg Chest 2 View  Result Date: 03/01/2016 CLINICAL DATA:  Left arm and chest pain. EXAM: CHEST  2 VIEW COMPARISON:  03/06/2011 FINDINGS:  Mild elevation of the right  hemidiaphragm is stable. Mild enlargement of the cardiac silhouette is stable. The trachea is midline. The lungs are clear without airspace disease or pulmonary edema. No pleural effusions. IMPRESSION: No acute cardiopulmonary disease. Stable mild cardiomegaly. Electronically Signed   By: Markus Daft M.D.   On: 03/01/2016 16:08    Procedures Procedures (including critical care time)  Medications Ordered in ED Medications  heparin ADULT infusion 100 units/mL (25000 units/263mL sodium chloride 0.45%) (900 Units/hr Intravenous New Bag/Given 03/01/16 1657)  famotidine (PEPCID) IVPB 20 mg premix (0 mg Intravenous Stopped 03/01/16 1658)  heparin bolus via infusion 4,000 Units (4,000 Units Intravenous Bolus from Bag 03/01/16 1657)     Initial Impression / Assessment and Plan / ED Course  I have reviewed the triage vital signs and the nursing notes.  Pertinent labs & imaging results that were available during my care of the patient were reviewed by me and considered in my medical decision making (see chart for details).  Clinical Course     Patient presents with concerning story for typical chest pain, history of multiple stents. He is followed by Dr. Gwenlyn Found here. He is chest pain free on arrival and overall well-appearing. EKG reveals multiple ST abnormalities but these appear similar to prior EKGs (this was reviewed with the interventional cardiologist on call, Dr. Claiborne Billings). Initial troponin slightly elevated. He has received aspirin and was put on a heparin drip with concern for NSTEMI. Do not suspect dissection based on history of pain. Low concern for PE given no respiratory symptoms, normal oxygen saturation. Chest x-ray without signs of pneumonia, pneumothorax, or interstitial edema. Cardiology was consulted and he will be admitted for further management.   Final Clinical Impressions(s) / ED Diagnoses   Final diagnoses:  NSTEMI (non-ST elevated myocardial infarction) [facility])    New  Prescriptions New Prescriptions   No medications on file     Harlin Heys, MD 03/01/16 2019    Tanna Furry, MD 03/21/16 1537

## 2016-03-01 NOTE — ED Notes (Signed)
Attempted to call report

## 2016-03-01 NOTE — H&P (Addendum)
Kevin Rhodes is a 82 y.o. male  Admit Date: 03/01/2016 Referring Physician: [name], M.D.; [facility] [place] Primary Cardiologist:: Quay Burow, M.D. Chief complaint / reason for admission:  Prolonged chest discomfort with positive cardiac markers  HPI: 82 year old gentleman with history of coronary artery disease with history of coronary artery disease status post inferior wall MI 1997 (stent), RCA stent (DES 2012) and LAD (DES 2012 presumed, but unable to find the report). The patient has progressive dyspnea on exertion over the past several months. Today he experienced chest pressure radiating into his neck associated with fatigue, diaphoresis, and dyspnea. The entire episode lasted approximately 45 minutes before gradually resolving. He did not attempt to use nitroglycerin. The discomfort was concerning because it felt similar to prior episodes of angina.  September 2012 LAD/RCA PCI was complicated by GI bleed.  Decreased appetite, dark urine, dysphagia, and weight loss have been noted.    PMH:    Past Medical History:  Diagnosis Date  . Allergic rhinitis   . Anemia   . Arthritis   . Benign neoplasm of colon   . Blood transfusion without reported diagnosis   . Calculus of bile duct without mention of cholecystitis or obstruction   . Coronary atherosclerosis of unspecified type of vessel, native or graft   . Depression    after wife passed away  . Diabetes mellitus    "pills"  . Diverticulosis of colon (without mention of hemorrhage)   . Esophageal reflux   . Hemorrhoids   . Hyperlipidemia   . Hypertension   . Internal hemorrhoids without mention of complication   . Kidney stones   . Myocardial infarction 09/1995  . Osteoporosis   . Other and unspecified hyperlipidemia   . Peptic ulcer, unspecified site, unspecified as acute or chronic, without mention of hemorrhage, perforation, or obstruction   . Pneumonia   . Shortness of breath 03/06/11    "w/exertion"  . Stricture and stenosis of esophagus   . Unspecified sinusitis (chronic)     PSH:    Past Surgical History:  Procedure Laterality Date  . CATARACT EXTRACTION     both eyes   . COLONOSCOPY  01/26/11   severe left diverticulosis, internal hemorrhoids  . CORONARY ANGIOPLASTY WITH STENT PLACEMENT  01/20/2011   "2"  . [place]   '1"  . DOPPLER ECHOCARDIOGRAPHY  2012, 2006  . ESOPHAGOGASTRODUODENOSCOPY  01/26/11   esophageal stricture, antral ulcers - H. pylori bx negative  . stress myocardial perfusion  2011  . UPPER GASTROINTESTINAL ENDOSCOPY    . vascular uktrasound     ALLERGIES:   Nsaids and Ambien [zolpidem] Prior to Admit Meds:   (Not in a hospital admission) Family HX:    Family History  Problem Relation Age of Onset  . Diabetes Mother   . Heart disease Mother   . Colon cancer Neg Hx   . Esophageal cancer Neg Hx   . Rectal cancer Neg Hx   . Stomach cancer Neg Hx    Social HX:    Social History   Social History  . Marital status: Married    Spouse name: N/A  . Number of children: 3  . Years of education: N/A   Occupational History  . retired    Social History Main Topics  . Smoking status: Former Smoker    Packs/day: 2.00    Years: 45.00    Types: Cigarettes  . Smokeless tobacco: Never  Used     Comment: "quit smoking cigarettes ~ 1997"  . Alcohol use No  . Drug use: No  . Sexual activity: Not on file   Other Topics Concern  . Not on file   Social History Narrative  . No narrative on file     ROS Dysphagia to solids. Has history of esophageal stricture. Exertional fatigue. No episodes of syncope. Denies orthopnea. Has exertional dyspnea. Also has a history of diverticulosis, esophageal stricture, and peptic ulcer disease. All other systems are negative.  Physical Exam: Blood pressure 130/83, pulse 76, temperature 97.7 F (36.5 C), temperature source Oral, resp. rate 12, height 5\' 8"  (1.727 m),  weight 165 lb (74.8 kg), SpO2 100 %.    Elderly gentleman in no acute distress. Skin is clear and dry. HEENT exam reveals scleral icterus. Neck exam reveals bilateral carotid transmitted bruits are heard. No JVD is noted. Chest exam basal rales. No wheezing. Abdomen is soft. Bowel sounds normal. Liver and spleen are not palpable. Extremities reveal no edema. Radial pulses are 2+. Carotids are 2+. Posterior tibial pulses are 2+ and symmetric. Neurological exam reveals a patient who is alert and oriented. No focal motor or sensory deficits noted.   Labs: Lab Results  Component Value Date   WBC 5.0 03/01/2016   HGB 9.4 (L) 03/01/2016   HCT 30.4 (L) 03/01/2016   MCV 82.2 03/01/2016   PLT 201 03/01/2016    Recent Labs Lab 03/01/16 1627  NA 141  K 3.9  CL 111  CO2 22  BUN 27*  CREATININE 1.50*  CALCIUM 9.0  GLUCOSE 264*   Lab Results  Component Value Date   CKTOTAL 174 03/06/2011   CKMB 3.9 03/06/2011   TROPONINI 0.06 ([place]) 03/01/2016     Radiology:  Dg Chest 2 View  Result Date: 03/01/2016 CLINICAL DATA:  Left arm and chest pain. EXAM: CHEST  2 VIEW COMPARISON:  03/06/2011 FINDINGS: Mild elevation of the right hemidiaphragm is stable. Mild enlargement of the cardiac silhouette is stable. The trachea is midline. The lungs are clear without airspace disease or pulmonary edema. No pleural effusions. IMPRESSION: No acute cardiopulmonary disease. Stable mild cardiomegaly. Electronically Signed   By: Markus Daft M.D.   On: 03/01/2016 16:08    EKG:  Normal sinus rhythm, old inferior infarction, interventricular conduction delay, poor R-wave progression. Interventricular conduction delay has a left bundle branch block appearance.   ASSESSMENT:  1. ACS / ?Non-ST elevation myocardial infarction.  2. History of coronary artery disease with prior RCA and LAD stents most recently 2012.  3. History of anemia  4. Chronic kidney disease stage III  5. Jaundice of uncertain etiology  6.  Hypertension 7. Diabetes mellitus, type II, with unknown level of control    Plan:  1. IV heparin. Follow hemoglobin closely  2. Check urine for bilirubin  3. Cycle cardiac markers to rule out myocardial infarction  4. IV nitroglycerin if her current chest discomfort  5. Continue usual home medications.  6. Sliding-scale insulin as needed for diabetes control  7. Admitted to telemetry, 3 W.  8. Probable coronary angiography tomorrow. We'll keep the patient nothing by mouth. We'll hydrate the patient starting early in the a.m. after the laboratory data is available.  9. Nothing by mouth after midnight  Belva Crome III 03/01/2016 6:38 PM

## 2016-03-01 NOTE — ED Notes (Signed)
Dinner tray ordered, carb modified diet 

## 2016-03-01 NOTE — ED Notes (Signed)
Dinner Tray at the bedside

## 2016-03-01 NOTE — Progress Notes (Signed)
ANTICOAGULATION CONSULT NOTE - Initial Consult  Pharmacy Consult for heparin Indication: chest pain/ACS  Allergies  Allergen Reactions  . Nsaids Other (See Comments)    Avoid due to h/o peptic ulcers    Patient Measurements: Height: 5\' 8"  (172.7 cm) Weight: 165 lb (74.8 kg) IBW/kg (Calculated) : 68.4 Heparin Dosing Weight: 74.8  Vital Signs: Temp: 97.7 F (36.5 C) (11/08 1541) Temp Source: Oral (11/08 1541) BP: 115/81 (11/08 1541) Pulse Rate: 87 (11/08 1541)  Labs: No results for input(s): HGB, HCT, PLT, APTT, LABPROT, INR, HEPARINUNFRC, HEPRLOWMOCWT, CREATININE, CKTOTAL, CKMB, TROPONINI in the last 72 hours.  CrCl cannot be calculated (Patient's most recent lab result is older than the maximum 21 days allowed.).   Medical History: Past Medical History:  Diagnosis Date  . Allergic rhinitis   . Anemia   . Arthritis   . Benign neoplasm of colon   . Blood transfusion without reported diagnosis   . Calculus of bile duct without mention of cholecystitis or obstruction   . Coronary atherosclerosis of unspecified type of vessel, native or graft   . Depression    after wife passed away  . Diabetes mellitus    "pills"  . Diverticulosis of colon (without mention of hemorrhage)   . Esophageal reflux   . Hemorrhoids   . Hyperlipidemia   . Hypertension   . Internal hemorrhoids without mention of complication   . Kidney stones   . Myocardial infarction 09/1995  . Osteoporosis   . Other and unspecified hyperlipidemia   . Peptic ulcer, unspecified site, unspecified as acute or chronic, without mention of hemorrhage, perforation, or obstruction   . Pneumonia   . Shortness of breath 03/06/11    "w/exertion"  . Stricture and stenosis of esophagus   . Unspecified sinusitis (chronic)     Assessment: 82 yo male with acute onset of chest pain. Labs pending, no anticoagulation prior to admission. Pharmacy has been consulted to start heparin.   Goal of Therapy:  Heparin level  0.3-0.7 units/ml Monitor platelets by anticoagulation protocol: Yes   Plan:  Give 4000 units bolus x 1 Start heparin infusion at 900 units/hr Check anti-Xa level in 8 hours and daily while on heparin Continue to monitor H&H and platelets  Kevin Rhodes L Kevin Rhodes 03/01/2016,4:00 PM

## 2016-03-01 NOTE — ED Notes (Signed)
Critical troponin called trop 0.06 MD notified

## 2016-03-01 NOTE — ED Provider Notes (Addendum)
Pt with chest pain starting at home. Duration boxing 30 minutes. Was improving on arrival paramedics. Given aspirin and nitroglycerin. His become pain-free prior to arrival. Transmitted EKG shows less than 1 mm ST elevation in lead 3, but no significant morphological change from his most recent EKG. No STEMI criteria. Remains pain-free here. Awake and appears in no distress. Last cath 2012. History of known CAD with stents. Follows with Dr. Alvester Chou of [place] [facility]. Had endoscopy in August. Had a mild esophagitis but no ulcerations. History of previous GI bleed. Given heparin bolus and drip here. Await markers. Will consult cardiology.  CRITICAL CARE Performed by: Tanna Furry JOSEPH   Total critical care time: 30 minutes  Critical care time was exclusive of separately billable procedures and treating other patients.  Critical care was necessary to treat or prevent imminent or life-threatening deterioration.  Critical care was time spent personally by me on the following activities: development of treatment plan with patient and/or surrogate as well as nursing, discussions with consultants, evaluation of patient's response to treatment, examination of patient, obtaining history from patient or surrogate, ordering and performing treatments and interventions, ordering and review of laboratory studies, ordering and review of radiographic studies, pulse oximetry and re-evaluation of patient's condition.    Tanna Furry, MD 03/01/16 [place], MD 03/21/16 1537

## 2016-03-02 ENCOUNTER — Encounter (HOSPITAL_COMMUNITY)
Admission: EM | Disposition: A | Discharge status: Home / Self Care | Payer: Self-pay | Source: Home / Self Care | Attending: Interventional Cardiology

## 2016-03-02 ENCOUNTER — Encounter (HOSPITAL_COMMUNITY): Payer: Self-pay | Admitting: General Practice

## 2016-03-02 DIAGNOSIS — I249 Acute ischemic heart disease, unspecified: Secondary | ICD-10-CM

## 2016-03-02 DIAGNOSIS — I25110 Atherosclerotic heart disease of native coronary artery with unstable angina pectoris: Secondary | ICD-10-CM

## 2016-03-02 HISTORY — PX: CARDIAC CATHETERIZATION: SHX172

## 2016-03-02 HISTORY — PX: CORONARY ANGIOPLASTY WITH STENT PLACEMENT: SHX49

## 2016-03-02 LAB — TROPONIN I
TROPONIN I: 0.48 ng/mL — AB (ref <0.03)
Troponin I: 0.83 ng/mL (ref <0.03)

## 2016-03-02 LAB — CBC
HCT: 30.2 % — ABNORMAL LOW (ref 39.0–52.0)
HEMATOCRIT: 30.4 % — AB (ref 39.0–52.0)
HEMOGLOBIN: 9.3 g/dL — AB (ref 13.0–17.0)
Hemoglobin: 9.3 g/dL — ABNORMAL LOW (ref 13.0–17.0)
MCH: 25.1 pg — AB (ref 26.0–34.0)
MCH: 25.1 pg — AB (ref 26.0–34.0)
MCHC: 30.8 g/dL (ref 30.0–36.0)
MCHC: 30.6 g/dL (ref 30.0–36.0)
MCV: 81.4 fL (ref 78.0–100.0)
MCV: 81.9 fL (ref 78.0–100.0)
PLATELETS: 202 10*3/uL (ref 150–400)
Platelets: 191 10*3/uL (ref 150–400)
RBC: 3.71 MIL/uL — AB (ref 4.22–5.81)
RBC: 3.71 MIL/uL — ABNORMAL LOW (ref 4.22–5.81)
RDW: 16.1 % — ABNORMAL HIGH (ref 11.5–15.5)
RDW: 16.1 % — AB (ref 11.5–15.5)
WBC: 6.1 10*3/uL (ref 4.0–10.5)
WBC: 6.2 10*3/uL (ref 4.0–10.5)

## 2016-03-02 LAB — COMPREHENSIVE METABOLIC PANEL WITH GFR
ALT: 34 U/L (ref 17–63)
AST: 57 U/L — ABNORMAL HIGH (ref 15–41)
Albumin: 3.2 g/dL — ABNORMAL LOW (ref 3.5–5.0)
Alkaline Phosphatase: 74 U/L (ref 38–126)
Anion gap: 6 (ref 5–15)
BUN: 24 mg/dL — ABNORMAL HIGH (ref 6–20)
CO2: 23 mmol/L (ref 22–32)
Calcium: 8.9 mg/dL (ref 8.9–10.3)
Chloride: 112 mmol/L — ABNORMAL HIGH (ref 101–111)
Creatinine, Ser: 1.41 mg/dL — ABNORMAL HIGH (ref 0.61–1.24)
GFR calc Af Amer: 53 mL/min — ABNORMAL LOW
GFR calc non Af Amer: 45 mL/min — ABNORMAL LOW
Glucose, Bld: 137 mg/dL — ABNORMAL HIGH (ref 65–99)
Potassium: 4.1 mmol/L (ref 3.5–5.1)
Sodium: 141 mmol/L (ref 135–145)
Total Bilirubin: 0.9 mg/dL (ref 0.3–1.2)
Total Protein: 6.1 g/dL — ABNORMAL LOW (ref 6.5–8.1)

## 2016-03-02 LAB — PROTIME-INR
INR: 1.13
PROTHROMBIN TIME: 14.5 s (ref 11.4–15.2)

## 2016-03-02 LAB — BASIC METABOLIC PANEL
Anion gap: 8 (ref 5–15)
BUN: 23 mg/dL — AB (ref 6–20)
CHLORIDE: 110 mmol/L (ref 101–111)
CO2: 24 mmol/L (ref 22–32)
CREATININE: 1.45 mg/dL — AB (ref 0.61–1.24)
Calcium: 9.2 mg/dL (ref 8.9–10.3)
GFR calc Af Amer: 51 mL/min — ABNORMAL LOW (ref >60)
GFR, EST NON AFRICAN AMERICAN: 44 mL/min — AB (ref >60)
Glucose, Bld: 157 mg/dL — ABNORMAL HIGH (ref 65–99)
Potassium: 3.9 mmol/L (ref 3.5–5.1)
SODIUM: 142 mmol/L (ref 135–145)

## 2016-03-02 LAB — POCT ACTIVATED CLOTTING TIME: Activated Clotting Time: 324 seconds

## 2016-03-02 LAB — GLUCOSE, CAPILLARY
GLUCOSE-CAPILLARY: 136 mg/dL — AB (ref 65–99)
GLUCOSE-CAPILLARY: 154 mg/dL — AB (ref 65–99)
GLUCOSE-CAPILLARY: 109 mg/dL — AB (ref 65–99)

## 2016-03-02 LAB — LIPID PANEL
CHOLESTEROL: 149 mg/dL (ref 0–200)
HDL: 27 mg/dL — ABNORMAL LOW (ref >40)
LDL Cholesterol: 93 mg/dL (ref 0–99)
Total CHOL/HDL Ratio: 5.5 RATIO
Triglycerides: 143 mg/dL (ref <150)
VLDL: 29 mg/dL (ref 0–40)

## 2016-03-02 LAB — HEPARIN LEVEL (UNFRACTIONATED)
Heparin Unfractionated: 0.26 IU/mL — ABNORMAL LOW (ref 0.30–0.70)
Heparin Unfractionated: 0.36 IU/mL (ref 0.30–0.70)

## 2016-03-02 SURGERY — LEFT HEART CATH AND CORONARY ANGIOGRAPHY
Anesthesia: LOCAL

## 2016-03-02 MED ORDER — BIVALIRUDIN 250 MG IV SOLR
INTRAVENOUS | Status: AC
  Filled 2016-03-02: qty 250

## 2016-03-02 MED ORDER — MORPHINE SULFATE (PF) 4 MG/ML IV SOLN: 2.0000 mg | INTRAVENOUS | Status: DC | PRN

## 2016-03-02 MED ORDER — IOPAMIDOL (ISOVUE-370) INJECTION 76%
INTRAVENOUS | Status: DC | PRN
  Administered 2016-03-02: 110 mL via INTRA_ARTERIAL

## 2016-03-02 MED ORDER — TICAGRELOR 90 MG PO TABS
ORAL_TABLET | ORAL | Status: DC | PRN
  Administered 2016-03-02: 180 mg via ORAL

## 2016-03-02 MED ORDER — HEPARIN (PORCINE) IN NACL 2-0.9 UNIT/ML-% IJ SOLN
INTRAMUSCULAR | Status: DC | PRN
  Administered 2016-03-02: 1000 mL

## 2016-03-02 MED ORDER — SODIUM CHLORIDE 0.9 % IV SOLN
1.7500 mg/kg/h | INTRAVENOUS | Status: AC
  Filled 2016-03-02 (×2): qty 250

## 2016-03-02 MED ORDER — ASPIRIN 81 MG PO CHEW
81.0000 mg | CHEWABLE_TABLET | Freq: Every day | ORAL | Status: DC
  Administered 2016-03-03: 10:00:00 81 mg via ORAL
  Filled 2016-03-02: qty 1

## 2016-03-02 MED ORDER — SODIUM CHLORIDE 0.9% FLUSH: 3.0000 mL | INTRAVENOUS | Status: DC | PRN

## 2016-03-02 MED ORDER — LIDOCAINE HCL (PF) 1 % IJ SOLN
INTRAMUSCULAR | Status: DC | PRN
  Administered 2016-03-02: 20 mL

## 2016-03-02 MED ORDER — MIDAZOLAM HCL 2 MG/2ML IJ SOLN
INTRAMUSCULAR | Status: AC
  Filled 2016-03-02: qty 2

## 2016-03-02 MED ORDER — FENTANYL CITRATE (PF) 100 MCG/2ML IJ SOLN
INTRAMUSCULAR | Status: DC | PRN
  Administered 2016-03-02: 50 ug via INTRAVENOUS
  Administered 2016-03-02 (×2): 25 ug via INTRAVENOUS

## 2016-03-02 MED ORDER — SODIUM CHLORIDE 0.9% FLUSH
3.0000 mL | Freq: Two times a day (BID) | INTRAVENOUS | Status: DC
  Administered 2016-03-02: 3 mL via INTRAVENOUS

## 2016-03-02 MED ORDER — FENTANYL CITRATE (PF) 100 MCG/2ML IJ SOLN
INTRAMUSCULAR | Status: AC
  Filled 2016-03-02: qty 2

## 2016-03-02 MED ORDER — HYDRALAZINE HCL 20 MG/ML IJ SOLN: 10.0000 mg | INTRAMUSCULAR | Status: DC | PRN

## 2016-03-02 MED ORDER — SODIUM CHLORIDE 0.9 % IV SOLN
INTRAVENOUS | Status: DC | PRN
  Administered 2016-03-02 (×2): 1.75 mg/kg/h via INTRAVENOUS

## 2016-03-02 MED ORDER — HEPARIN (PORCINE) IN NACL 2-0.9 UNIT/ML-% IJ SOLN
INTRAMUSCULAR | Status: AC
  Filled 2016-03-02: qty 1000

## 2016-03-02 MED ORDER — ACETAMINOPHEN 325 MG PO TABS: 650.0000 mg | ORAL_TABLET | ORAL | Status: DC | PRN

## 2016-03-02 MED ORDER — BIVALIRUDIN BOLUS VIA INFUSION - CUPID
INTRAVENOUS | Status: DC | PRN
Start: 2016-03-02 — End: 2016-03-02
  Administered 2016-03-02: 61.125 mg via INTRAVENOUS

## 2016-03-02 MED ORDER — ONDANSETRON HCL 4 MG/2ML IJ SOLN: 4.0000 mg | Freq: Four times a day (QID) | INTRAMUSCULAR | Status: DC | PRN

## 2016-03-02 MED ORDER — SODIUM CHLORIDE 0.9 % IV SOLN: 250.0000 mL | INTRAVENOUS | Status: DC | PRN

## 2016-03-02 MED ORDER — LIDOCAINE HCL (PF) 1 % IJ SOLN
INTRAMUSCULAR | Status: AC
  Filled 2016-03-02: qty 30

## 2016-03-02 MED ORDER — TICAGRELOR 90 MG PO TABS
90.0000 mg | ORAL_TABLET | Freq: Two times a day (BID) | ORAL | Status: DC
  Administered 2016-03-03 (×2): 90 mg via ORAL
  Filled 2016-03-02 (×2): qty 1

## 2016-03-02 MED ORDER — ANGIOPLASTY BOOK
Freq: Once | Status: AC
  Administered 2016-03-02: 22:00:00
  Filled 2016-03-02: qty 1

## 2016-03-02 MED ORDER — MIDAZOLAM HCL 2 MG/2ML IJ SOLN
INTRAMUSCULAR | Status: DC | PRN
  Administered 2016-03-02: 1 mg via INTRAVENOUS

## 2016-03-02 MED ORDER — TICAGRELOR 90 MG PO TABS
ORAL_TABLET | ORAL | Status: AC
  Filled 2016-03-02: qty 1

## 2016-03-02 MED ORDER — SODIUM CHLORIDE 0.9 % WEIGHT BASED INFUSION: 3.0000 mL/kg/h | INTRAVENOUS | Status: AC

## 2016-03-02 MED ORDER — HEART ATTACK BOUNCING BOOK
Freq: Once | Status: AC
  Administered 2016-03-02: 21:00:00
  Filled 2016-03-02: qty 1

## 2016-03-02 SURGICAL SUPPLY — 19 items
BALLN MOZEC 2.0X12 (BALLOONS) ×2
BALLN ~~LOC~~ EUPHORA RX 3.25X15 (BALLOONS) ×2
BALLN ~~LOC~~ TREK RX 3.25X12 (BALLOONS) ×2
BALLOON MOZEC 2.0X12 (BALLOONS) IMPLANT
BALLOON ~~LOC~~ EUPHORA RX 3.25X15 (BALLOONS) IMPLANT
BALLOON ~~LOC~~ TREK RX 3.25X12 (BALLOONS) IMPLANT
CATH INFINITI 5FR MULTPACK ANG (CATHETERS) ×1 IMPLANT
CATH VISTA GUIDE 6FR JR4 (CATHETERS) ×1 IMPLANT
GUIDELINER 6F (CATHETERS) ×1 IMPLANT
KIT HEART LEFT (KITS) ×2 IMPLANT
PACK CARDIAC CATHETERIZATION (CUSTOM PROCEDURE TRAY) ×2 IMPLANT
SHEATH PINNACLE 5F 10CM (SHEATH) ×1 IMPLANT
SHEATH PINNACLE 6F 10CM (SHEATH) ×1 IMPLANT
STENT SYNERGY DES 3X12 (Permanent Stent) ×1 IMPLANT
STENT SYNERGY DES 3X16 (Permanent Stent) ×1 IMPLANT
TRANSDUCER W/STOPCOCK (MISCELLANEOUS) ×2 IMPLANT
TUBING CIL FLEX 10 FLL-RA (TUBING) ×3 IMPLANT
WIRE ASAHI PROWATER 180CM (WIRE) ×1 IMPLANT
WIRE J 3MM .035X145CM (WIRE) ×1 IMPLANT

## 2016-03-02 NOTE — H&P (View-Only) (Signed)
Patient Name: Kevin Rhodes Date of Encounter: 03/02/2016  Primary Cardiologist: Adora Fridge, M.D.  Hospital Problem List     Active Problems:   Coronary atherosclerosis   ESOPHAGEAL STRICTURE   Anemia due to blood loss, chronic   Cirrhosis (HCC)   Essential hypertension   Hyperlipidemia   Non-ST elevation MI (NSTEMI) ([place])   Diabetes mellitus type II, uncontrolled (HCC)   Chronic kidney disease, stage III (moderate)   Acute coronary syndrome (HCC)     Subjective   45-60 minute episode of typical angina with radiation into the neck. Subsequent mildly elevated troponin. No recurrent chest discomfort since admission.  Inpatient Medications    Scheduled Meds: . amLODipine  5 mg Oral Daily  . aspirin  81 mg Oral QHS  . clopidogrel  75 mg Oral Daily  . glimepiride  4 mg Oral Q breakfast  . insulin aspart  0-15 Units Subcutaneous TID WC  . iron polysaccharides  150 mg Oral BID  . isosorbide mononitrate  15 mg Oral Daily  . metoprolol  50 mg Oral BID  . omega-3 acid ethyl esters  1 g Oral BID  . pantoprazole  40 mg Oral BID  . pravastatin  20 mg Oral QHS  . sodium chloride flush  3 mL Intravenous Q12H  . sodium chloride flush  3 mL Intravenous Q12H  . timolol  1 drop Both Eyes Daily   Continuous Infusions: . sodium chloride 1 mL/kg/hr (03/02/16 0700)  . heparin 900 Units/hr (03/02/16 0602)   PRN Meds: sodium chloride, sodium chloride, acetaminophen, nitroGLYCERIN, ondansetron (ZOFRAN) IV, sodium chloride flush, sodium chloride flush, traMADol   Vital Signs    Vitals:   03/01/16 2134 03/01/16 2146 03/02/16 0548 03/02/16 0818  BP: (!) 144/104  136/65 130/66  Pulse:   (!) 48 (!) 46  Resp: 16  17 14   Temp: 99.1 F (37.3 C)  97.9 F (36.6 C) 97.9 F (36.6 C)  TempSrc: Oral  Oral Oral  SpO2: 99% 99% 100% 99%  Weight: 182 lb 8 oz (82.8 kg)  179 lb 9.6 oz (81.5 kg)   Height: 5\' 8"  (1.727 m)  5\' 8"  (1.727 m)     Intake/Output Summary (Last 24 hours) at 03/02/16  0953 Last data filed at 03/02/16 0906  Gross per 24 hour  Intake           749.45 ml  Output              600 ml  Net           149.45 ml   Filed Weights   03/01/16 1539 03/01/16 2134 03/02/16 0548  Weight: 165 lb (74.8 kg) 182 lb 8 oz (82.8 kg) 179 lb 9.6 oz (81.5 kg)    Physical Exam   Family and room with the patient. GEN: Well nourished, well developed, in no acute distress.  HEENT: Grossly normal. Scleral icterus noted on yesterday's exam does not appear to be present and this is confirmed by normal bilirubin and laboratory data. Neck: Supple, no JVD, carotid bruits, or masses. Cardiac: 2/6 crescendo decrescendo systolic murmur of aortic stenosis. The rhythm is regular. No rub or gallop. No clubbing, cyanosis, edema.  Radials/DP/PT 2+ and equal bilaterally.  Respiratory:  Respirations regular and unlabored, clear to auscultation bilaterally. GI: Soft, nontender, nondistended, BS + x 4. MS: no deformity or atrophy. Skin: warm and dry, no rash. Neuro:  Strength and sensation are intact. Psych: AAOx3.  Normal affect.  Labs  CBC  Recent Labs  03/02/16 0323 03/02/16 0624  WBC 6.1 6.2  HGB 9.3* 9.3*  HCT 30.2* 30.4*  MCV 81.4 81.9  PLT 191 [id number]   Basic Metabolic Panel  Recent Labs  03/02/16 0323 03/02/16 0624  NA 142 141  K 3.9 4.1  CL 110 112*  CO2 24 23  GLUCOSE 157* 137*  BUN 23* 24*  CREATININE 1.45* 1.41*  CALCIUM 9.2 8.9   Liver Function Tests  Recent Labs  03/02/16 0624  AST 57*  ALT 34  ALKPHOS 74  BILITOT 0.9  PROT 6.1*  ALBUMIN 3.2*   No results for input(s): LIPASE, AMYLASE in the last 72 hours. Cardiac Enzymes  Recent Labs  03/01/16 2030 03/01/16 2146 03/02/16 0323  TROPONINI 0.79* 1.07* 0.83*   BNP Invalid input(s): POCBNP D-Dimer No results for input(s): DDIMER in the last 72 hours. Hemoglobin A1C No results for input(s): HGBA1C in the last 72 hours. Fasting Lipid Panel  Recent Labs  03/02/16 0323  CHOL 149  HDL 27*   LDLCALC 93  TRIG 143  CHOLHDL 5.5   Thyroid Function Tests No results for input(s): TSH, T4TOTAL, T3FREE, THYROIDAB in the last 72 hours.  Invalid input(s): FREET3  Telemetry    Normal sinus rhythm with rare PAC C and PVC. - Personally Reviewed  ECG    Sinus bradycardia, old inferior infarct, poor wave progression, interventricular conduction delay. - Personally Reviewed  Radiology    Dg Chest 2 View  Result Date: 03/01/2016 CLINICAL DATA:  Left arm and chest pain. EXAM: CHEST  2 VIEW COMPARISON:  03/06/2011 FINDINGS: Mild elevation of the right hemidiaphragm is stable. Mild enlargement of the cardiac silhouette is stable. The trachea is midline. The lungs are clear without airspace disease or pulmonary edema. No pleural effusions. IMPRESSION: No acute cardiopulmonary disease. Stable mild cardiomegaly. Electronically Signed   By: Markus Daft M.D.   On: 03/01/2016 16:08    Cardiac Studies   No new data  Patient Profile     82 year old gentleman with prior history of acute inferior infarct treated with stent 1997. 2012 subsequently had RCA and LAD stenting with DES. Had GI bleed after DES stents in 2012. Has history of iron deficiency anemia. Has previously received transfusions. Other problems include diabetes, stage III chronic kidney disease, and hypertension.  Assessment & Plan    1. Non-ST elevation myocardial infarction.  Plan is for cardiac catheterization and possible PCI today.The patient was counseled to undergo left heart catheterization, coronary angiography, and possible percutaneous coronary intervention with stent implantation. The procedural risks and benefits were discussed in detail. The risks discussed included death, stroke, myocardial infarction, life-threatening bleeding, limb ischemia, kidney injury, allergy, and possible emergency cardiac surgery. The risk of these significant complications were estimated to occur less than 1% of the time. After discussion,  the patient has agreed to proceed. 2. CKD, stage III, stable and we'll continue IV hydration prior to cath. 3. Jaundice is not present. 4. Anemia with prior history of GI bleeding on dual antiplatelet therapy in the past. Known iron deficiency.    Signed, Sinclair Grooms, MD  03/02/2016, 9:53 AM

## 2016-03-02 NOTE — Interval H&P Note (Signed)
Cath Lab Visit (complete for each Cath Lab visit)  Clinical Evaluation Leading to the Procedure:   ACS: Yes.    Non-ACS:    Anginal Classification: CCS IV  Anti-ischemic medical therapy: Maximal Therapy (2 or more classes of medications)  Non-Invasive Test Results: No non-invasive testing performed  Prior CABG: No previous CABG      History and Physical Interval Note:  03/02/2016 2:12 PM  Kevin Rhodes  has presented today for surgery, with the diagnosis of NSTEMI  The various methods of treatment have been discussed with the patient and family. After consideration of risks, benefits and other options for treatment, the patient has consented to  Procedure(s): Left Heart Cath and Coronary Angiography (N/A) as a surgical intervention .  The patient's history has been reviewed, patient examined, no change in status, stable for surgery.  I have reviewed the patient's chart and labs.  Questions were answered to the patient's satisfaction.     Quay Burow

## 2016-03-02 NOTE — Progress Notes (Signed)
ANTICOAGULATION CONSULT NOTE - Follow Up Consult  Pharmacy Consult for heparin Indication: chest pain/ACS  Labs:  Recent Labs  03/01/16 1627 03/01/16 2030 03/01/16 2146 03/02/16 0004  HGB 9.4*  --   --   --   HCT 30.4*  --   --   --   PLT 201  --   --   --   HEPARINUNFRC  --   --   --  0.36  CREATININE 1.50*  --   --   --   TROPONINI 0.06* 0.79* 1.07*  --      Assessment/Plan:  82yo male therapeutic on heparin with initial dosing for CP. Will continue gtt at current rate and confirm stable with am labs.   Wynona Neat, PharmD, BCPS  03/02/2016,1:16 AM

## 2016-03-02 NOTE — Progress Notes (Addendum)
Patient Name: Kevin Rhodes Date of Encounter: 03/02/2016  Primary Cardiologist: Adora Fridge, M.D.  Hospital Problem List     Active Problems:   Coronary atherosclerosis   ESOPHAGEAL STRICTURE   Anemia due to blood loss, chronic   Cirrhosis (HCC)   Essential hypertension   Hyperlipidemia   Non-ST elevation MI (NSTEMI) ([place])   Diabetes mellitus type II, uncontrolled (HCC)   Chronic kidney disease, stage III (moderate)   Acute coronary syndrome (HCC)     Subjective   45-60 minute episode of typical angina with radiation into the neck. Subsequent mildly elevated troponin. No recurrent chest discomfort since admission.  Inpatient Medications    Scheduled Meds: . amLODipine  5 mg Oral Daily  . aspirin  81 mg Oral QHS  . clopidogrel  75 mg Oral Daily  . glimepiride  4 mg Oral Q breakfast  . insulin aspart  0-15 Units Subcutaneous TID WC  . iron polysaccharides  150 mg Oral BID  . isosorbide mononitrate  15 mg Oral Daily  . metoprolol  50 mg Oral BID  . omega-3 acid ethyl esters  1 g Oral BID  . pantoprazole  40 mg Oral BID  . pravastatin  20 mg Oral QHS  . sodium chloride flush  3 mL Intravenous Q12H  . sodium chloride flush  3 mL Intravenous Q12H  . timolol  1 drop Both Eyes Daily   Continuous Infusions: . sodium chloride 1 mL/kg/hr (03/02/16 0700)  . heparin 900 Units/hr (03/02/16 0602)   PRN Meds: sodium chloride, sodium chloride, acetaminophen, nitroGLYCERIN, ondansetron (ZOFRAN) IV, sodium chloride flush, sodium chloride flush, traMADol   Vital Signs    Vitals:   03/01/16 2134 03/01/16 2146 03/02/16 0548 03/02/16 0818  BP: (!) 144/104  136/65 130/66  Pulse:   (!) 48 (!) 46  Resp: 16  17 14   Temp: 99.1 F (37.3 C)  97.9 F (36.6 C) 97.9 F (36.6 C)  TempSrc: Oral  Oral Oral  SpO2: 99% 99% 100% 99%  Weight: 182 lb 8 oz (82.8 kg)  179 lb 9.6 oz (81.5 kg)   Height: 5\' 8"  (1.727 m)  5\' 8"  (1.727 m)     Intake/Output Summary (Last 24 hours) at 03/02/16  0953 Last data filed at 03/02/16 0906  Gross per 24 hour  Intake           749.45 ml  Output              600 ml  Net           149.45 ml   Filed Weights   03/01/16 1539 03/01/16 2134 03/02/16 0548  Weight: 165 lb (74.8 kg) 182 lb 8 oz (82.8 kg) 179 lb 9.6 oz (81.5 kg)    Physical Exam   Family and room with the patient. GEN: Well nourished, well developed, in no acute distress.  HEENT: Grossly normal. Scleral icterus noted on yesterday's exam does not appear to be present and this is confirmed by normal bilirubin and laboratory data. Neck: Supple, no JVD, carotid bruits, or masses. Cardiac: 2/6 crescendo decrescendo systolic murmur of aortic stenosis. The rhythm is regular. No rub or gallop. No clubbing, cyanosis, edema.  Radials/DP/PT 2+ and equal bilaterally.  Respiratory:  Respirations regular and unlabored, clear to auscultation bilaterally. GI: Soft, nontender, nondistended, BS + x 4. MS: no deformity or atrophy. Skin: warm and dry, no rash. Neuro:  Strength and sensation are intact. Psych: AAOx3.  Normal affect.  Labs  CBC  Recent Labs  03/02/16 0323 03/02/16 0624  WBC 6.1 6.2  HGB 9.3* 9.3*  HCT 30.2* 30.4*  MCV 81.4 81.9  PLT 191 [id number]   Basic Metabolic Panel  Recent Labs  03/02/16 0323 03/02/16 0624  NA 142 141  K 3.9 4.1  CL 110 112*  CO2 24 23  GLUCOSE 157* 137*  BUN 23* 24*  CREATININE 1.45* 1.41*  CALCIUM 9.2 8.9   Liver Function Tests  Recent Labs  03/02/16 0624  AST 57*  ALT 34  ALKPHOS 74  BILITOT 0.9  PROT 6.1*  ALBUMIN 3.2*   No results for input(s): LIPASE, AMYLASE in the last 72 hours. Cardiac Enzymes  Recent Labs  03/01/16 2030 03/01/16 2146 03/02/16 0323  TROPONINI 0.79* 1.07* 0.83*   BNP Invalid input(s): POCBNP D-Dimer No results for input(s): DDIMER in the last 72 hours. Hemoglobin A1C No results for input(s): HGBA1C in the last 72 hours. Fasting Lipid Panel  Recent Labs  03/02/16 0323  CHOL 149  HDL 27*   LDLCALC 93  TRIG 143  CHOLHDL 5.5   Thyroid Function Tests No results for input(s): TSH, T4TOTAL, T3FREE, THYROIDAB in the last 72 hours.  Invalid input(s): FREET3  Telemetry    Normal sinus rhythm with rare PAC C and PVC. - Personally Reviewed  ECG    Sinus bradycardia, old inferior infarct, poor wave progression, interventricular conduction delay. - Personally Reviewed  Radiology    Dg Chest 2 View  Result Date: 03/01/2016 CLINICAL DATA:  Left arm and chest pain. EXAM: CHEST  2 VIEW COMPARISON:  03/06/2011 FINDINGS: Mild elevation of the right hemidiaphragm is stable. Mild enlargement of the cardiac silhouette is stable. The trachea is midline. The lungs are clear without airspace disease or pulmonary edema. No pleural effusions. IMPRESSION: No acute cardiopulmonary disease. Stable mild cardiomegaly. Electronically Signed   By: Markus Daft M.D.   On: 03/01/2016 16:08    Cardiac Studies   No new data  Patient Profile     82 year old gentleman with prior history of acute inferior infarct treated with stent 1997. 2012 subsequently had RCA and LAD stenting with DES. Had GI bleed after DES stents in 2012. Has history of iron deficiency anemia. Has previously received transfusions. Other problems include diabetes, stage III chronic kidney disease, and hypertension.  Assessment & Plan    1. Non-ST elevation myocardial infarction.  Plan is for cardiac catheterization and possible PCI today.The patient was counseled to undergo left heart catheterization, coronary angiography, and possible percutaneous coronary intervention with stent implantation. The procedural risks and benefits were discussed in detail. The risks discussed included death, stroke, myocardial infarction, life-threatening bleeding, limb ischemia, kidney injury, allergy, and possible emergency cardiac surgery. The risk of these significant complications were estimated to occur less than 1% of the time. After discussion,  the patient has agreed to proceed. 2. CKD, stage III, stable and we'll continue IV hydration prior to cath. 3. Jaundice is not present. 4. Anemia with prior history of GI bleeding on dual antiplatelet therapy in the past. Known iron deficiency.    Signed, Sinclair Grooms, MD  03/02/2016, 9:53 AM

## 2016-03-02 NOTE — Progress Notes (Signed)
Site area: right groin  Site Prior to Removal:  Level 0  Pressure Applied For 25 MINUTES    Minutes Beginning at 2020  Manual:   Yes.    Patient Status During Pull:  Patient A&O by four, no pain. Pleasant, no s/s of distress or complaints voiced.    Post Pull Groin Site:  Level 0  Post Pull Instructions Given:  Yes.    Post Pull Pulses Present:  Yes.    Dressing Applied:  Yes.    Comments:  Pressure dressing applied C/D/I. Patient tolerated procedure well. Patient educated on bedrest instruction. Teach back utilized.

## 2016-03-02 NOTE — Progress Notes (Signed)
ANTICOAGULATION CONSULT NOTE - Follow Up Consult  Pharmacy Consult for heparin Indication: chest pain/ACS  Allergies  Allergen Reactions  . Nsaids Other (See Comments)    Avoid due to h/o peptic ulcers  . Ambien [Zolpidem] Other (See Comments)    Mental changes    Patient Measurements: Height: 5\' 8"  (172.7 cm) Weight: 179 lb 9.6 oz (81.5 kg) IBW/kg (Calculated) : 68.4  Vital Signs: Temp: 97.9 F (36.6 C) (11/09 0818) Temp Source: Oral (11/09 0818) BP: 130/66 (11/09 0818) Pulse Rate: 49 (11/09 1003)  Labs:  Recent Labs  03/01/16 1627  03/01/16 2146 03/02/16 0004 03/02/16 0323 03/02/16 0624 03/02/16 0952  HGB 9.4*  --   --   --  9.3* 9.3*  --   HCT 30.4*  --   --   --  30.2* 30.4*  --   PLT 201  --   --   --  191 202  --   LABPROT  --   --   --   --   --  14.5  --   INR  --   --   --   --   --  1.13  --   HEPARINUNFRC  --   --   --  0.36  --   --   --   CREATININE 1.50*  --   --   --  1.45* 1.41*  --   TROPONINI 0.06*  < > 1.07*  --  0.83*  --  0.48*  < > = values in this interval not displayed.  Estimated Creatinine Clearance: 40.4 mL/min (by C-G formula based on SCr of 1.41 mg/dL (H)).  Assessment: 82yo male with NSTEMI, on heparin 900 units/hr.  Confirmatory heparin level not done this AM.  Hg stable and pltc wnl.  No bleeding noted.  Plan for cath today, but pt not on schedule at this time.  RN states possibly late afternoon.  Goal of Therapy:  Heparin level 0.3-0.7 units/ml Monitor platelets by anticoagulation protocol: Yes   Plan:  Continue heparin at current rate Repeat heparin level now F/U plans for cath   Gracy Bruins, PharmD Clinical Pharmacist [facility]

## 2016-03-03 ENCOUNTER — Encounter (HOSPITAL_COMMUNITY): Payer: Self-pay | Admitting: Cardiovascular Disease

## 2016-03-03 ENCOUNTER — Telehealth: Payer: Self-pay | Admitting: Cardiovascular Disease

## 2016-03-03 LAB — BASIC METABOLIC PANEL
Anion gap: 6 (ref 5–15)
BUN: 19 mg/dL (ref 6–20)
CALCIUM: 8.6 mg/dL — AB (ref 8.9–10.3)
CHLORIDE: 113 mmol/L — AB (ref 101–111)
CO2: 22 mmol/L (ref 22–32)
CREATININE: 1.32 mg/dL — AB (ref 0.61–1.24)
GFR calc Af Amer: 57 mL/min — ABNORMAL LOW (ref >60)
GFR calc non Af Amer: 49 mL/min — ABNORMAL LOW (ref >60)
GLUCOSE: 159 mg/dL — AB (ref 65–99)
Potassium: 4.4 mmol/L (ref 3.5–5.1)
Sodium: 141 mmol/L (ref 135–145)

## 2016-03-03 LAB — CBC
HEMATOCRIT: 26.2 % — AB (ref 39.0–52.0)
HEMOGLOBIN: 8.3 g/dL — AB (ref 13.0–17.0)
MCH: 25.9 pg — ABNORMAL LOW (ref 26.0–34.0)
MCHC: 31.7 g/dL (ref 30.0–36.0)
MCV: 81.9 fL (ref 78.0–100.0)
Platelets: 172 10*3/uL (ref 150–400)
RBC: 3.2 MIL/uL — AB (ref 4.22–5.81)
RDW: 16.5 % — ABNORMAL HIGH (ref 11.5–15.5)
WBC: 5.2 10*3/uL (ref 4.0–10.5)

## 2016-03-03 LAB — GLUCOSE, CAPILLARY: Glucose-Capillary: 168 mg/dL — ABNORMAL HIGH (ref 65–99)

## 2016-03-03 MED ORDER — NITROGLYCERIN 0.4 MG SL SUBL: 0.4000 mg | SUBLINGUAL_TABLET | SUBLINGUAL | 2 refills | Status: DC | PRN

## 2016-03-03 MED ORDER — PRAVASTATIN SODIUM 40 MG PO TABS: 20.0000 mg | ORAL_TABLET | Freq: Every day | ORAL | 6 refills | Status: AC

## 2016-03-03 MED ORDER — TICAGRELOR 90 MG PO TABS: 90.0000 mg | ORAL_TABLET | Freq: Two times a day (BID) | ORAL | Status: DC

## 2016-03-03 NOTE — Progress Notes (Signed)
CARDIAC REHAB PHASE I   PRE:  Rate/Rhythm: 68 SR  BP:  Sitting: 139/69        SaO2: 99 RA  MODE:  Ambulation: 400 ft   POST:  Rate/Rhythm: 68 SR  BP:  Sitting: 140/65         SaO2: 98 RA  Pt ambulated 400 ft on RA, handheld assist, steady gait, steady gait, tolerated well.  Pt c/o mild DOE, states it is much improved, denies cp, dizziness, declined rest stop. Completed MI/stent education.  Reviewed risk factors, MI book, anti-platelet therapy, stent card, activity restrictions, ntg, exercise, heart healthy diet, carb counting, and phase 2 cardiac rehab. Pt verbalized understanding. Pt agrees to phase 2 cardiac rehab referral, will send to [facility] per pt request. Pt to recliner after walk, call bell within reach.  [place], RN, BSN 03/03/2016 9:11 AM

## 2016-03-03 NOTE — Care Management (Addendum)
1007 03-03-16 Jacqlyn Krauss, RN,BSN [phone] Benefits check was in process for patient in regards to Kevin Rhodes. Co pay will be: [employer] health plan part D copay will be $47- prior auth not required. However, pt is a part of the [employer]. No needs from CM @ this time for medication assistance.

## 2016-03-03 NOTE — Discharge Summary (Addendum)
Discharge Summary    Patient ID: Kevin Rhodes,  MRN: [id number], DOB/AGE: 1933/12/22 82 y.o.  Admit date: 03/01/2016 Discharge date: 03/07/2016  Primary Care Provider: [name] Primary Cardiologist: Dr. Gwenlyn Found   Discharge Diagnoses    Active Problems:   Coronary atherosclerosis   ESOPHAGEAL STRICTURE   Anemia due to blood loss, chronic   Cirrhosis ([place])   Essential hypertension   Hyperlipidemia   NSTEMI (non-ST elevated myocardial infarction) ([place])   DM (diabetes mellitus), type 2, uncontrolled with complications ([place])   Chronic kidney disease, stage III (moderate)   Acute coronary syndrome ([place])    History of Present Illness     Kevin Rhodes is a 82 y.o. male with past medical history of CAD (DES to RCA and LAD in 2012), HTN, HLD, and Type 2 DM who presented to [facility] ED on 03/01/2016 for progressive dyspnea.   Reported this had been occurring for the past several months. On the day of admission, he had experienced chest pressure with exertion. Noted associated neck pain, fatigue, diaphoresis, and dyspnea. The episode lasted 45 minutes before gradually resolving.   While in the ED, his initial labs showed a WBC of 5.0, Hgb 9.4, and platelets 201. Creatinine 1.50. Initial troponin was elevated to 0.06. EKG showed NSR with an old inferior infarct and interventricular conduction delay. He was started on IV Heparin and IVF for hydration with anticipation of a cardiac catheterization the following day.  Hospital Course     Consultants: None   The following morning, he denied any repeat episodes of chest pain. Cyclic troponin values peaked at 1.07. He was taken for a cardiac catheterization which showed a 70% ost RCA stenosis followed by a mid to distal 80% RCA stenosis. This was treated with placement of 2 Synergy DES. No complications were noted during the procedure. He has enrolled in the TWILIGHT study and will be on ASA and Brilinta.  On 03/03/2016, he denied  any repeat episodes of recurrent angina. Cath site was stable with no evidence of a hematoma. Labs showed a mild drop in Hgb from 9.3 to 8.3 which was thought to be of a dilutional etiology. Creatinine was stable at 1.32.  He was last examined by Dr. Tamala Julian and deemed stable for discharge. A 7-day TOC appointment has been arranged and he will need a repeat BMET and CBC at that time. Was instructed to resume Metformin 48 hours following his cath. ASA and Brilinta were provided by the TWILIGHT study. He was discharged home in good condition.   _____________  Discharge Vitals Blood pressure 139/69, pulse (!) 54, temperature 97.7 F (36.5 C), temperature source Oral, resp. rate 14, height 5\' 8"  (1.727 m), weight 175 lb 8 oz (79.6 kg), SpO2 99 %.  Filed Weights   03/01/16 2134 03/02/16 0548 03/03/16 0433  Weight: 182 lb 8 oz (82.8 kg) 179 lb 9.6 oz (81.5 kg) 175 lb 8 oz (79.6 kg)    Labs & Radiologic Studies     CBC No results for input(s): WBC, NEUTROABS, HGB, HCT, MCV, PLT in the last 72 hours. Basic Metabolic Panel No results for input(s): NA, K, CL, CO2, GLUCOSE, BUN, CREATININE, CALCIUM, MG, PHOS in the last 72 hours. Liver Function Tests No results for input(s): AST, ALT, ALKPHOS, BILITOT, PROT, ALBUMIN in the last 72 hours. No results for input(s): LIPASE, AMYLASE in the last 72 hours. Cardiac Enzymes No results for input(s): CKTOTAL, CKMB, CKMBINDEX, TROPONINI in the last 72  hours. BNP Invalid input(s): POCBNP D-Dimer No results for input(s): DDIMER in the last 72 hours. Hemoglobin A1C No results for input(s): HGBA1C in the last 72 hours. Fasting Lipid Panel No results for input(s): CHOL, HDL, LDLCALC, TRIG, CHOLHDL, LDLDIRECT in the last 72 hours. Thyroid Function Tests No results for input(s): TSH, T4TOTAL, T3FREE, THYROIDAB in the last 72 hours.  Invalid input(s): FREET3  Dg Chest 2 View  Result Date: 03/01/2016 CLINICAL DATA:  Left arm and chest pain. EXAM: CHEST  2  VIEW COMPARISON:  03/06/2011 FINDINGS: Mild elevation of the right hemidiaphragm is stable. Mild enlargement of the cardiac silhouette is stable. The trachea is midline. The lungs are clear without airspace disease or pulmonary edema. No pleural effusions. IMPRESSION: No acute cardiopulmonary disease. Stable mild cardiomegaly. Electronically Signed   By: Markus Daft M.D.   On: 03/01/2016 16:08     Diagnostic Studies/Procedures     Cardiac Catheterization: 03/02/2016  Ost LAD to Mid LAD lesion, 0 %stenosed.  Dist RCA lesion, 40 %stenosed.  Mid RCA to Dist RCA lesion, 80 %stenosed.  Post intervention, there is a 0% residual stenosis.  A stent was successfully placed.  Ost RCA to Mid RCA lesion, 70 %stenosed.  Post intervention, there is a 0% residual stenosis.  A stent was successfully placed.   IMPRESSION: Successful proximal and distal dominant RCA PCI and drug-eluting stenting using Synergy drug-eluting stents, Angiomax, aspirin and Brilinta in the "twilight"  Trial. The patient left the lab in stable condition.  Disposition   Pt is being discharged home today in good condition.  Follow-up Plans & Appointments    Follow-up Information    Kevin Rhodes, [place] Follow up on 03/13/2016.   Specialties:  Cardiology, Radiology Why:  Cardiology Hospital Follow-Up on 03/13/2016 at 8:30AM. Contact information: [address] [place] [address] [phone]          Discharge Instructions    AMB Referral to Cardiac Rehabilitation - Phase II    Complete by:  As directed    Diagnosis:  NSTEMI   Amb Referral to Cardiac Rehabilitation    Complete by:  As directed    Diagnosis:   Coronary Stents NSTEMI     Diet - low sodium heart healthy    Complete by:  As directed    Discharge instructions    Complete by:  As directed    PLEASE REMEMBER TO BRING ALL OF YOUR MEDICATIONS TO EACH OF YOUR FOLLOW-UP OFFICE VISITS.  PLEASE ATTEND ALL SCHEDULED FOLLOW-UP  APPOINTMENTS.   Activity: Increase activity slowly as tolerated. You may shower, but no soaking baths (or swimming) for 1 week. No driving for 24 hours. No lifting over 5 lbs for 1 week. No sexual activity for 1 week.   You May Return to Work: in 1 week (if applicable)  Wound Care: You may wash cath site gently with soap and water. Keep cath site clean and dry. If you notice pain, swelling, bleeding or pus at your cath site, please call [phone].   Increase activity slowly    Complete by:  As directed       Discharge Medications     Medication List    STOP taking these medications   clopidogrel 75 MG tablet Commonly known as:  PLAVIX     TAKE these medications   amLODipine 5 MG tablet Commonly known as:  NORVASC TAKE 1 TABLET (5 MG TOTAL) BY MOUTH DAILY.   glimepiride 2 MG tablet Commonly known as:  AMARYL Take 4 mg by mouth daily.   hydrochlorothiazide 12.5 MG capsule Commonly known as:  MICROZIDE TAKE ONE CAPSULE BY MOUTH EVERY DAY   irbesartan 300 MG tablet Commonly known as:  AVAPRO TAKE 1 TABLET (300 MG TOTAL) BY MOUTH AT BEDTIME.   isosorbide mononitrate 30 MG 24 hr tablet Commonly known as:  IMDUR TAKE 1/2 TABLET BY MOUTH EVERY DAY *KEEP APPT*   metFORMIN 500 MG tablet Commonly known as:  GLUCOPHAGE Take 1,000 mg by mouth 2 (two) times daily with a meal. Notes to patient:  [place] DOSE ON 03/04/2016.   metoprolol 50 MG tablet Commonly known as:  LOPRESSOR TAKE 1 TABLET (50 MG TOTAL) BY MOUTH 2 (TWO) TIMES DAILY.   nitroGLYCERIN 0.4 MG SL tablet Commonly known as:  NITROSTAT Place 1 tablet (0.4 mg total) under the tongue every 5 (five) minutes x 3 doses as needed for chest pain.   omega-3 acid ethyl esters 1 g capsule Commonly known as:  LOVAZA Take 1 g by mouth 2 (two) times daily.   oxyCODONE-acetaminophen 5-325 MG tablet Commonly known as:  PERCOCET Take 1-2 tablets by mouth every 4 (four) hours as needed.   pantoprazole 40 MG  tablet Commonly known as:  PROTONIX Take 1 tablet (40 mg total) by mouth 2 (two) times daily. Take 40mg .   twice daily.  30 minutes before breakfast and 30 minutes before evening meal.   polysaccharide iron 150 MG capsule Generic drug:  iron polysaccharides Take 150 mg by mouth 2 (two) times daily.   pravastatin 40 MG tablet Commonly known as:  PRAVACHOL Take 0.5 tablets (20 mg total) by mouth at bedtime. What changed:  how much to take   timolol 0.5 % ophthalmic solution Commonly known as:  TIMOPTIC Place 1 drop into both eyes daily.   traMADol 50 MG tablet Commonly known as:  ULTRAM Take 50 mg by mouth every 8 (eight) hours as needed (for pain).       Aspirin prescribed at discharge?  Yes High Intensity Statin Prescribed? (Lipitor 40-80mg  or Crestor 20-40mg ): No: On Pravastatin Beta Blocker Prescribed? Yes For EF 40% or less, Was ACEI/ARB Prescribed? No: N/A ADP Receptor Inhibitor Prescribed? (i.e. Plavix etc.-Includes Medically Managed Patients): Yes For EF <40%, Aldosterone Inhibitor Prescribed? No: N/A, EF > 40% Was EF assessed during THIS hospitalization? Yes Was Cardiac Rehab II ordered? (Included Medically managed Patients): Yes   Allergies Allergies  Allergen Reactions  . Nsaids Other (See Comments)    Avoid due to h/o peptic ulcers  . Ambien [Zolpidem] Other (See Comments)    Mental changes     Outstanding Labs/Studies   BMET and CBC at follow-up appointment.  Duration of Discharge Encounter   Greater than 30 minutes including physician time.  Signed, [place], PA-C 03/07/2016, 5:05 PM The patient has been seen in conjunction with [place], PA-C. All aspects of care have been considered and discussed. The patient has been personally interviewed, examined, and all clinical data has been reviewed.   Ready for discharge. Please see my note from earlier today.

## 2016-03-03 NOTE — Research (Signed)
TWILIGHT Informed Consent   Subject Name: [name]  Subject met inclusion and exclusion criteria.  The informed consent form, study requirements and expectations were reviewed with the subject and questions and concerns were addressed prior to the signing of the consent form.  The subject verbalized understanding of the trail requirements.  The subject agreed to participate in the TWILIGHT trial and signed the informed consent.  The informed consent was obtained prior to performance of any protocol-specific procedures for the subject.  A copy of the signed informed consent was given to the subject and a copy was placed in the subject's medical record.  [name] 03/03/2016, [phone]

## 2016-03-03 NOTE — Telephone Encounter (Signed)
Current admission - call 11/13.

## 2016-03-03 NOTE — Progress Notes (Signed)
Patient Name: Kevin Rhodes Date of Encounter: 03/03/2016  Primary Cardiologist: Gwenlyn Found, M.D.  Hospital Problem List     Active Problems:   Coronary atherosclerosis   ESOPHAGEAL STRICTURE   Anemia due to blood loss, chronic   Cirrhosis ([place])   Essential hypertension   Hyperlipidemia   NSTEMI (non-ST elevated myocardial infarction) ([place])   Diabetes mellitus type II, uncontrolled ([facility])   Chronic kidney disease, stage III (moderate)   Acute coronary syndrome ([place])     Subjective   He feels well this morning. No groin discomfort. No recurrent angina.  Inpatient Medications    Scheduled Meds: . amLODipine  5 mg Oral Daily  . aspirin  81 mg Oral Daily  . glimepiride  4 mg Oral Q breakfast  . insulin aspart  0-15 Units Subcutaneous TID WC  . iron polysaccharides  150 mg Oral BID  . isosorbide mononitrate  15 mg Oral Daily  . metoprolol  50 mg Oral BID  . omega-3 acid ethyl esters  1 g Oral BID  . pantoprazole  40 mg Oral BID  . pravastatin  20 mg Oral QHS  . sodium chloride flush  3 mL Intravenous Q12H  . ticagrelor  90 mg Oral BID  . timolol  1 drop Both Eyes Daily   Continuous Infusions:  PRN Meds: sodium chloride, acetaminophen, hydrALAZINE, morphine injection, nitroGLYCERIN, ondansetron (ZOFRAN) IV, sodium chloride flush, traMADol   Vital Signs    Vitals:   03/02/16 2142 03/02/16 2145 03/02/16 2200 03/03/16 0433  BP: (!) 159/68 (!) 169/75 132/85 (!) 148/62  Pulse: 87 71 64 (!) 49  Resp:  16 16 17   Temp:    97 F (36.1 C)  TempSrc:    Oral  SpO2:  99% 100% 98%  Weight:    175 lb 8 oz (79.6 kg)  Height:        Intake/Output Summary (Last 24 hours) at 03/03/16 0813 Last data filed at 03/03/16 0400  Gross per 24 hour  Intake           2032.5 ml  Output             1150 ml  Net            882.5 ml   Filed Weights   03/01/16 2134 03/02/16 0548 03/03/16 0433  Weight: 182 lb 8 oz (82.8 kg) 179 lb 9.6 oz (81.5 kg) 175 lb 8 oz (79.6 kg)    Physical  Exam    GEN: Well nourished, well developed, in no acute distress.  HEENT: Grossly normal.  Neck: Supple, no JVD, carotid bruits, or masses. Cardiac: RRR, no murmurs, rubs, or gallops. No clubbing, cyanosis, edema.  Radials/DP/PT 2+ and equal bilaterally. The right groin access site is unremarkable. No hematoma Respiratory:  Respirations regular and unlabored, clear to auscultation bilaterally. GI: Soft, nontender, nondistended, BS + x 4. MS: no deformity or atrophy. Skin: warm and dry, no rash. Neuro:  Strength and sensation are intact. Psych: AAOx3.  Normal affect.  Labs    CBC  Recent Labs  03/02/16 0624 03/03/16 0401  WBC 6.2 5.2  HGB 9.3* 8.3*  HCT 30.4* 26.2*  MCV 81.9 81.9  PLT 202 [id number]   Basic Metabolic Panel  Recent Labs  03/02/16 0624 03/03/16 0401  NA 141 141  K 4.1 4.4  CL 112* 113*  CO2 23 22  GLUCOSE 137* 159*  BUN 24* 19  CREATININE 1.41* 1.32*  CALCIUM 8.9 8.6*   Liver  Function Tests  Recent Labs  03/02/16 0624  AST 57*  ALT 34  ALKPHOS 74  BILITOT 0.9  PROT 6.1*  ALBUMIN 3.2*   No results for input(s): LIPASE, AMYLASE in the last 72 hours. Cardiac Enzymes  Recent Labs  03/01/16 2146 03/02/16 0323 03/02/16 0952  TROPONINI 1.07* 0.83* 0.48*   BNP Invalid input(s): POCBNP D-Dimer No results for input(s): DDIMER in the last 72 hours. Hemoglobin A1C No results for input(s): HGBA1C in the last 72 hours. Fasting Lipid Panel  Recent Labs  03/02/16 0323  CHOL 149  HDL 27*  LDLCALC 93  TRIG 143  CHOLHDL 5.5   Thyroid Function Tests No results for input(s): TSH, T4TOTAL, T3FREE, THYROIDAB in the last 72 hours.  Invalid input(s): FREET3  Telemetry    Sinus bradycardia - Personally Reviewed  ECG    No new data - Personally Reviewed  Radiology    Dg Chest 2 View  Result Date: 03/01/2016 CLINICAL DATA:  Left arm and chest pain. EXAM: CHEST  2 VIEW COMPARISON:  03/06/2011 FINDINGS: Mild elevation of the right  hemidiaphragm is stable. Mild enlargement of the cardiac silhouette is stable. The trachea is midline. The lungs are clear without airspace disease or pulmonary edema. No pleural effusions. IMPRESSION: No acute cardiopulmonary disease. Stable mild cardiomegaly. Electronically Signed   By: Markus Daft M.D.   On: 03/01/2016 16:08    Cardiac Studies   Cardiac cath and PCI: 03/02/16 Conclusion     Ost LAD to Mid LAD lesion, 0 %stenosed.  Dist RCA lesion, 40 %stenosed.  Mid RCA to Dist RCA lesion, 80 %stenosed.  Post intervention, there is a 0% residual stenosis.  A stent was successfully placed.  Ost RCA to Mid RCA lesion, 70 %stenosed.  Post intervention, there is a 0% residual stenosis.  A stent was successfully placed.     Patient Profile     82 year old gentleman with multivessel coronary disease and prior PCI admitted with unstable angina and treated with proximal and distal RCA DES stents on 03/02/16. He is asymptomatic.  Assessment & Plan    1. Unstable angina, successfully treated with DES to RCA 2. 2. Chronic kidney disease stage III. Creatinine is stable this morning compared to admission. 3. Anemia with drop in hemoglobin from 9.3 to 8.3 post procedure. This is likely dilutional. He will need a transition of care follow-up in one week. A BMET and CBC will need to be performed.  Signed, Sinclair Grooms, MD  03/03/2016, 8:13 AM

## 2016-03-03 NOTE — Telephone Encounter (Signed)
TCM Phone call .Marland Kitchen Appt on 03/13/16 at 8:30am w/ Kerin Ransom PA.. Thanks

## 2016-03-06 ENCOUNTER — Telehealth: Payer: Self-pay | Admitting: Cardiovascular Disease

## 2016-03-06 DIAGNOSIS — D500 Iron deficiency anemia secondary to blood loss (chronic): Secondary | ICD-10-CM

## 2016-03-06 NOTE — Telephone Encounter (Signed)
This needs to be addressed by DOD today

## 2016-03-06 NOTE — Telephone Encounter (Signed)
Patient called with report of dark, tarry stools.  Lab was closed at the time this was reported.  He reportedly denied lightheadedness or dizziness.  Patient has a history of ulcers and was recently started on aspirin and ticagrelor for NSTEMI and PCI. He is on iron supplementation.  Unclear whether this is due to iron or GIB.  Patient was advised to come tomorrow for CBC.  In the interim, if he develop chest pain, shortness of breath, lightheadedness or dizziness, he should be seen in the ED.  Zamier Eggebrecht C. Oval Linsey, MD, [facility] 03/06/2016

## 2016-03-06 NOTE — Telephone Encounter (Signed)
Spoke with Dr Oval Linsey she states for pt to have CBC done tomorrow the tarry stools is from the iron. CBC entered for lab draw tomorrow  Daughter notified she will bring him in tomorrow to see what CBC is

## 2016-03-06 NOTE — Telephone Encounter (Signed)
Patient/daughter contacted regarding discharge from [facility]  on 03/03/2016. Marland Kitchen  Patient understands to follow up with provider Kerin Ransom on 03-13-16 at 8:30 at [facility] office. Patient understands discharge instructions? yes Patient understands medications and regiment? yes Patient understands to bring all medications to this visit? Pt daughter states that they already planned to come to appt and bring medication list. Daughter encouraged to call back with any questions before visit.

## 2016-03-06 NOTE — Telephone Encounter (Signed)
Spoke with daughter states that pt has been having black, tarry stools and fatigue since Saturday. Pt is s/p cath procedure w/stent placement 03-02-16. Please advise

## 2016-03-06 NOTE — Telephone Encounter (Signed)
Pt had Cath on Thursday,started seeing blood in his stools on Saturday. He is still having it,please cal to advise.His blood count was already low before he went in the hospital,very concerned that it will drop lower.

## 2016-03-07 ENCOUNTER — Other Ambulatory Visit: Payer: Self-pay | Admitting: *Deleted

## 2016-03-07 LAB — COMPREHENSIVE METABOLIC PANEL
ALK PHOS: 82 U/L (ref 40–115)
ALT: 20 U/L (ref 9–46)
AST: 28 U/L (ref 10–35)
Albumin: 3.7 g/dL (ref 3.6–5.1)
BUN: 30 mg/dL — AB (ref 7–25)
CALCIUM: 9.6 mg/dL (ref 8.6–10.3)
CO2: 22 mmol/L (ref 20–31)
Chloride: 110 mmol/L (ref 98–110)
Creat: 1.65 mg/dL — ABNORMAL HIGH (ref 0.70–1.11)
Glucose, Bld: 130 mg/dL — ABNORMAL HIGH (ref 65–99)
POTASSIUM: 4.4 mmol/L (ref 3.5–5.3)
Sodium: 141 mmol/L (ref 135–146)
TOTAL PROTEIN: 6.2 g/dL (ref 6.1–8.1)
Total Bilirubin: 1.4 mg/dL — ABNORMAL HIGH (ref 0.2–1.2)

## 2016-03-07 MED ORDER — AMBULATORY NON FORMULARY MEDICATION: 90.0000 mg | Freq: Two times a day (BID) | Status: DC

## 2016-03-07 MED ORDER — AMBULATORY NON FORMULARY MEDICATION: 81.0000 mg | Freq: Every day | Status: DC

## 2016-03-13 ENCOUNTER — Ambulatory Visit (INDEPENDENT_AMBULATORY_CARE_PROVIDER_SITE_OTHER): Payer: Medicare Other | Admitting: Cardiology

## 2016-03-13 ENCOUNTER — Telehealth: Payer: Self-pay | Admitting: Cardiology

## 2016-03-13 ENCOUNTER — Encounter: Payer: Self-pay | Admitting: Cardiology

## 2016-03-13 VITALS — BP 144/75 | HR 49 | Ht 68.0 in | Wt 183.8 lb

## 2016-03-13 DIAGNOSIS — E1121 Type 2 diabetes mellitus with diabetic nephropathy: Secondary | ICD-10-CM

## 2016-03-13 DIAGNOSIS — E785 Hyperlipidemia, unspecified: Secondary | ICD-10-CM

## 2016-03-13 DIAGNOSIS — I2510 Atherosclerotic heart disease of native coronary artery without angina pectoris: Secondary | ICD-10-CM

## 2016-03-13 DIAGNOSIS — N183 Chronic kidney disease, stage 3 unspecified: Secondary | ICD-10-CM

## 2016-03-13 DIAGNOSIS — I10 Essential (primary) hypertension: Secondary | ICD-10-CM | POA: Diagnosis not present

## 2016-03-13 DIAGNOSIS — R001 Bradycardia, unspecified: Secondary | ICD-10-CM

## 2016-03-13 DIAGNOSIS — D500 Iron deficiency anemia secondary to blood loss (chronic): Secondary | ICD-10-CM

## 2016-03-13 DIAGNOSIS — Z9861 Coronary angioplasty status: Secondary | ICD-10-CM

## 2016-03-13 DIAGNOSIS — D638 Anemia in other chronic diseases classified elsewhere: Secondary | ICD-10-CM

## 2016-03-13 LAB — CBC
HCT: 25.8 % — ABNORMAL LOW (ref 38.5–50.0)
Hemoglobin: 8 g/dL — ABNORMAL LOW (ref 13.2–17.1)
MCH: 25.5 pg — ABNORMAL LOW (ref 27.0–33.0)
MCHC: 31 g/dL — ABNORMAL LOW (ref 32.0–36.0)
MCV: 82.2 fL (ref 80.0–100.0)
MPV: 8.8 fL (ref 7.5–12.5)
Platelets: 233 10*3/uL (ref 140–400)
RBC: 3.14 MIL/uL — ABNORMAL LOW (ref 4.20–5.80)
RDW: 16.9 % — ABNORMAL HIGH (ref 11.0–15.0)
WBC: 6.8 10*3/uL (ref 3.8–10.8)

## 2016-03-13 MED ORDER — METOPROLOL TARTRATE 25 MG PO TABS: ORAL_TABLET | ORAL | 1 refills | Status: DC

## 2016-03-13 NOTE — Assessment & Plan Note (Signed)
SCr 1.5  

## 2016-03-13 NOTE — Assessment & Plan Note (Signed)
Type 2 DM with CRI-3 

## 2016-03-13 NOTE — Assessment & Plan Note (Signed)
HR 48, I wonder if this is contributing to his fatigue. Will decrease Lopressor to 25 mg BID

## 2016-03-13 NOTE — Assessment & Plan Note (Signed)
B/P 142/82 laying, 104/80 satnding

## 2016-03-13 NOTE — Assessment & Plan Note (Signed)
MI PCI 1997 RCA and LAD DES 2012 RCA DES (2) 03/02/16

## 2016-03-13 NOTE — Telephone Encounter (Signed)
I called pt's daughter about his Hgb- 8.0. Discussed with Dr Stanford Breed, re check Wed and call Nevin [phone] with results.  Kerin Ransom PA-C 03/13/2016 1:34 PM

## 2016-03-13 NOTE — Progress Notes (Signed)
03/13/2016 Kevin Rhodes   25-Aug-1933  [id number]  Primary Physician [name], PA-C Primary Cardiologist: Dr Gwenlyn Found  HPI:  82 y/o male followed by Dr Gwenlyn Found with a history of CAD. He is /p remote MI and PCI to his RCA in 1997. In 2012 he had an LAD and RCA DES placed, (though these records are not in EPIC). His course in 0000000 was complicated by GI bleeding and PUD (Dr Henrene Pastor). He was recently admitted with a NSTEMI (Troponin 0.83) on 03/01/16.  He underwent cath and subsequent PCI to the RCA for 70% ISR and an 80% distal RCA stenosis. He is here today for TOC f/o. His Hgb on adm was 9.3- at discharge 8.3. Since discharge he has been weak until today. He has also noted dark stools. He denies any syncope or orthostatic symptoms. His last endoscopy was 12/21/15 and showed erosive gastritis but no active PUD.    Current Outpatient Prescriptions  Medication Sig Dispense Refill  . AMBULATORY NON FORMULARY MEDICATION Take 90 mg by mouth 2 (two) times daily. Medication Name: BRILINTA 90 mg BID (TWILIGHT Research Study PROVIDED)    . AMBULATORY NON FORMULARY MEDICATION Take 81 mg by mouth daily. Medication Name: Aspirin 81 mg daily (TWILIGHT Research Study PROVIDED)    . amLODipine (NORVASC) 5 MG tablet TAKE 1 TABLET (5 MG TOTAL) BY MOUTH DAILY. 90 tablet 0  . glimepiride (AMARYL) 2 MG tablet Take 2 mg by mouth daily.     . hydrochlorothiazide (MICROZIDE) 12.5 MG capsule TAKE ONE CAPSULE BY MOUTH EVERY DAY 90 capsule 2  . irbesartan (AVAPRO) 300 MG tablet TAKE 1 TABLET (300 MG TOTAL) BY MOUTH AT BEDTIME. 30 tablet 8  . isosorbide mononitrate (IMDUR) 30 MG 24 hr tablet TAKE 1/2 TABLET BY MOUTH EVERY DAY *KEEP APPT* 15 tablet 10  . metFORMIN (GLUCOPHAGE) 500 MG tablet Take 1,000 mg by mouth 2 (two) times daily with a meal.     . metoprolol tartrate (LOPRESSOR) 25 MG tablet TAKE 1 TABLET (50 MG TOTAL) BY MOUTH 2 (TWO) TIMES DAILY. 180 tablet 1  . nitroGLYCERIN (NITROSTAT) 0.4 MG SL tablet Place 1  tablet (0.4 mg total) under the tongue every 5 (five) minutes x 3 doses as needed for chest pain. 25 tablet 2  . omega-3 acid ethyl esters (LOVAZA) 1 G capsule Take 1 g by mouth 2 (two) times daily.     Marland Kitchen oxyCODONE-acetaminophen (PERCOCET) 5-325 MG per tablet Take 1-2 tablets by mouth every 4 (four) hours as needed. 20 tablet 0  . pantoprazole (PROTONIX) 40 MG tablet Take 1 tablet (40 mg total) by mouth 2 (two) times daily. Take 40mg .   twice daily.  30 minutes before breakfast and 30 minutes before evening meal. 60 tablet 11  . polysaccharide iron (NIFEREX) 150 MG CAPS capsule Take 150 mg by mouth 2 (two) times daily.      . pravastatin (PRAVACHOL) 40 MG tablet Take 0.5 tablets (20 mg total) by mouth at bedtime. 30 tablet 6  . timolol (TIMOPTIC) 0.5 % ophthalmic solution Place 1 drop into both eyes daily.     . traMADol (ULTRAM) 50 MG tablet Take 50 mg by mouth every 8 (eight) hours as needed (for pain).      No current facility-administered medications for this visit.     Allergies  Allergen Reactions  . Nsaids Other (See Comments)    Avoid due to h/o peptic ulcers  . Ambien [Zolpidem] Other (See Comments)  Mental changes    Social History   Social History  . Marital status: Widowed    Spouse name: N/A  . Number of children: 3  . Years of education: N/A   Occupational History  . retired    Social History Main Topics  . Smoking status: Former Smoker    Packs/day: 2.00    Years: 45.00    Types: Cigarettes    Quit date: 1997  . Smokeless tobacco: Never Used     Comment: "quit smoking cigarettes ~ 1997"  . Alcohol use No  . Drug use: No  . Sexual activity: No   Other Topics Concern  . Not on file   Social History Narrative  . No narrative on file     Review of Systems: General: negative for chills, fever, night sweats or weight changes.  Cardiovascular: negative for chest pain, dyspnea on exertion, edema, orthopnea, palpitations, paroxysmal nocturnal dyspnea or  shortness of breath Dermatological: negative for rash Respiratory: negative for cough or wheezing Urologic: negative for hematuria Abdominal: negative for nausea, vomiting, diarrhea, bright red blood per rectum, or hematemesis Neurologic: negative for visual changes, syncope, or dizziness All other systems reviewed and are otherwise negative except as noted above.    Blood pressure (!) 144/75, pulse (!) 49, height 5\' 8"  (1.727 m), weight 183 lb 12.8 oz (83.4 kg).  General appearance: alert, cooperative and no distress Neck: no JVD Lungs: clear to auscultation bilaterally Heart: regular rate and rhythm and short systolic murmur LSB Extremities: no edema Skin: Skin color, texture, turgor normal. No rashes or lesions Neurologic: Grossly normal  EKG NSR, SB 48, inferior Qs  ASSESSMENT AND PLAN:   CAD S/P multiple PCI MI PCI 1997 RCA and LAD DES 2012 RCA DES (2) 03/02/16  Anemia, chronic disease Pt appears to have some degree of chronic anemia, worse after PCI. He was mildly orthostatic in the office but asymptomatic, and in fact feeling better today. He has had some dark stools but he is on iron (no hemoccult available in the office, it has all been removed). Will check STAT CBC.   Essential hypertension B/P 142/82 laying, 104/80 satnding  Controlled type 2 diabetes with renal manifestation (HCC) Type 2 DM with CRI-3  Chronic kidney disease, stage III (moderate) SCr 1.5  Bradycardia Possibly contributing to fatigue, will decrease beta blocker  PLAN  Discussed with Dr Stanford Breed- check H/H Wed, if stable continue current Rx, if its dropping he may need transfusion and GI consult.   Kerin Ransom PA-C 03/13/2016 1:57 PM

## 2016-03-13 NOTE — Assessment & Plan Note (Signed)
Pt appears to have some degree of chronic anemia, worse after PCI. He was mildly orthostatic in the office but asymptomatic, and in fact feeling better today. He has had some dark stools but he is on iron (no hemoccult available in the office, it has all been removed). Will check STAT CBC.

## 2016-03-13 NOTE — Patient Instructions (Addendum)
Medication Instructions:  Your physician has recommended you make the following change in your medication:  1.  DECREASE the Lopressor to 25 mg taking 1 tablet twice a day   Labwork: TODAY:  STAT CBC  Testing/Procedures: None ordered  Follow-Up: Your physician recommends that you schedule a follow-up appointment in: 4-6 WEEKS WITH DR. Gwenlyn Found   Any Other Special Instructions Will Be Listed Below (If Applicable).    If you need a refill on your cardiac medications before your next appointment, please call your pharmacy.

## 2016-03-15 LAB — BASIC METABOLIC PANEL
BUN: 29 mg/dL — ABNORMAL HIGH (ref 7–25)
CO2: 22 mmol/L (ref 20–31)
Calcium: 8.5 mg/dL — ABNORMAL LOW (ref 8.6–10.3)
Chloride: 107 mmol/L (ref 98–110)
Creat: 1.78 mg/dL — ABNORMAL HIGH (ref 0.70–1.11)
Glucose, Bld: 229 mg/dL — ABNORMAL HIGH (ref 65–99)
Potassium: 4.5 mmol/L (ref 3.5–5.3)
Sodium: 140 mmol/L (ref 135–146)

## 2016-03-15 LAB — CBC
HCT: 25 % — ABNORMAL LOW (ref 38.5–50.0)
Hemoglobin: 7.7 g/dL — ABNORMAL LOW (ref 13.2–17.1)
MCH: 25.3 pg — ABNORMAL LOW (ref 27.0–33.0)
MCHC: 30.8 g/dL — ABNORMAL LOW (ref 32.0–36.0)
MCV: 82.2 fL (ref 80.0–100.0)
MPV: 8.8 fL (ref 7.5–12.5)
Platelets: 228 10*3/uL (ref 140–400)
RBC: 3.04 MIL/uL — ABNORMAL LOW (ref 4.20–5.80)
RDW: 16.8 % — ABNORMAL HIGH (ref 11.0–15.0)
WBC: 6 10*3/uL (ref 3.8–10.8)

## 2016-03-17 ENCOUNTER — Encounter (HOSPITAL_COMMUNITY): Payer: Self-pay | Admitting: Emergency Medicine

## 2016-03-17 ENCOUNTER — Observation Stay (HOSPITAL_COMMUNITY)
Admission: EM | Admit: 2016-03-17 | Discharge: 2016-03-18 | Disposition: A | Discharge status: Home / Self Care | Payer: Medicare Other | Attending: Cardiovascular Disease | Admitting: Cardiovascular Disease

## 2016-03-17 ENCOUNTER — Emergency Department (HOSPITAL_COMMUNITY): Payer: Medicare Other

## 2016-03-17 DIAGNOSIS — Z955 Presence of coronary angioplasty implant and graft: Secondary | ICD-10-CM | POA: Diagnosis not present

## 2016-03-17 DIAGNOSIS — I2510 Atherosclerotic heart disease of native coronary artery without angina pectoris: Secondary | ICD-10-CM

## 2016-03-17 DIAGNOSIS — N183 Chronic kidney disease, stage 3 unspecified: Secondary | ICD-10-CM | POA: Diagnosis present

## 2016-03-17 DIAGNOSIS — R531 Weakness: Secondary | ICD-10-CM | POA: Insufficient documentation

## 2016-03-17 DIAGNOSIS — Z7982 Long term (current) use of aspirin: Secondary | ICD-10-CM | POA: Diagnosis not present

## 2016-03-17 DIAGNOSIS — I1310 Hypertensive heart and chronic kidney disease without heart failure, with stage 1 through stage 4 chronic kidney disease, or unspecified chronic kidney disease: Secondary | ICD-10-CM | POA: Insufficient documentation

## 2016-03-17 DIAGNOSIS — Z9861 Coronary angioplasty status: Secondary | ICD-10-CM

## 2016-03-17 DIAGNOSIS — N179 Acute kidney failure, unspecified: Secondary | ICD-10-CM | POA: Diagnosis not present

## 2016-03-17 DIAGNOSIS — Z86010 Personal history of colonic polyps: Secondary | ICD-10-CM | POA: Insufficient documentation

## 2016-03-17 DIAGNOSIS — N170 Acute kidney failure with tubular necrosis: Secondary | ICD-10-CM | POA: Diagnosis not present

## 2016-03-17 DIAGNOSIS — Z7984 Long term (current) use of oral hypoglycemic drugs: Secondary | ICD-10-CM | POA: Insufficient documentation

## 2016-03-17 DIAGNOSIS — K210 Gastro-esophageal reflux disease with esophagitis: Secondary | ICD-10-CM | POA: Diagnosis not present

## 2016-03-17 DIAGNOSIS — I350 Nonrheumatic aortic (valve) stenosis: Secondary | ICD-10-CM

## 2016-03-17 DIAGNOSIS — I454 Nonspecific intraventricular block: Secondary | ICD-10-CM | POA: Insufficient documentation

## 2016-03-17 DIAGNOSIS — Z8711 Personal history of peptic ulcer disease: Secondary | ICD-10-CM | POA: Insufficient documentation

## 2016-03-17 DIAGNOSIS — R001 Bradycardia, unspecified: Secondary | ICD-10-CM | POA: Diagnosis not present

## 2016-03-17 DIAGNOSIS — E1122 Type 2 diabetes mellitus with diabetic chronic kidney disease: Secondary | ICD-10-CM | POA: Insufficient documentation

## 2016-03-17 DIAGNOSIS — I252 Old myocardial infarction: Secondary | ICD-10-CM | POA: Diagnosis not present

## 2016-03-17 DIAGNOSIS — Z87442 Personal history of urinary calculi: Secondary | ICD-10-CM | POA: Diagnosis not present

## 2016-03-17 DIAGNOSIS — D638 Anemia in other chronic diseases classified elsewhere: Secondary | ICD-10-CM | POA: Diagnosis present

## 2016-03-17 DIAGNOSIS — Z888 Allergy status to other drugs, medicaments and biological substances status: Secondary | ICD-10-CM | POA: Diagnosis not present

## 2016-03-17 DIAGNOSIS — E1129 Type 2 diabetes mellitus with other diabetic kidney complication: Secondary | ICD-10-CM | POA: Diagnosis present

## 2016-03-17 DIAGNOSIS — M810 Age-related osteoporosis without current pathological fracture: Secondary | ICD-10-CM | POA: Diagnosis not present

## 2016-03-17 DIAGNOSIS — I10 Essential (primary) hypertension: Secondary | ICD-10-CM | POA: Diagnosis present

## 2016-03-17 DIAGNOSIS — D508 Other iron deficiency anemias: Secondary | ICD-10-CM

## 2016-03-17 DIAGNOSIS — Z87891 Personal history of nicotine dependence: Secondary | ICD-10-CM | POA: Diagnosis not present

## 2016-03-17 DIAGNOSIS — E785 Hyperlipidemia, unspecified: Secondary | ICD-10-CM | POA: Diagnosis present

## 2016-03-17 LAB — COMPREHENSIVE METABOLIC PANEL
ALK PHOS: 130 U/L — AB (ref 38–126)
ALT: 45 U/L (ref 17–63)
ANION GAP: 9 (ref 5–15)
AST: 75 U/L — ABNORMAL HIGH (ref 15–41)
Albumin: 3.4 g/dL — ABNORMAL LOW (ref 3.5–5.0)
BUN: 20 mg/dL (ref 6–20)
CALCIUM: 9.1 mg/dL (ref 8.9–10.3)
CO2: 19 mmol/L — ABNORMAL LOW (ref 22–32)
CREATININE: 1.82 mg/dL — AB (ref 0.61–1.24)
Chloride: 110 mmol/L (ref 101–111)
GFR, EST AFRICAN AMERICAN: 39 mL/min — AB (ref >60)
GFR, EST NON AFRICAN AMERICAN: 33 mL/min — AB (ref >60)
Glucose, Bld: 177 mg/dL — ABNORMAL HIGH (ref 65–99)
Potassium: 4.3 mmol/L (ref 3.5–5.1)
Sodium: 138 mmol/L (ref 135–145)
TOTAL PROTEIN: 6.5 g/dL (ref 6.5–8.1)
Total Bilirubin: 1.1 mg/dL (ref 0.3–1.2)

## 2016-03-17 LAB — URINALYSIS, ROUTINE W REFLEX MICROSCOPIC
BILIRUBIN URINE: NEGATIVE
GLUCOSE, UA: NEGATIVE mg/dL
Hgb urine dipstick: NEGATIVE
KETONES UR: NEGATIVE mg/dL
Leukocytes, UA: NEGATIVE
NITRITE: NEGATIVE
PH: 5.5 (ref 5.0–8.0)
Protein, ur: NEGATIVE mg/dL
SPECIFIC GRAVITY, URINE: 1.019 (ref 1.005–1.030)

## 2016-03-17 LAB — CBC WITH DIFFERENTIAL/PLATELET
Basophils Absolute: 0 10*3/uL (ref 0.0–0.1)
Basophils Relative: 0 %
EOS ABS: 0.9 10*3/uL — AB (ref 0.0–0.7)
EOS PCT: 11 %
HCT: 27.3 % — ABNORMAL LOW (ref 39.0–52.0)
HEMOGLOBIN: 8.3 g/dL — AB (ref 13.0–17.0)
LYMPHS ABS: 1.4 10*3/uL (ref 0.7–4.0)
LYMPHS PCT: 17 %
MCH: 24.8 pg — AB (ref 26.0–34.0)
MCHC: 30.4 g/dL (ref 30.0–36.0)
MCV: 81.5 fL (ref 78.0–100.0)
MONOS PCT: 10 %
Monocytes Absolute: 0.9 10*3/uL (ref 0.1–1.0)
NEUTROS PCT: 62 %
Neutro Abs: 5.3 10*3/uL (ref 1.7–7.7)
Platelets: 258 10*3/uL (ref 150–400)
RBC: 3.35 MIL/uL — AB (ref 4.22–5.81)
RDW: 15.9 % — ABNORMAL HIGH (ref 11.5–15.5)
WBC: 8.6 10*3/uL (ref 4.0–10.5)

## 2016-03-17 LAB — PROTIME-INR
INR: 1.12
PROTHROMBIN TIME: 14.5 s (ref 11.4–15.2)

## 2016-03-17 LAB — TROPONIN I: TROPONIN I: 0.05 ng/mL — AB (ref <0.03)

## 2016-03-17 LAB — GLUCOSE, CAPILLARY: Glucose-Capillary: 177 mg/dL — ABNORMAL HIGH (ref 65–99)

## 2016-03-17 LAB — TSH: TSH: 0.53 u[IU]/mL (ref 0.350–4.500)

## 2016-03-17 LAB — PREPARE RBC (CROSSMATCH)

## 2016-03-17 LAB — CBG MONITORING, ED: Glucose-Capillary: 124 mg/dL — ABNORMAL HIGH (ref 65–99)

## 2016-03-17 LAB — BRAIN NATRIURETIC PEPTIDE: B Natriuretic Peptide: 123.1 pg/mL — ABNORMAL HIGH (ref 0.0–100.0)

## 2016-03-17 LAB — APTT: aPTT: 30 seconds (ref 24–36)

## 2016-03-17 MED ORDER — AMLODIPINE BESYLATE 5 MG PO TABS
5.0000 mg | ORAL_TABLET | Freq: Every day | ORAL | Status: DC
  Administered 2016-03-18: 5 mg via ORAL
  Filled 2016-03-17: qty 1

## 2016-03-17 MED ORDER — PANTOPRAZOLE SODIUM 40 MG PO TBEC
40.0000 mg | DELAYED_RELEASE_TABLET | Freq: Two times a day (BID) | ORAL | Status: DC
Start: 2016-03-17 — End: 2016-03-18
  Administered 2016-03-17 – 2016-03-18 (×2): 40 mg via ORAL
  Filled 2016-03-17 (×2): qty 1

## 2016-03-17 MED ORDER — SODIUM CHLORIDE 0.9 % IV SOLN: 250.0000 mL | INTRAVENOUS | Status: DC | PRN

## 2016-03-17 MED ORDER — PRAVASTATIN SODIUM 20 MG PO TABS
20.0000 mg | ORAL_TABLET | Freq: Every day | ORAL | Status: DC
  Administered 2016-03-17: 20 mg via ORAL
  Filled 2016-03-17: qty 1

## 2016-03-17 MED ORDER — ONDANSETRON HCL 4 MG/2ML IJ SOLN: 4.0000 mg | Freq: Four times a day (QID) | INTRAMUSCULAR | Status: DC | PRN

## 2016-03-17 MED ORDER — METFORMIN HCL 500 MG PO TABS
1000.0000 mg | ORAL_TABLET | Freq: Two times a day (BID) | ORAL | Status: DC
  Administered 2016-03-18: 1000 mg via ORAL
  Filled 2016-03-17: qty 2

## 2016-03-17 MED ORDER — HYDROCHLOROTHIAZIDE 12.5 MG PO CAPS
12.5000 mg | ORAL_CAPSULE | Freq: Every day | ORAL | Status: DC
  Administered 2016-03-18: 12.5 mg via ORAL
  Filled 2016-03-17: qty 1

## 2016-03-17 MED ORDER — SODIUM CHLORIDE 0.9% FLUSH
3.0000 mL | Freq: Two times a day (BID) | INTRAVENOUS | Status: DC
  Administered 2016-03-18: 3 mL via INTRAVENOUS

## 2016-03-17 MED ORDER — SODIUM CHLORIDE 0.9 % IV SOLN
Freq: Once | INTRAVENOUS | Status: AC
  Administered 2016-03-17: 22:00:00 via INTRAVENOUS

## 2016-03-17 MED ORDER — OMEGA-3-ACID ETHYL ESTERS 1 G PO CAPS
1.0000 g | ORAL_CAPSULE | Freq: Two times a day (BID) | ORAL | Status: DC
  Administered 2016-03-17 – 2016-03-18 (×2): 1 g via ORAL
  Filled 2016-03-17 (×2): qty 1

## 2016-03-17 MED ORDER — ACETAMINOPHEN 325 MG PO TABS: 650.0000 mg | ORAL_TABLET | ORAL | Status: DC | PRN

## 2016-03-17 MED ORDER — TIMOLOL MALEATE 0.5 % OP SOLN
1.0000 [drp] | Freq: Every day | OPHTHALMIC | Status: DC
  Administered 2016-03-18: 1 [drp] via OPHTHALMIC
  Filled 2016-03-17: qty 5

## 2016-03-17 MED ORDER — POLYSACCHARIDE IRON COMPLEX 150 MG PO CAPS
150.0000 mg | ORAL_CAPSULE | Freq: Two times a day (BID) | ORAL | Status: DC
  Administered 2016-03-17 – 2016-03-18 (×2): 150 mg via ORAL
  Filled 2016-03-17 (×2): qty 1

## 2016-03-17 MED ORDER — OXYCODONE-ACETAMINOPHEN 5-325 MG PO TABS
2.0000 | ORAL_TABLET | Freq: Once | ORAL | Status: AC
  Administered 2016-03-17: 2 via ORAL
  Filled 2016-03-17: qty 2

## 2016-03-17 MED ORDER — NITROGLYCERIN 0.4 MG SL SUBL: 0.4000 mg | SUBLINGUAL_TABLET | SUBLINGUAL | Status: DC | PRN

## 2016-03-17 MED ORDER — LORATADINE 10 MG PO TABS
10.0000 mg | ORAL_TABLET | Freq: Every day | ORAL | Status: DC
  Administered 2016-03-18: 10 mg via ORAL
  Filled 2016-03-17: qty 1

## 2016-03-17 MED ORDER — ASPIRIN EC 81 MG PO TBEC
81.0000 mg | DELAYED_RELEASE_TABLET | Freq: Every day | ORAL | Status: DC
  Administered 2016-03-18: 81 mg via ORAL
  Filled 2016-03-17: qty 1

## 2016-03-17 MED ORDER — FENTANYL CITRATE (PF) 100 MCG/2ML IJ SOLN
25.0000 ug | Freq: Once | INTRAMUSCULAR | Status: AC
Start: 2016-03-17 — End: 2016-03-17
  Administered 2016-03-17: 25 ug via INTRAVENOUS

## 2016-03-17 MED ORDER — FENTANYL CITRATE (PF) 100 MCG/2ML IJ SOLN
INTRAMUSCULAR | Status: AC
  Filled 2016-03-17: qty 2

## 2016-03-17 MED ORDER — SODIUM CHLORIDE 0.9% FLUSH: 3.0000 mL | INTRAVENOUS | Status: DC | PRN

## 2016-03-17 MED ORDER — ISOSORBIDE MONONITRATE ER 30 MG PO TB24
15.0000 mg | ORAL_TABLET | Freq: Every day | ORAL | Status: DC
  Administered 2016-03-18: 15 mg via ORAL
  Filled 2016-03-17: qty 1

## 2016-03-17 MED ORDER — TRAMADOL HCL 50 MG PO TABS
50.0000 mg | ORAL_TABLET | Freq: Four times a day (QID) | ORAL | Status: DC | PRN
  Administered 2016-03-18: 50 mg via ORAL
  Filled 2016-03-17: qty 1

## 2016-03-17 MED ORDER — GLIMEPIRIDE 2 MG PO TABS
2.0000 mg | ORAL_TABLET | Freq: Every day | ORAL | Status: DC
  Administered 2016-03-18: 2 mg via ORAL
  Filled 2016-03-17: qty 1

## 2016-03-17 NOTE — Progress Notes (Signed)
Patient admitted to 2W35 from ED, A&Ox4, VSS. Patient placed on tele and CCMD notified. Oriented to room and call light placed within reach. No questions or concerns at this time.

## 2016-03-17 NOTE — ED Triage Notes (Signed)
Pt. Stated, I just cant go , I m just so weak and I went to [facility] and then was sent here. My hgb has been around 8. Ive not been able to sleep either.

## 2016-03-17 NOTE — ED Notes (Signed)
Report called to 2w  

## 2016-03-17 NOTE — ED Notes (Signed)
Pt ambulated to RR .  

## 2016-03-17 NOTE — Progress Notes (Signed)
Patient lying in bed, daughters present at bedside. Call light within reach

## 2016-03-17 NOTE — ED Provider Notes (Signed)
[facility] DEPT Provider Note   CSN: [id number] Arrival date & time: 03/17/16  1451     History   Chief Complaint Chief Complaint  Patient presents with  . Weakness    HPI Kevin Rhodes is a 82 y.o. male.  HPI  The patient is an 82 year old male, he is a known history of diabetes as well as coronary disease, has had a remote myocardial infarction over 5 years ago and presented approximately 2 weeks ago with a non-ST elevation MI, at that time he had a slightly elevated troponin, underwent heart catheterization and was found to have in-stent restenosis as well as another obstructive lesion, both were stented with drug-eluting stents and the patient was discharged with Brilinta. In the past the patient has had an upper GI bleed from a peptic ulcer and he notes that after receiving the blood thinners he had dark tarry stools for 3 days. He has followed up with a cardiologist and during that visit was noted to have a hemoglobin of 8 but was otherwise doing well. Over the last 3 days he complains of increased generalized weakness, fatigue and states that he is having difficulty walking and doing other normal daily activities because of the generalized weakness. Upon presenting to the urgent care today he was found to have a bradycardia with a pulse in the mid 40s and was sent to the emergency department immediately. He does endorse having a slight cough but does not feel short of breath, does not have a headache, no blurred vision, no focal weakness or numbness, no nausea vomiting or diarrhea. His stools have normalized in color.  Past Medical History:  Diagnosis Date  . Allergic rhinitis   . Anemia   . Arthritis   . Benign neoplasm of colon   . Blood transfusion without reported diagnosis   . CAD (coronary artery disease)    a. DES to RCA and LAD in 2012  b. cath 02/2016: 70% ost RCA stenosis and 80% distal RCA stenosis, treated with placement of 2 Synergy DES  . Calculus of bile  duct without mention of cholecystitis or obstruction   . Depression    after wife passed away  . Diabetes mellitus    "pills"  . Diverticulosis of colon (without mention of hemorrhage)   . Esophageal reflux   . Hemorrhoids   . Hyperlipidemia   . Hypertension   . Internal hemorrhoids without mention of complication   . Kidney stones   . Myocardial infarction 09/1995  . NSTEMI (non-ST elevated myocardial infarction) ([place]) 03/02/2006  . Osteoporosis   . Other and unspecified hyperlipidemia   . Peptic ulcer, unspecified site, unspecified as acute or chronic, without mention of hemorrhage, perforation, or obstruction   . Pneumonia   . Shortness of breath 03/06/11    "w/exertion"  . Stricture and stenosis of esophagus   . Unspecified sinusitis (chronic)     Patient Active Problem List   Diagnosis Date Noted  . Mild aortic stenosis 03/17/2016  . Symptomatic bradycardia 03/17/2016  . Bradycardia 03/13/2016  . NSTEMI (non-ST elevated myocardial infarction) ([place]) 03/01/2016  . Controlled type 2 diabetes with renal manifestation ([place]) 03/01/2016  . Chronic kidney disease, stage III (moderate) 03/01/2016  . Acute coronary syndrome ([place]) 03/01/2016  . Dyslipidemia 03/24/2014  . Essential hypertension 04/07/2013  . Deficiency anemia 09/07/2011  . Right low back pain 07/06/2011  . Cirrhosis ([place]) 06/30/2011  . Inguinal hernia, bilateral 06/30/2011  . Anemia, chronic disease 03/06/2011  . COLONIC  POLYPS 07/12/2007  . CAD S/P multiple PCI 07/12/2007  . HEMORRHOIDS, INTERNAL 07/12/2007  . SINUSITIS 07/12/2007  . EROSIVE ESOPHAGITIS 07/12/2007  . ESOPHAGEAL STRICTURE 07/12/2007  . GERD 07/12/2007  . PEPTIC ULCER DISEASE 07/12/2007  . DIVERTICULOSIS, COLON 07/12/2007  . CHOLEDOCHOLITHIASIS 07/12/2007    Past Surgical History:  Procedure Laterality Date  . CARDIAC CATHETERIZATION N/A 03/02/2016   Procedure: Left Heart Cath and Coronary Angiography;  Surgeon: Lorretta Harp, MD;   Location: [facility];  Service: Cardiovascular;  Laterality: N/A;  . CARDIAC CATHETERIZATION N/A 03/02/2016   Procedure: Coronary Stent Intervention;  Surgeon: Lorretta Harp, MD;  Location: [facility];  Service: Cardiovascular;  Laterality: N/A;  . CATARACT EXTRACTION W/ INTRAOCULAR LENS  IMPLANT, BILATERAL Bilateral   . COLONOSCOPY  01/26/11   severe left diverticulosis, internal hemorrhoids  . CORONARY ANGIOPLASTY WITH STENT PLACEMENT  01/20/2011   "2"  . [place]   '1"  . CORONARY ANGIOPLASTY WITH STENT PLACEMENT  03/02/2016  . DOPPLER ECHOCARDIOGRAPHY  2012, 2006  . ESOPHAGOGASTRODUODENOSCOPY  01/26/11   esophageal stricture, antral ulcers - H. pylori bx negative  . stress myocardial perfusion  2011  . UPPER GASTROINTESTINAL ENDOSCOPY    . vascular uktrasound         Home Medications    Prior to Admission medications   Medication Sig Start Date End Date Taking? Authorizing Provider  AMBULATORY NON FORMULARY MEDICATION Take 90 mg by mouth 2 (two) times daily. Medication Name: BRILINTA 90 mg BID (TWILIGHT Research Study PROVIDED) 03/03/16  Yes Burnell Blanks, MD  AMBULATORY NON FORMULARY MEDICATION Take 81 mg by mouth daily. Medication Name: Aspirin 81 mg daily (TWILIGHT Research Study PROVIDED) Patient taking differently: Take 81 mg by mouth every morning. Medication Name: Aspirin 81 mg daily (TWILIGHT Research Study PROVIDED) 03/03/16  Yes Burnell Blanks, MD  amLODipine (NORVASC) 5 MG tablet TAKE 1 TABLET (5 MG TOTAL) BY MOUTH DAILY. 01/03/16  Yes Lorretta Harp, MD  glimepiride (AMARYL) 2 MG tablet Take 2 mg by mouth daily.  04/03/13  Yes Historical Provider, MD  hydrochlorothiazide (MICROZIDE) 12.5 MG capsule TAKE ONE CAPSULE BY MOUTH EVERY DAY Patient taking differently: Take 12.5 mg by mouth once a day 07/29/15  Yes Lorretta Harp, MD  irbesartan (AVAPRO) 300 MG tablet TAKE 1 TABLET (300 MG TOTAL) BY MOUTH  AT BEDTIME. 08/30/15  Yes Lorretta Harp, MD  isosorbide mononitrate (IMDUR) 30 MG 24 hr tablet TAKE 1/2 TABLET BY MOUTH EVERY DAY *KEEP APPT* Patient taking differently: Take 15 mg by mouth once a day 04/28/15  Yes Lorretta Harp, MD  metFORMIN (GLUCOPHAGE) 500 MG tablet Take 1,000 mg by mouth 2 (two) times daily with a meal.    Yes Historical Provider, MD  metoprolol tartrate (LOPRESSOR) 25 MG tablet TAKE 1 TABLET (50 MG TOTAL) BY MOUTH 2 (TWO) TIMES DAILY. Patient taking differently: Take 12.5 mg by mouth 2 (two) times daily.  03/13/16  Yes Erlene Quan, PA-C  omega-3 acid ethyl esters (LOVAZA) 1 G capsule Take 1 g by mouth 2 (two) times daily.    Yes Historical Provider, MD  pantoprazole (PROTONIX) 40 MG tablet Take 1 tablet (40 mg total) by mouth 2 (two) times daily. Take 40mg .   twice daily.  30 minutes before breakfast and 30 minutes before evening meal. Patient taking differently: Take 40 mg by mouth 2 (two) times daily.  12/21/15  Yes Docia Chuck  Henrene Pastor, MD  Phenylephrine-DM-GG-APAP [facility] SINUS-MAX) 5-10-200-325 MG CAPS Take 1 capsule by mouth 2 (two) times daily as needed (for symptoms).   Yes Historical Provider, MD  polysaccharide iron (NIFEREX) 150 MG CAPS capsule Take 150 mg by mouth 2 (two) times daily.     Yes Historical Provider, MD  pravastatin (PRAVACHOL) 40 MG tablet Take 0.5 tablets (20 mg total) by mouth at bedtime. 03/03/16  Yes Erma Heritage, PA  timolol (TIMOPTIC) 0.5 % ophthalmic solution Place 1 drop into both eyes daily.  02/12/14  Yes Historical Provider, MD  traMADol (ULTRAM) 50 MG tablet Take 50 mg by mouth every 8 (eight) hours as needed (for pain).  10/06/15  Yes Historical Provider, MD  nitroGLYCERIN (NITROSTAT) 0.4 MG SL tablet Place 1 tablet (0.4 mg total) under the tongue every 5 (five) minutes x 3 doses as needed for chest pain. Patient not taking: Reported on 03/17/2016 03/03/16   Erma Heritage, PA  oxyCODONE-acetaminophen (PERCOCET) 5-325 MG per tablet  Take 1-2 tablets by mouth every 4 (four) hours as needed. Patient not taking: Reported on 03/17/2016 09/16/14   Nat Christen, MD    Family History Family History  Problem Relation Age of Onset  . Diabetes Mother   . Heart disease Mother   . Colon cancer Neg Hx   . Esophageal cancer Neg Hx   . Rectal cancer Neg Hx   . Stomach cancer Neg Hx     Social History Social History  Substance Use Topics  . Smoking status: Former Smoker    Packs/day: 2.00    Years: 45.00    Types: Cigarettes    Quit date: 1997  . Smokeless tobacco: Never Used     Comment: "quit smoking cigarettes ~ 1997"  . Alcohol use No     Allergies   Nsaids and Ambien [zolpidem]   Review of Systems Review of Systems  All other systems reviewed and are negative.    Physical Exam Updated Vital Signs BP (!) 144/65   Pulse (!) 59   Temp 98.3 F (36.8 C) (Oral)   Resp 18   Ht 5\' 8"  (1.727 m)   Wt 183 lb (83 kg)   SpO2 100%   BMI 27.83 kg/m   Physical Exam  Constitutional: He appears well-developed and well-nourished. No distress.  HENT:  Head: Normocephalic and atraumatic.  Mouth/Throat: Oropharynx is clear and moist. No oropharyngeal exudate.  Eyes: Conjunctivae and EOM are normal. Pupils are equal, round, and reactive to light. Right eye exhibits no discharge. Left eye exhibits no discharge. No scleral icterus.  Neck: Normal range of motion. Neck supple. No JVD present. No thyromegaly present.  Cardiovascular: Regular rhythm, normal heart sounds and intact distal pulses.  Exam reveals no gallop and no friction rub.   No murmur heard. Bradycardia, strong pulses, rate ranges between 45 and 55, no JVD, scant lower extremity bilateral symmetrical edema  Pulmonary/Chest: Effort normal and breath sounds normal. No respiratory distress. He has no wheezes. He has no rales.  Abdominal: Soft. Bowel sounds are normal. He exhibits no distension and no mass. There is no tenderness.  Musculoskeletal: Normal range  of motion. He exhibits edema. He exhibits no tenderness.  Lymphadenopathy:    He has no cervical adenopathy.  Neurological: He is alert. Coordination normal.  The patient speaks in full sentences, he has no slurred speech, he has no focal weakness or numbness, he is able to move all 4 extremities with normal strength and coordination  Skin:  Skin is warm and dry. No rash noted. No erythema.  Psychiatric: He has a normal mood and affect. His behavior is normal.  Nursing note and vitals reviewed.    ED Treatments / Results  Labs (all labs ordered are listed, but only abnormal results are displayed) Labs Reviewed  URINALYSIS, ROUTINE W REFLEX MICROSCOPIC (NOT AT [facility]) - Abnormal; Notable for the following:       Result Value   Color, Urine AMBER (*)    All other components within normal limits  CBC WITH DIFFERENTIAL/PLATELET - Abnormal; Notable for the following:    RBC 3.35 (*)    Hemoglobin 8.3 (*)    HCT 27.3 (*)    MCH 24.8 (*)    RDW 15.9 (*)    Eosinophils Absolute 0.9 (*)    All other components within normal limits  COMPREHENSIVE METABOLIC PANEL - Abnormal; Notable for the following:    CO2 19 (*)    Glucose, Bld 177 (*)    Creatinine, Ser 1.82 (*)    Albumin 3.4 (*)    AST 75 (*)    Alkaline Phosphatase 130 (*)    GFR calc non Af Amer 33 (*)    GFR calc Af Amer 39 (*)    All other components within normal limits  TROPONIN I - Abnormal; Notable for the following:    Troponin I 0.05 (*)    All other components within normal limits  BRAIN NATRIURETIC PEPTIDE - Abnormal; Notable for the following:    B Natriuretic Peptide 123.1 (*)    All other components within normal limits  CBG MONITORING, ED - Abnormal; Notable for the following:    Glucose-Capillary 124 (*)    All other components within normal limits  PROTIME-INR  APTT  TSH  BASIC METABOLIC PANEL  CBC  TYPE AND SCREEN  PREPARE RBC (CROSSMATCH)    EKG  EKG Interpretation  Date/Time:  Friday March 17 2016 14:55:54 EST Ventricular Rate:  40 PR Interval:    QRS Duration: 126 QT Interval:  450 QTC Calculation: 366 R Axis:   23 Text Interpretation:  Undetermined rhythm Non-specific intra-ventricular conduction block Inferior infarct , age undetermined Cannot rule out Anterior infarct , age undetermined Abnormal ECG Since last tracing rate slower and PVC present and P waves not present before each QRS Confirmed by Sabra Heck  MD, Kaidyn Hernandes ([id number]) on 03/17/2016 3:15:38 PM Also confirmed by Sabra Heck  MD, [place] ([id number]), editor [place], Joelene Millin [phone])  on 03/17/2016 4:02:55 PM       Radiology Dg Chest Port 1 View  Result Date: 03/17/2016 CLINICAL DATA:  Weakness. EXAM: PORTABLE CHEST 1 VIEW COMPARISON:  Radiographs of March 01, 2016. FINDINGS: Stable cardiomegaly. No pneumothorax or pleural effusion is noted. No acute pulmonary disease is noted. Old left rib fractures are noted. IMPRESSION: No acute cardiopulmonary abnormality seen. Electronically Signed   By: Marijo Conception, M.D.   On: 03/17/2016 15:51    Procedures Procedures (including critical care time)  Medications Ordered in ED Medications  aspirin EC tablet 81 mg (not administered)  acetaminophen (TYLENOL) tablet 650 mg (not administered)  ondansetron (ZOFRAN) injection 4 mg (not administered)  sodium chloride flush (NS) 0.9 % injection 3 mL (not administered)  sodium chloride flush (NS) 0.9 % injection 3 mL (not administered)  0.9 %  sodium chloride infusion (not administered)  traMADol (ULTRAM) tablet 50 mg (not administered)  loratadine (CLARITIN) tablet 10 mg (not administered)  oxyCODONE-acetaminophen (PERCOCET/ROXICET) 5-325 MG per tablet 2 tablet (  not administered)  amLODipine (NORVASC) tablet 5 mg (not administered)  glimepiride (AMARYL) tablet 2 mg (not administered)  hydrochlorothiazide (MICROZIDE) capsule 12.5 mg (not administered)  isosorbide mononitrate (IMDUR) 24 hr tablet 15 mg (not administered)  metFORMIN  (GLUCOPHAGE) tablet 1,000 mg (not administered)  nitroGLYCERIN (NITROSTAT) SL tablet 0.4 mg (not administered)  omega-3 acid ethyl esters (LOVAZA) capsule 1 g (not administered)  pantoprazole (PROTONIX) EC tablet 40 mg (not administered)  iron polysaccharides (NIFEREX) capsule 150 mg (not administered)  pravastatin (PRAVACHOL) tablet 20 mg (not administered)  timolol (TIMOPTIC) 0.5 % ophthalmic solution 1 drop (not administered)  0.9 %  sodium chloride infusion (not administered)  fentaNYL (SUBLIMAZE) injection 25 mcg (25 mcg Intravenous Given 03/17/16 1612)     Initial Impression / Assessment and Plan / ED Course  I have reviewed the triage vital signs and the nursing notes.  Pertinent labs & imaging results that were available during my care of the patient were reviewed by me and considered in my medical decision making (see chart for details).  Clinical Course    The patient's EKG shows bradycardia, there is not a clear sinus rhythm here has some beats at P waves and some do not, there is a PVC, his blood pressure is in a safe range, he is afebrile. He will need further workup to include potential diagnoses including cardiac disease, worsening anemia, worsening electrolyte dysfunction. There is no focal neurologic symptoms to suggest this is a lateralizing lesion from an ischemic stroke  Labs show Hgb is up to 8.3, platelets 258, WBC 8.6 Cr 1.82 (c/w prior), BUN 20, Glucose 177,  Trop 0.05, BNP 123,  CXR without acute findings.  Cardiology has admitted pt.    Final Clinical Impressions(s) / ED Diagnoses   Final diagnoses:  Bradycardia    New Prescriptions Current Discharge Medication List       Noemi Chapel, MD 03/17/16 1902

## 2016-03-17 NOTE — H&P (Signed)
Patient ID: Kevin Rhodes MRN: [id number], DOB/AGE: 06-16-1933   Admit date: 03/17/2016   Primary Physician: Kevin Rough, PA-C Primary Cardiologist: Dr Kevin Rhodes  HPI: 81 y/o male followed by Dr Kevin Rhodes with a history of CAD. He is /p remote MI and PCI to his RCA in 1997. In 2012 he had an LAD and RCA DES placed, (though these records are not in EPIC). His course in 0000000 was complicated by GI bleeding requiring transfusion (Dr Kevin Rhodes). He was recently admitted with a NSTEMI (Troponin 0.83) on 03/01/16.  He underwent cath and subsequent PCI to the RCA for 70% ISR and an 80% distal RCA stenosis. He was seen in the office  for TOC f/u on 03/13/16. His Hgb on adm was 9.3- at discharge 8.3. Since discharge he has complained of being weak. He had also noted dark stools though he is on iron and he says this has resolved. He denies any syncope or orthostatic symptoms. His last endoscopy was 12/21/15 and showed erosive gastritis but no active PUD. When I saw him in the office 03/13/16 I checked a STAT CBC (came back Hgb 8.0), and cut back his Lopressor from 50 mg BID to 25 mg BID as his HR was 50.   Since then he says he has developed a "head cold". He says he has been so weak he can't even go outside. He went to an Urgent Care and was noted to have a HR of 40 and was sent here. He denies any chest pain or dyspnea.    Problem List: Past Medical History:  Diagnosis Date  . Allergic rhinitis   . Anemia   . Arthritis   . Benign neoplasm of colon   . Blood transfusion without reported diagnosis   . CAD (coronary artery disease)    a. DES to RCA and LAD in 2012  b. cath 02/2016: 70% ost RCA stenosis and 80% distal RCA stenosis, treated with placement of 2 Synergy DES  . Calculus of bile duct without mention of cholecystitis or obstruction   . Depression    after wife passed away  . Diabetes mellitus    "pills"  . Diverticulosis of colon (without mention of hemorrhage)   . Esophageal reflux   .  Hemorrhoids   . Hyperlipidemia   . Hypertension   . Internal hemorrhoids without mention of complication   . Kidney stones   . Myocardial infarction 09/1995  . NSTEMI (non-ST elevated myocardial infarction) ([place]) 03/02/2006  . Osteoporosis   . Other and unspecified hyperlipidemia   . Peptic ulcer, unspecified site, unspecified as acute or chronic, without mention of hemorrhage, perforation, or obstruction   . Pneumonia   . Shortness of breath 03/06/11    "w/exertion"  . Stricture and stenosis of esophagus   . Unspecified sinusitis (chronic)     Past Surgical History:  Procedure Laterality Date  . CARDIAC CATHETERIZATION N/A 03/02/2016   Procedure: Left Heart Cath and Coronary Angiography;  Surgeon: Lorretta Harp, MD;  Location: [facility];  Service: Cardiovascular;  Laterality: N/A;  . CARDIAC CATHETERIZATION N/A 03/02/2016   Procedure: Coronary Stent Intervention;  Surgeon: Lorretta Harp, MD;  Location: [facility];  Service: Cardiovascular;  Laterality: N/A;  . CATARACT EXTRACTION W/ INTRAOCULAR LENS  IMPLANT, BILATERAL Bilateral   . COLONOSCOPY  01/26/11   severe left diverticulosis, internal hemorrhoids  . CORONARY ANGIOPLASTY WITH STENT PLACEMENT  01/20/2011   "2"  . CORONARY ANGIOPLASTY WITH  [place]   '1"  . CORONARY ANGIOPLASTY WITH STENT PLACEMENT  03/02/2016  . DOPPLER ECHOCARDIOGRAPHY  2012, 2006  . ESOPHAGOGASTRODUODENOSCOPY  01/26/11   esophageal stricture, antral ulcers - H. pylori bx negative  . stress myocardial perfusion  2011  . UPPER GASTROINTESTINAL ENDOSCOPY    . vascular uktrasound       Allergies:  Allergies  Allergen Reactions  . Nsaids Other (See Comments)    Avoid due to h/o peptic ulcers  . Ambien [Zolpidem] Other (See Comments)    Mental changes     Home Medications Prior to Admission medications   Medication Sig Start Date End Date Taking? Authorizing Provider  AMBULATORY NON FORMULARY MEDICATION Take 90 mg by  mouth 2 (two) times daily. Medication Name: BRILINTA 90 mg BID (TWILIGHT Research Study PROVIDED) 03/03/16  Yes Burnell Blanks, MD  AMBULATORY NON FORMULARY MEDICATION Take 81 mg by mouth daily. Medication Name: Aspirin 81 mg daily (TWILIGHT Research Study PROVIDED) Patient taking differently: Take 81 mg by mouth every morning. Medication Name: Aspirin 81 mg daily (TWILIGHT Research Study PROVIDED) 03/03/16  Yes Burnell Blanks, MD  amLODipine (NORVASC) 5 MG tablet TAKE 1 TABLET (5 MG TOTAL) BY MOUTH DAILY. 01/03/16  Yes Lorretta Harp, MD  glimepiride (AMARYL) 2 MG tablet Take 2 mg by mouth daily.  04/03/13  Yes Historical Provider, MD  hydrochlorothiazide (MICROZIDE) 12.5 MG capsule TAKE ONE CAPSULE BY MOUTH EVERY DAY Patient taking differently: Take 12.5 mg by mouth once a day 07/29/15  Yes Lorretta Harp, MD  irbesartan (AVAPRO) 300 MG tablet TAKE 1 TABLET (300 MG TOTAL) BY MOUTH AT BEDTIME. 08/30/15  Yes Lorretta Harp, MD  isosorbide mononitrate (IMDUR) 30 MG 24 hr tablet TAKE 1/2 TABLET BY MOUTH EVERY DAY *KEEP APPT* Patient taking differently: Take 15 mg by mouth once a day 04/28/15  Yes Lorretta Harp, MD  metFORMIN (GLUCOPHAGE) 500 MG tablet Take 1,000 mg by mouth 2 (two) times daily with a meal.    Yes Historical Provider, MD  metoprolol tartrate (LOPRESSOR) 25 MG tablet TAKE 1 TABLET (50 MG TOTAL) BY MOUTH 2 (TWO) TIMES DAILY. Patient taking differently: Take 12.5 mg by mouth 2 (two) times daily.  03/13/16  Yes Erlene Quan, PA-C  omega-3 acid ethyl esters (LOVAZA) 1 G capsule Take 1 g by mouth 2 (two) times daily.    Yes Historical Provider, MD  pantoprazole (PROTONIX) 40 MG tablet Take 1 tablet (40 mg total) by mouth 2 (two) times daily. Take 40mg .   twice daily.  30 minutes before breakfast and 30 minutes before evening meal. Patient taking differently: Take 40 mg by mouth 2 (two) times daily.  12/21/15  Yes Irene Shipper, MD  Phenylephrine-DM-GG-APAP [facility] SINUS-MAX)  5-10-200-325 MG CAPS Take 1 capsule by mouth 2 (two) times daily as needed (for symptoms).   Yes Historical Provider, MD  polysaccharide iron (NIFEREX) 150 MG CAPS capsule Take 150 mg by mouth 2 (two) times daily.     Yes Historical Provider, MD  pravastatin (PRAVACHOL) 40 MG tablet Take 0.5 tablets (20 mg total) by mouth at bedtime. 03/03/16  Yes Erma Heritage, PA  timolol (TIMOPTIC) 0.5 % ophthalmic solution Place 1 drop into both eyes daily.  02/12/14  Yes Historical Provider, MD  traMADol (ULTRAM) 50 MG tablet Take 50 mg by mouth every 8 (eight) hours as needed (for pain).  10/06/15  Yes Historical Provider, MD  nitroGLYCERIN (NITROSTAT) 0.4 MG SL  tablet Place 1 tablet (0.4 mg total) under the tongue every 5 (five) minutes x 3 doses as needed for chest pain. Patient not taking: Reported on 03/17/2016 03/03/16   Erma Heritage, PA  oxyCODONE-acetaminophen (PERCOCET) 5-325 MG per tablet Take 1-2 tablets by mouth every 4 (four) hours as needed. Patient not taking: Reported on 03/17/2016 09/16/14   Nat Christen, MD     Family History  Problem Relation Age of Onset  . Diabetes Mother   . Heart disease Mother   . Colon cancer Neg Hx   . Esophageal cancer Neg Hx   . Rectal cancer Neg Hx   . Stomach cancer Neg Hx      Social History   Social History  . Marital status: Widowed    Spouse name: N/A  . Number of children: 3  . Years of education: N/A   Occupational History  . retired    Social History Main Topics  . Smoking status: Former Smoker    Packs/day: 2.00    Years: 45.00    Types: Cigarettes    Quit date: 1997  . Smokeless tobacco: Never Used     Comment: "quit smoking cigarettes ~ 1997"  . Alcohol use No  . Drug use: No  . Sexual activity: No   Other Topics Concern  . Not on file   Social History Narrative  . No narrative on file     Review of Systems: General: negative for chills, fever, night sweats or weight changes.  Cardiovascular: negative for chest  pain, edema, orthopnea, palpitations, paroxysmal nocturnal dyspnea or shortness of breath HEENT: negative for any visual disturbances, blindness, glaucoma Dermatological: negative for rash Respiratory: negative for cough, hemoptysis, or wheezing Urologic: negative for hematuria or dysuria Abdominal: negative for nausea, vomiting, diarrhea, bright red blood per rectum, or hematemesis Neurologic: negative for visual changes, syncope, or dizziness Musculoskeletal: negative for back pain, joint pain, or swelling Psych: cooperative and appropriate He has had bilateral leg pain at night All other systems reviewed and are otherwise negative except as noted above.  Physical Exam: Blood pressure 120/69, pulse (!) 54, temperature 98.4 F (36.9 C), temperature source Oral, resp. rate 18, height 5\' 8"  (1.727 m), weight 183 lb (83 kg), SpO2 100 %.  General appearance: alert, cooperative and no distress Neck: no JVD and transmitted murmur Lungs: clear to auscultation bilaterally Heart: regular rate and rhythm and 2/6 systolic murmur LSB, AOV, preserved S2 Abdomen: soft, non-tender; bowel sounds normal; no masses,  no organomegaly Extremities: extremities normal, atraumatic, no cyanosis or edema Pulses: 2+ and symmetric Skin: pale, cool, dry Neurologic: Grossly normal    Labs:   Results for orders placed or performed during the hospital encounter of 03/17/16 (from the past 24 hour(s))  CBC with Differential/Platelet     Status: Abnormal   Collection Time: 03/17/16  3:13 PM  Result Value Ref Range   WBC 8.6 4.0 - 10.5 K/uL   RBC 3.35 (L) 4.22 - 5.81 MIL/uL   Hemoglobin 8.3 (L) 13.0 - 17.0 g/dL   HCT 27.3 (L) 39.0 - 52.0 %   MCV 81.5 78.0 - 100.0 fL   MCH 24.8 (L) 26.0 - 34.0 pg   MCHC 30.4 30.0 - 36.0 g/dL   RDW 15.9 (H) 11.5 - 15.5 %   Platelets 258 150 - 400 K/uL   Neutrophils Relative % 62 %   Neutro Abs 5.3 1.7 - 7.7 K/uL   Lymphocytes Relative 17 %   Lymphs Abs 1.4  0.7 - 4.0 K/uL    Monocytes Relative 10 %   Monocytes Absolute 0.9 0.1 - 1.0 K/uL   Eosinophils Relative 11 %   Eosinophils Absolute 0.9 (H) 0.0 - 0.7 K/uL   Basophils Relative 0 %   Basophils Absolute 0.0 0.0 - 0.1 K/uL  Comprehensive metabolic panel     Status: Abnormal   Collection Time: 03/17/16  3:13 PM  Result Value Ref Range   Sodium 138 135 - 145 mmol/L   Potassium 4.3 3.5 - 5.1 mmol/L   Chloride 110 101 - 111 mmol/L   CO2 19 (L) 22 - 32 mmol/L   Glucose, Bld 177 (H) 65 - 99 mg/dL   BUN 20 6 - 20 mg/dL   Creatinine, Ser 1.82 (H) 0.61 - 1.24 mg/dL   Calcium 9.1 8.9 - 10.3 mg/dL   Total Protein 6.5 6.5 - 8.1 g/dL   Albumin 3.4 (L) 3.5 - 5.0 g/dL   AST 75 (H) 15 - 41 U/L   ALT 45 17 - 63 U/L   Alkaline Phosphatase 130 (H) 38 - 126 U/L   Total Bilirubin 1.1 0.3 - 1.2 mg/dL   GFR calc non Af Amer 33 (L) >60 mL/min   GFR calc Af Amer 39 (L) >60 mL/min   Anion gap 9 5 - 15  Troponin I     Status: Abnormal   Collection Time: 03/17/16  3:13 PM  Result Value Ref Range   Troponin I 0.05 (HH) <0.03 ng/mL  Protime-INR     Status: None   Collection Time: 03/17/16  3:13 PM  Result Value Ref Range   Prothrombin Time 14.5 11.4 - 15.2 seconds   INR 1.12   APTT     Status: None   Collection Time: 03/17/16  3:13 PM  Result Value Ref Range   aPTT 30 24 - 36 seconds  Brain natriuretic peptide     Status: Abnormal   Collection Time: 03/17/16  3:13 PM  Result Value Ref Range   B Natriuretic Peptide 123.1 (H) 0.0 - 100.0 pg/mL     Radiology/Studies: Dg Chest 2 View  Result Date: 03/01/2016 CLINICAL DATA:  Left arm and chest pain. EXAM: CHEST  2 VIEW COMPARISON:  03/06/2011 FINDINGS: Mild elevation of the right hemidiaphragm is stable. Mild enlargement of the cardiac silhouette is stable. The trachea is midline. The lungs are clear without airspace disease or pulmonary edema. No pleural effusions. IMPRESSION: No acute cardiopulmonary disease. Stable mild cardiomegaly. Electronically Signed   By: Markus Daft M.D.   On: 03/01/2016 16:08   Dg Chest Port 1 View  Result Date: 03/17/2016 CLINICAL DATA:  Weakness. EXAM: PORTABLE CHEST 1 VIEW COMPARISON:  Radiographs of March 01, 2016. FINDINGS: Stable cardiomegaly. No pneumothorax or pleural effusion is noted. No acute pulmonary disease is noted. Old left rib fractures are noted. IMPRESSION: No acute cardiopulmonary abnormality seen. Electronically Signed   By: Marijo Conception, M.D.   On: 03/17/2016 15:51    EKG:NSR, SB-40. One PVC, narrow QRS  ASSESSMENT AND PLAN:  Principal Problem:   Bradycardia Active Problems:   CAD S/P multiple PCI   Anemia, chronic disease   Essential hypertension   Dyslipidemia   Controlled type 2 diabetes with renal manifestation (HCC)   Chronic kidney disease, stage III (moderate)   Mild aortic stenosis  PLAN: Admit for observation. Stop Lopressor. Check stools and f/u Hgb in am though his Hgb in ED appears stable at 8.3. Tylenol or Ultram prn for  leg pain- no NSAIDS with history of CRI and GI bleed 2012.   Angelena Form, PA-C 03/17/2016, 5:19 PM [phone]  The patient was seen, examined and discussed with Kerin Ransom, PA-C and I agree with the above.   82 y/o male followed by Dr Kevin Rhodes with a history of CAD. He is /p remote MI and PCI to his RCA in 1997. In 2012 he had an LAD and RCA DES placed, (though these records are not in EPIC). His course in 0000000 was complicated by GI bleeding requiring transfusion (Dr Kevin Rhodes). He was recently admitted with a NSTEMI (Troponin 0.83) on 03/01/16.  He underwent cath and subsequent PCI to the RCA for 70% ISR and an 80% distal RCA stenosis. He was seen in the office  for TOC f/u on 03/13/16. His Hgb on adm was 9.3- at discharge 8.3. Since discharge he has complained of being weak. He had also noted dark stools though he is on iron and he says this has resolved. He has been feeling swimmy headed, unable to perform basic activities of daily living.  He is Rhodes to be  bradycardic with escape junctional rhythm with ventricular rate of 40 BPM nd Hb 8.3. Crea 1.8 from 1.3.  We will hold metoprolol for symptomatic bradycardia.  We will give 2 units of PRBC and follow H&H and crea closely.  Elevated troponin sec to demand ischemia with acute on chronic kidney failure and anemia.   Ena Dawley, MD 03/17/2016

## 2016-03-18 ENCOUNTER — Other Ambulatory Visit: Payer: Self-pay | Admitting: Cardiology

## 2016-03-18 DIAGNOSIS — D649 Anemia, unspecified: Secondary | ICD-10-CM

## 2016-03-18 DIAGNOSIS — R001 Bradycardia, unspecified: Secondary | ICD-10-CM | POA: Diagnosis not present

## 2016-03-18 DIAGNOSIS — R7989 Other specified abnormal findings of blood chemistry: Secondary | ICD-10-CM

## 2016-03-18 LAB — BASIC METABOLIC PANEL
Anion gap: 6 (ref 5–15)
BUN: 21 mg/dL — ABNORMAL HIGH (ref 6–20)
CO2: 21 mmol/L — ABNORMAL LOW (ref 22–32)
Calcium: 8.3 mg/dL — ABNORMAL LOW (ref 8.9–10.3)
Chloride: 111 mmol/L (ref 101–111)
Creatinine, Ser: 1.55 mg/dL — ABNORMAL HIGH (ref 0.61–1.24)
GFR calc Af Amer: 47 mL/min — ABNORMAL LOW (ref >60)
GFR calc non Af Amer: 41 mL/min — ABNORMAL LOW (ref >60)
Glucose, Bld: 140 mg/dL — ABNORMAL HIGH (ref 65–99)
Potassium: 4.4 mmol/L (ref 3.5–5.1)
Sodium: 138 mmol/L (ref 135–145)

## 2016-03-18 LAB — GLUCOSE, CAPILLARY
Glucose-Capillary: 138 mg/dL — ABNORMAL HIGH (ref 65–99)
Glucose-Capillary: 153 mg/dL — ABNORMAL HIGH (ref 65–99)
Glucose-Capillary: 229 mg/dL — ABNORMAL HIGH (ref 65–99)
Glucose-Capillary: 201 mg/dL — ABNORMAL HIGH (ref 65–99)

## 2016-03-18 LAB — CBC
HCT: 26.4 % — ABNORMAL LOW (ref 39.0–52.0)
Hemoglobin: 8.4 g/dL — ABNORMAL LOW (ref 13.0–17.0)
MCH: 25.7 pg — ABNORMAL LOW (ref 26.0–34.0)
MCHC: 31.8 g/dL (ref 30.0–36.0)
MCV: 80.7 fL (ref 78.0–100.0)
Platelets: 192 10*3/uL (ref 150–400)
RBC: 3.27 MIL/uL — ABNORMAL LOW (ref 4.22–5.81)
RDW: 15.4 % (ref 11.5–15.5)
WBC: 6.7 10*3/uL (ref 4.0–10.5)

## 2016-03-18 MED ORDER — POLYETHYLENE GLYCOL 3350 17 G PO PACK
17.0000 g | PACK | Freq: Once | ORAL | Status: AC
  Administered 2016-03-18: 17 g via ORAL
  Filled 2016-03-18: qty 1

## 2016-03-18 NOTE — Progress Notes (Signed)
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 His Lopressor was held and he is returned to sinus rhythm from junctional rhythm.  Inpatient Medications    Scheduled Meds: . amLODipine  5 mg Oral Daily  . aspirin EC  81 mg Oral Daily  . glimepiride  2 mg Oral Daily  . hydrochlorothiazide  12.5 mg Oral Daily  . iron polysaccharides  150 mg Oral BID  . isosorbide mononitrate  15 mg Oral Daily  . loratadine  10 mg Oral Daily  . metFORMIN  1,000 mg Oral BID WC  . omega-3 acid ethyl esters  1 g Oral BID  . pantoprazole  40 mg Oral BID  . pravastatin  20 mg Oral QHS  . sodium chloride flush  3 mL Intravenous Q12H  . timolol  1 drop Both Eyes Daily   Continuous Infusions:  PRN Meds: sodium chloride, acetaminophen, nitroGLYCERIN, ondansetron (ZOFRAN) IV, sodium chloride flush, traMADol   Vital Signs    Vitals:   03/17/16 2229 03/18/16 0105 03/18/16 0147 03/18/16 0415  BP: 104/69 (!) 117/57 (!) 131/58 (!) 131/58  Pulse: (!) 59 (!) 59 60 60  Resp: 16 16 16 16   Temp: 98.4 F (36.9 C) 98 F (36.7 C) 98.1 F (36.7 C) 98.1 F (36.7 C)  TempSrc: Oral Oral Oral Oral  SpO2: 100% 99% 99% 98%  Weight:    185 lb 14.4 oz (84.3 kg)  Height:        Intake/Output Summary (Last 24 hours) at 03/18/16 0826 Last data filed at 03/18/16 0415  Gross per 24 hour  Intake              365 ml  Output              200 ml  Net              165 ml   Filed Weights   03/17/16 1459 03/18/16 0415  Weight: 183 lb (83 kg) 185 lb 14.4 oz (84.3  kg)    Physical Exam    GEN: Well nourished, well developed, in no acute distress.  HEENT: Grossly normal.  Neck: Supple, no JVD, carotid bruits, or masses. Cardiac: RRR, 2/6 systolic murmur at the base, rubs, or gallops. No clubbing, cyanosis, edema.  Radials/DP/PT 2+ and equal bilaterally.  Respiratory:  Respirations regular and unlabored, clear to auscultation bilaterally. GI: Soft, nontender, nondistended, BS + x 4. MS: no deformity or atrophy. Skin: warm and dry, no rash. Neuro:  Strength and sensation are intact. Psych: AAOx3.  Normal affect.  Labs    CBC  Recent Labs  03/17/16 1513 03/18/16 0554  WBC 8.6 6.7  NEUTROABS 5.3  --   HGB 8.3* 8.4*  HCT 27.3* 26.4*  MCV 81.5 80.7  PLT 258 [id number]   Basic Metabolic Panel  Recent Labs  03/17/16 1513 03/18/16 0554  NA 138 138  K 4.3 4.4  CL 110 111  CO2 19* 21*  GLUCOSE 177* 140*  BUN 20 21*  CREATININE 1.82* 1.55*  CALCIUM 9.1 8.3*   Liver Function Tests  Recent Labs  03/17/16 1513  AST 75*  ALT 45  ALKPHOS 130*  BILITOT 1.1  PROT 6.5  ALBUMIN 3.4*   No results for input(s): LIPASE, AMYLASE in the last 72 hours. Cardiac Enzymes  Recent Labs  03/17/16 1513  TROPONINI 0.05*   BNP Invalid input(s): POCBNP D-Dimer No results for input(s): DDIMER in the last 72 hours. Hemoglobin A1C No results for input(s): HGBA1C in the last 72 hours. Fasting Lipid Panel No results for input(s): CHOL, HDL, LDLCALC, TRIG, CHOLHDL, LDLDIRECT in the last 72 hours. Thyroid Function Tests  Recent Labs  03/17/16 1726  TSH 0.530    Telemetry    Sinus rhythm - Personally Reviewed  ECG    Junctional rhythm, rate 40 - Personally Reviewed  Radiology    Dg Chest Port 1 View  Result Date: 03/17/2016 CLINICAL DATA:  Weakness. EXAM: PORTABLE CHEST 1 VIEW COMPARISON:  Radiographs of March 01, 2016. FINDINGS: Stable cardiomegaly. No pneumothorax or pleural effusion is noted. No acute pulmonary disease is noted.  Old left rib fractures are noted. IMPRESSION: No acute cardiopulmonary abnormality seen. Electronically Signed   By: Marijo Conception, M.D.   On: 03/17/2016 15:51    Cardiac Studies   TTE - Left ventricle: Inferior wall hypokinesis The cavity size was   mildly dilated. Wall thickness was increased in a pattern of mild   LVH. Systolic function was normal. The estimated ejection   fraction was in the range of 50% to 55%. Wall motion was normal;   there were no regional wall motion abnormalities. Left   ventricular diastolic function parameters were normal. - Aortic valve: There was mild stenosis. - Mitral valve: Calcified annulus. Mildly thickened leaflets . - Left atrium: The atrium was moderately dilated. - Atrial septum: No defect or patent foramen ovale was identified.  Cath  Ost LAD to Mid LAD lesion, 0 %stenosed.  Dist RCA lesion, 40 %stenosed.  Mid RCA to Dist RCA lesion, 80 %stenosed.  Post intervention, there is a 0% residual stenosis.  A stent was successfully placed.  Ost RCA to Mid RCA lesion, 70 %stenosed.  Post intervention, there is a 0% residual stenosis.  A stent was successfully placed. She is a lot of jokes with a   Assessment & Plan    1. Junctional bradycardia:Conduction is currently improved after holding Lopressor. In sinus rhythm today.   2. CAD s/p PCI: No current chest pain  3. Anemia of chronic disease: Status post 2 units of red blood cells. CBC pending.  4. Hypertension:Currently well controlled. Continue to hold Lopressor.    Signed, Melvia Matousek Meredith Leeds, MD  03/18/2016, 8:26 AM

## 2016-03-18 NOTE — Progress Notes (Signed)
Blood transfusions complete. Patient tolerated well, no reactions noted. Will continue to monitor, call light within reach

## 2016-03-18 NOTE — Discharge Summary (Signed)
Discharge Summary    Patient ID: Kevin Rhodes,  MRN: [id number], DOB/AGE: 26-Aug-1933 82 y.o.  Admit date: 03/17/2016 Discharge date: 03/18/2016  Primary Care Provider: [name] Primary Cardiologist: Dr. Gwenlyn Found  Discharge Diagnoses    Principal Problem:   Bradycardia Active Problems:   CAD S/P multiple PCI   Anemia, chronic disease   Essential hypertension   Dyslipidemia   Controlled type 2 diabetes with renal manifestation (HCC)   Chronic kidney disease, stage III (moderate)   Mild aortic stenosis   Symptomatic bradycardia  Allergies Allergies  Allergen Reactions  . Nsaids Other (See Comments)    Avoid due to h/o peptic ulcers  . Ambien [Zolpidem] Other (See Comments)    Mental changes    Diagnostic Studies/Procedures    None _____________   History of Present Illness     82 y/o male followed by Dr Gwenlyn Found with a history of CAD. He is s/p remote MI and PCI to his RCA in 1997. In 2012 he had an LAD and RCA DES placed, (though these records are not in EPIC). His course in 0000000 was complicated by GI bleeding requiring transfusion (Dr Henrene Pastor). He was recently admitted with a NSTEMI (Troponin 0.83) on 03/01/16. He underwent cath and subsequent PCI to the RCA for 70% ISR and an 80% distal RCA stenosis. He was seen in the office  for TOC f/u on 03/13/16. His Hgb on adm was 9.3- at discharge 8.3. Since discharge he has complained of being weak. He had also noted dark stools though he is on iron and he says this has resolved. He denies any syncope or orthostatic symptoms. His last endoscopy was 12/21/15 and showed erosive gastritis but no active PUD. He was seen in the office on 03/13/16 and had a STAT CBC checked (came back Hgb 8.0), and cut back his Lopressor from 50 mg BID to 25 mg BID as his HR was 50.   Since then he says he has developed a "head cold". He says he has been so weak he can't even go outside. He went to an Urgent Care and was noted to have a HR of 40 and  was sent here. Noted to have junctional escape rhythm. He denied any chest pain or dyspnea.   Hospital Course     Consultants: None  His metoprolol was held at the time of admission, and he was transfused 2 units of PRBCs. The following day he reported feeling well after receiving blood. Since holding his lopressor he converted back into sinus rhythm. His H/H post transfusion was 8.4/26.4. No active bleeding reported. Lylith Bebeau plan to check CBC this Monday to follow up.   He was seen and assessed by Dr. Curt Bears on 03/18/16 and determined stable for discharge home. I have sent a message to the office for follow up.  _____________  Discharge Vitals Blood pressure (!) 131/58, pulse 60, temperature 98.1 F (36.7 C), temperature source Oral, resp. rate 16, height 5\' 8"  (1.727 m), weight 185 lb 14.4 oz (84.3 kg), SpO2 98 %.  Filed Weights   03/17/16 1459 03/18/16 0415  Weight: 183 lb (83 kg) 185 lb 14.4 oz (84.3 kg)    Labs & Radiologic Studies    CBC  Recent Labs  03/17/16 1513 03/18/16 0554  WBC 8.6 6.7  NEUTROABS 5.3  --   HGB 8.3* 8.4*  HCT 27.3* 26.4*  MCV 81.5 80.7  PLT 258 [id number]   Basic Metabolic Panel  Recent Labs  03/17/16  1513 03/18/16 0554  NA 138 138  K 4.3 4.4  CL 110 111  CO2 19* 21*  GLUCOSE 177* 140*  BUN 20 21*  CREATININE 1.82* 1.55*  CALCIUM 9.1 8.3*   Liver Function Tests  Recent Labs  03/17/16 1513  AST 75*  ALT 45  ALKPHOS 130*  BILITOT 1.1  PROT 6.5  ALBUMIN 3.4*   No results for input(s): LIPASE, AMYLASE in the last 72 hours. Cardiac Enzymes  Recent Labs  03/17/16 1513  TROPONINI 0.05*   BNP Invalid input(s): POCBNP D-Dimer No results for input(s): DDIMER in the last 72 hours. Hemoglobin A1C No results for input(s): HGBA1C in the last 72 hours. Fasting Lipid Panel No results for input(s): CHOL, HDL, LDLCALC, TRIG, CHOLHDL, LDLDIRECT in the last 72 hours. Thyroid Function Tests  Recent Labs  03/17/16 1726  TSH 0.530    _____________  Dg Chest 2 View  Result Date: 03/01/2016 CLINICAL DATA:  Left arm and chest pain. EXAM: CHEST  2 VIEW COMPARISON:  03/06/2011 FINDINGS: Mild elevation of the right hemidiaphragm is stable. Mild enlargement of the cardiac silhouette is stable. The trachea is midline. The lungs are clear without airspace disease or pulmonary edema. No pleural effusions. IMPRESSION: No acute cardiopulmonary disease. Stable mild cardiomegaly. Electronically Signed   By: Markus Daft M.D.   On: 03/01/2016 16:08   Dg Chest Port 1 View  Result Date: 03/17/2016 CLINICAL DATA:  Weakness. EXAM: PORTABLE CHEST 1 VIEW COMPARISON:  Radiographs of March 01, 2016. FINDINGS: Stable cardiomegaly. No pneumothorax or pleural effusion is noted. No acute pulmonary disease is noted. Old left rib fractures are noted. IMPRESSION: No acute cardiopulmonary abnormality seen. Electronically Signed   By: Marijo Conception, M.D.   On: 03/17/2016 15:51   Disposition   Pt is being discharged home today in good condition.  Follow-up Plans & Appointments    Follow-up Information    Quay Burow, MD Follow up.   Specialties:  Cardiology, Radiology Why:  The office Jisella Ashenfelter call you with a follow up appt. Please call us if you have not receieved a call in 2-3 days.  Contact information: [address]  [place] [address] [phone]        [facility] Follow up on 03/20/2016.   Why:  Please come to have your labs drawn Contact information: [place] [phone]         Discharge Instructions    Diet - low sodium heart healthy    Complete by:  As directed    Increase activity slowly    Complete by:  As directed       Discharge Medications   Current Discharge Medication List    CONTINUE these medications which have NOT CHANGED   Details  !! AMBULATORY NON FORMULARY MEDICATION Take 90 mg  by mouth 2 (two) times daily. Medication Name: BRILINTA 90 mg BID (TWILIGHT Research Study PROVIDED)    !! AMBULATORY NON FORMULARY MEDICATION Take 81 mg by mouth daily. Medication Name: Aspirin 81 mg daily (TWILIGHT Research Study PROVIDED)    amLODipine (NORVASC) 5 MG tablet TAKE 1 TABLET (5 MG TOTAL) BY MOUTH DAILY. Qty: 90 tablet, Refills: 0    glimepiride (AMARYL) 2 MG tablet Take 2 mg by mouth daily.     hydrochlorothiazide (MICROZIDE) 12.5 MG capsule TAKE ONE CAPSULE BY MOUTH EVERY DAY Qty: 90 capsule, Refills: 2    irbesartan (AVAPRO) 300 MG tablet  TAKE 1 TABLET (300 MG TOTAL) BY MOUTH AT BEDTIME. Qty: 30 tablet, Refills: 8    isosorbide mononitrate (IMDUR) 30 MG 24 hr tablet TAKE 1/2 TABLET BY MOUTH EVERY DAY *KEEP APPT* Qty: 15 tablet, Refills: 10    metFORMIN (GLUCOPHAGE) 500 MG tablet Take 1,000 mg by mouth 2 (two) times daily with a meal.     omega-3 acid ethyl esters (LOVAZA) 1 G capsule Take 1 g by mouth 2 (two) times daily.     pantoprazole (PROTONIX) 40 MG tablet Take 1 tablet (40 mg total) by mouth 2 (two) times daily. Take 40mg .   twice daily.  30 minutes before breakfast and 30 minutes before evening meal. Qty: 60 tablet, Refills: 11   Associated Diagnoses: Gastroesophageal reflux disease with esophagitis    Phenylephrine-DM-GG-APAP (MUCINEX SINUS-MAX) 5-10-200-325 MG CAPS Take 1 capsule by mouth 2 (two) times daily as needed (for symptoms).    polysaccharide iron (NIFEREX) 150 MG CAPS capsule Take 150 mg by mouth 2 (two) times daily.      pravastatin (PRAVACHOL) 40 MG tablet Take 0.5 tablets (20 mg total) by mouth at bedtime. Qty: 30 tablet, Refills: 6    timolol (TIMOPTIC) 0.5 % ophthalmic solution Place 1 drop into both eyes daily.     traMADol (ULTRAM) 50 MG tablet Take 50 mg by mouth every 8 (eight) hours as needed (for pain).     nitroGLYCERIN (NITROSTAT) 0.4 MG SL tablet Place 1 tablet (0.4 mg total) under the tongue every 5 (five) minutes x 3 doses as  needed for chest pain. Qty: 25 tablet, Refills: 2     !! - Potential duplicate medications found. Please discuss with provider.    STOP taking these medications     metoprolol tartrate (LOPRESSOR) 25 MG tablet      oxyCODONE-acetaminophen (PERCOCET) 5-325 MG per tablet          Outstanding Labs/Studies   CBC on 11/27  Duration of Discharge Encounter   Greater than 30 minutes including physician time.  Signed, Reino Bellis NP-C 03/18/2016, 2:48 PM  I have seen and examined this patient with Reino Bellis.  Agree with above, note added to reflect my findings.  On exam, regular rhythm, no murmurs, lungs clear. Presented to the hospital in junctional rhythm. Beta blocker held and had resumed sinus rhythm.  Feeling well today. Finnian Husted plan for discharge.  Did have RBC infusion without change in CBC.  As he is feeling well and no GI bleeding, Malaiah Viramontes plan for CBC with PCP Monday.    Taelor Moncada M. Arasely Akkerman MD 03/18/2016 3:37 PM

## 2016-03-19 LAB — TYPE AND SCREEN
ABO/RH(D): A POS
Antibody Screen: NEGATIVE
Unit division: 0
Unit division: 0

## 2016-03-20 ENCOUNTER — Telehealth: Payer: Self-pay

## 2016-03-20 ENCOUNTER — Telehealth: Payer: Self-pay | Admitting: Physician Assistant

## 2016-03-20 DIAGNOSIS — D649 Anemia, unspecified: Secondary | ICD-10-CM

## 2016-03-20 LAB — CBC WITH DIFFERENTIAL/PLATELET
Basophils Absolute: 0 cells/uL (ref 0–200)
Basophils Relative: 0 %
EOS PCT: 10 %
Eosinophils Absolute: 570 cells/uL — ABNORMAL HIGH (ref 15–500)
HCT: 30 % — ABNORMAL LOW (ref 38.5–50.0)
HEMOGLOBIN: 9.4 g/dL — AB (ref 13.2–17.1)
LYMPHS ABS: 1254 {cells}/uL (ref 850–3900)
Lymphocytes Relative: 22 %
MCH: 25.9 pg — ABNORMAL LOW (ref 27.0–33.0)
MCHC: 31.3 g/dL — AB (ref 32.0–36.0)
MCV: 82.6 fL (ref 80.0–100.0)
MPV: 8.7 fL (ref 7.5–12.5)
Monocytes Absolute: 513 cells/uL (ref 200–950)
Monocytes Relative: 9 %
NEUTROS ABS: 3363 {cells}/uL (ref 1500–7800)
NEUTROS PCT: 59 %
PLATELETS: 219 10*3/uL (ref 140–400)
RBC: 3.63 MIL/uL — AB (ref 4.20–5.80)
RDW: 16.6 % — ABNORMAL HIGH (ref 11.0–15.0)
WBC: 5.7 10*3/uL (ref 3.8–10.8)

## 2016-03-20 NOTE — Telephone Encounter (Signed)
[facility] called to make office aware of Hgb of 9.4. This is stable from previous value of 8.4. It appears that Kevin Rhodes has already been working behind the scenes on this. Will forward to her and Dr. Gwenlyn Found for review. Tymara Saur PA-C

## 2016-03-20 NOTE — Telephone Encounter (Signed)
Received a call from patient's daughter Nevin Bloodgood.She stated  father is suppose to have lab work today.Stated he was in hospital recently hgb low.After reviewing discharge summary,he is to have cbc today.Advised he can go to [facility] in [place] now for stat cbc.

## 2016-03-20 NOTE — Telephone Encounter (Signed)
Returned call to patient's daughter Nevin Bloodgood.Stated she was waiting on a call from Lovena Le if father is to have lab work today.Advised I will send message to [facility].

## 2016-03-20 NOTE — Telephone Encounter (Signed)
Follow up    Pt daughter calling for results

## 2016-03-20 NOTE — Telephone Encounter (Signed)
I have sent a message to Dr. Gwenlyn Found, but have not heard back. It was ok to get the labs done, just need to know if the patient still needs to see an app next week since this issue is already being taken care of. Pt has appt scheduled with Dr. Gwenlyn Found on 12/15.

## 2016-03-20 NOTE — Telephone Encounter (Signed)
Spoke to patient's daughter Nevin Bloodgood.Advised Dr.Berry has not reviewed cbc done today.Advised hgb 9.4.She stated Lovena Le was going to ask Dr.Berry if father needs to see a PA on Wed 03/22/16.Advised I will send message to [facility].

## 2016-03-21 ENCOUNTER — Telehealth: Payer: Self-pay | Admitting: Cardiovascular Disease

## 2016-03-21 NOTE — Telephone Encounter (Signed)
Spoke to pt daughter, Paula--OK per DPR. Told her that Dr. Gwenlyn Found said it was ok to wait to follow-up until scheduled appt with him on 04/17/16.  Nevin Bloodgood also asked if it was ok that the pt was taking asa 81 every night and the twilight study medications. I advised that pt should no be taking asa 81 while taking the twilght medications per [facility], PA-C.

## 2016-03-21 NOTE — Telephone Encounter (Signed)
Returned pt call. Spoke to [place]. See telephone note from today.

## 2016-03-21 NOTE — Telephone Encounter (Signed)
Error  spoke to patient's daughter Nevin Bloodgood 03/20/16 hgb improved to 9.4.

## 2016-03-21 NOTE — Telephone Encounter (Signed)
-----   Message from Lorretta Harp, MD sent at 03/21/2016  6:42 AM EST ----- HGB improved since 2 days ago. ? Transfusion

## 2016-03-21 NOTE — Telephone Encounter (Signed)
Returned call to patient lab results given. 

## 2016-03-23 ENCOUNTER — Telehealth: Payer: Self-pay | Admitting: Cardiology

## 2016-03-23 MED ORDER — METOPROLOL TARTRATE 25 MG PO TABS: 25.0000 mg | ORAL_TABLET | Freq: Two times a day (BID) | ORAL | 3 refills | Status: DC

## 2016-03-23 NOTE — Telephone Encounter (Signed)
Per AVS from last visit (03-13-16):  DECREASE the Lopressor to 25 mg taking 1 tablet twice a day  Robin [facility] notified  I do not see on med list I will add

## 2016-03-23 NOTE — Telephone Encounter (Signed)
New message      Pt c/o medication issue:  1. Name of Medication: metoprololmetoprolol  2. How are you currently taking this medication (dosage and times per day)? Directions say take 25mg ---1 tab (50mg ) bid 3. Are you having a reaction (difficulty breathing--STAT)? no 4. What is your medication issue? Please clarify directions.  Do you want him to take 2  25mg  tablets or do you want to call in a 50mg  tablet?

## 2016-03-24 NOTE — Telephone Encounter (Signed)
He should not be on Lopressor per discharge note  Kerin Ransom PA-C 03/24/2016 12:17 PM

## 2016-03-24 NOTE — Telephone Encounter (Signed)
Robin at [facility] notified she will take off profile

## 2016-03-25 NOTE — Progress Notes (Signed)
This is ok

## 2016-03-30 ENCOUNTER — Encounter: Payer: Self-pay | Admitting: *Deleted

## 2016-03-30 DIAGNOSIS — Z006 Encounter for examination for normal comparison and control in clinical research program: Secondary | ICD-10-CM

## 2016-03-30 NOTE — Progress Notes (Signed)
TWILIGHT Research Study month 1 telephone follow up completed. Patient denies any bleeding or other adverse events. He states he has been compliant with ASA and Brilinta, "may have missed 1 Brilinta pill". Reinforced the importance of DAPT. Next research required appointment is due no later than 21/feb/2017 and at that visit he will be randomized to ASA 81 mg or PLACEBO. Questions encouraged and answered. Right before the phone call ended patient stated that he has been " really weak, dizzy and noticed tarry stools since day after discharge. His next cardiology visit is next Friday 04/07/16 with Dr. Gwenlyn Found. I ask the patient if he has had any lab work Since discharge to assess his hgb. Or has he spoken to Doctor about symptoms. He has not addressed any of his symptoms. I encouraged patient to call the cardiology office maybe he could be seen sooner or symptoms addressed. Patient states he will call office.

## 2016-03-31 ENCOUNTER — Encounter: Payer: Self-pay | Admitting: Physician Assistant

## 2016-03-31 ENCOUNTER — Telehealth: Payer: Self-pay | Admitting: Cardiovascular Disease

## 2016-03-31 ENCOUNTER — Ambulatory Visit (INDEPENDENT_AMBULATORY_CARE_PROVIDER_SITE_OTHER): Payer: Medicare Other | Admitting: Physician Assistant

## 2016-03-31 ENCOUNTER — Telehealth: Payer: Self-pay | Admitting: Physician Assistant

## 2016-03-31 VITALS — BP 88/60 | HR 79 | Ht 68.0 in | Wt 185.2 lb

## 2016-03-31 DIAGNOSIS — E785 Hyperlipidemia, unspecified: Secondary | ICD-10-CM

## 2016-03-31 DIAGNOSIS — Z9861 Coronary angioplasty status: Secondary | ICD-10-CM

## 2016-03-31 DIAGNOSIS — R0689 Other abnormalities of breathing: Secondary | ICD-10-CM

## 2016-03-31 DIAGNOSIS — K922 Gastrointestinal hemorrhage, unspecified: Secondary | ICD-10-CM

## 2016-03-31 DIAGNOSIS — E118 Type 2 diabetes mellitus with unspecified complications: Secondary | ICD-10-CM

## 2016-03-31 DIAGNOSIS — R5383 Other fatigue: Secondary | ICD-10-CM

## 2016-03-31 DIAGNOSIS — Z79899 Other long term (current) drug therapy: Secondary | ICD-10-CM

## 2016-03-31 DIAGNOSIS — R05 Cough: Secondary | ICD-10-CM

## 2016-03-31 DIAGNOSIS — K921 Melena: Secondary | ICD-10-CM

## 2016-03-31 DIAGNOSIS — R0989 Other specified symptoms and signs involving the circulatory and respiratory systems: Secondary | ICD-10-CM

## 2016-03-31 DIAGNOSIS — I10 Essential (primary) hypertension: Secondary | ICD-10-CM

## 2016-03-31 DIAGNOSIS — R059 Cough, unspecified: Secondary | ICD-10-CM

## 2016-03-31 DIAGNOSIS — I2510 Atherosclerotic heart disease of native coronary artery without angina pectoris: Secondary | ICD-10-CM

## 2016-03-31 LAB — BASIC METABOLIC PANEL
BUN: 18 mg/dL (ref 7–25)
CHLORIDE: 109 mmol/L (ref 98–110)
CO2: 26 mmol/L (ref 20–31)
CREATININE: 1.6 mg/dL — AB (ref 0.70–1.11)
Calcium: 9.2 mg/dL (ref 8.6–10.3)
Glucose, Bld: 157 mg/dL — ABNORMAL HIGH (ref 65–99)
POTASSIUM: 4.9 mmol/L (ref 3.5–5.3)
Sodium: 141 mmol/L (ref 135–146)

## 2016-03-31 LAB — CBC
HEMATOCRIT: 31 % — AB (ref 38.5–50.0)
Hemoglobin: 9.5 g/dL — ABNORMAL LOW (ref 13.2–17.1)
MCH: 25.7 pg — AB (ref 27.0–33.0)
MCHC: 30.6 g/dL — ABNORMAL LOW (ref 32.0–36.0)
MCV: 84 fL (ref 80.0–100.0)
MPV: 9 fL (ref 7.5–12.5)
Platelets: 203 10*3/uL (ref 140–400)
RBC: 3.69 MIL/uL — ABNORMAL LOW (ref 4.20–5.80)
RDW: 16 % — AB (ref 11.0–15.0)
WBC: 5.2 10*3/uL (ref 3.8–10.8)

## 2016-03-31 NOTE — Telephone Encounter (Signed)
Discussed w Isaac Laud. Per earlier discussion, OK to add pts for 3pm slot if needing to be seen this afternoon. We discussed this patient, reviewed call and recent encounter notes, he recommended stat labs this AM for result today, see patient for afternoon appt to discuss findings.  I have followed up w daughter. She will take patient to [facility] Blood[facility] today for lab draw. Is aware to bring patient here today for appt at 3pm. She voiced thanks for prompt assistance.

## 2016-03-31 NOTE — Telephone Encounter (Signed)
Jonelle Sidle from [facility] called to let us know that pt's Stat labs are done

## 2016-03-31 NOTE — Patient Instructions (Signed)
Medication Instructions: Kevin Deforest, PA-C has recommended making the following medication changes: 1. STOP Metoprolol 2. STOP Amlodipine 3. HOLD Hydrochlorothiazide for 2 days then RESTART  Labwork: NONE ORDERED  Testing/Procedures: 1. Chest X-Ray - A chest x-ray takes a picture of the organs and structures inside the chest, including the heart, lungs, and blood vessels. This test can show several things, including, whether the heart is enlarges; whether fluid is building up in the lungs; and whether pacemaker / defibrillator leads are still in place.  Follow-up: Please keep the following appointments:  >>Dr Gwenlyn Found, 04/07/16 at 11:15a - PLEASE BRING YOUR MEDICATION BOTTLES  >>Paula Guenther NP, 04/07/16 at 9:00a  If you need a refill on your cardiac medications before your next appointment, please call your pharmacy.   Your physician has requested that you regularly monitor and record your blood pressure readings at home. Please use the same machine at the same time of day to check your readings and record them to bring to your follow-up visit. Check your blood pressure twice a day.  >>1st BP - 2 hours AFTER morning medications  >>2nd BP - 8pm

## 2016-03-31 NOTE — Telephone Encounter (Signed)
I have reviewed his lab result and seen the patient during office, medication further adjusted.

## 2016-03-31 NOTE — Telephone Encounter (Signed)
Results viewable in EPIC.  Pt to see Isaac Laud today at 3pm.  These were previsit labs for fatigue and blood in stool. Labs printed for review by provider.  I've returned call and informed [facility] rep - Tamika - that we did get receipt of labs.

## 2016-03-31 NOTE — Telephone Encounter (Signed)
Spoke to daughter. Notes that the patient has been having recurrent blood in stool. PCP and our office have followed him regularly including CBC checks.Kevin Rhodes He is participating in twilight study for ASA and Brilinta and unable to interrupt this therapy. She would like for him to be seen today. Concerned for worsening fatigue. He is not able to get in w PCP office today due to them closing at noon. Cannot see GI until next week. Will discuss w PA to see if he needs to be added for evaluation, have CBC check in lab, etc. Recent CBC on 11/27 was low but improving following a blood transfusion 11/26.

## 2016-03-31 NOTE — Progress Notes (Addendum)
Cardiology Office Note    Date:  04/01/2016   ID:  ROYE Kevin Rhodes, DOB 06/18/1933, MRN [id number]  PCP:  [name], PA-C  Cardiologist:  Dr. Gwenlyn Found   Chief Complaint  Patient presents with  . Follow-up    pt c/o tiredness, was taking antibiotice but stoped due to feeling bad, currently taking hydrocodone cough medicine    History of Present Illness:  Kevin Rhodes is a 82 y.o. male with PMH of HTN, HLD, DM II and CAD s/p remote MI and PCI to RCA in 1997 and DES to LAD/RCA in 2012. His course in 0000000 was complicated by GI bleed and peptic ulcer disease. He was admitted for NSTEMI on 03/01/2016. He underwent cardiac cath and a subsequent PCI to the RCA for 70% ISA and 80% distal RCA stenosis. He was seen in the office on 03/13/2016, he was complaining of some weakness, his hemoglobin on discharge also went from 9.3 to 8.3. Last endoscopy on 12/21/2015 showed erosive gastritis but no active PUD. Stat CBC was obtained. He was also noted to have some bradycardia, his Lopressor was decreased to 25 mg twice a day. He went back to the hospital on 03/17/2016 for increased weakness, fatigue and difficulty walking. He eventually went to [facility] urgent care on 03/18/2015 which noted his pulse was in the mid 2s and he was sent to the emergency department. EKG showed bradycardia. His blood pressure was in the safe range. Hemoglobin was 8.3 on arrival. He was admitted to cardiology service, Lopressor was stopped for symptomatic bradycardia. He was transfused with 2 units of PRBC. He did have mildly elevated troponin which was felt to be demand ischemia with acute on chronic kidney failure and anemia. His symptoms improved after 2 units of packed red blood cell and heart rate improved as well after discontinuing beta blocker.  Patient contacted our office today due to concern of persistent fatigue. He was added on to my scheduled. He denies any recent chest discomfort. He says he had jaw pain, chest pain and  arm pain associated with all 3 of the previous heart attack and but has not noticed any of his usual angina recently after his stent placement. On arrival his blood pressure was 88/60. It is unclear to me which medications he is taking. Based on the discharge summary from recent hospitalization, his metoprolol was discontinued due to junctional bradycardia. However it appears patient was unclear about this and his family actually called cardiology office again several days after discharge, they were informed that his metoprolol had indeed been discontinued. However metoprolol to this day is still on his medication list. While his daughter manages his medication. He says he did about a medication recently and unclear which medication it was. He just had it refilled. He did not bring any of his medication with him today. We did obtain some labs today, despite his weakness and his hemoglobin is coming back up. His renal function worsened slightly which may be related to low blood pressure alone. He does have some baseline shortness of breath, however recently was unchanged. What's bothering him is in fact he has been having a cough. He is still taking some codeine cough drops.   Past Medical History:  Diagnosis Date  . Allergic rhinitis   . Anemia   . Arthritis   . Benign neoplasm of colon   . Blood transfusion without reported diagnosis   . CAD (coronary artery disease)    a. DES to RCA  and LAD in 2012  b. cath 02/2016: 70% ost RCA stenosis and 80% distal RCA stenosis, treated with placement of 2 Synergy DES  . Calculus of bile duct without mention of cholecystitis or obstruction   . Depression    after wife passed away  . Diabetes mellitus    "pills"  . Diverticulosis of colon (without mention of hemorrhage)   . Esophageal reflux   . Hemorrhoids   . Hyperlipidemia   . Hypertension   . Internal hemorrhoids without mention of complication   . Kidney stones   . Myocardial infarction 09/1995  .  NSTEMI (non-ST elevated myocardial infarction) ([place]) 03/02/2006  . Osteoporosis   . Other and unspecified hyperlipidemia   . Peptic ulcer, unspecified site, unspecified as acute or chronic, without mention of hemorrhage, perforation, or obstruction   . Pneumonia   . Shortness of breath 03/06/11    "w/exertion"  . Stricture and stenosis of esophagus   . Unspecified sinusitis (chronic)     Past Surgical History:  Procedure Laterality Date  . CARDIAC CATHETERIZATION N/A 03/02/2016   Procedure: Left Heart Cath and Coronary Angiography;  Surgeon: Lorretta Harp, MD;  Location: [facility];  Service: Cardiovascular;  Laterality: N/A;  . CARDIAC CATHETERIZATION N/A 03/02/2016   Procedure: Coronary Stent Intervention;  Surgeon: Lorretta Harp, MD;  Location: [facility];  Service: Cardiovascular;  Laterality: N/A;  . CATARACT EXTRACTION W/ INTRAOCULAR LENS  IMPLANT, BILATERAL Bilateral   . COLONOSCOPY  01/26/11   severe left diverticulosis, internal hemorrhoids  . CORONARY ANGIOPLASTY WITH STENT PLACEMENT  01/20/2011   "2"  . [place]   '1"  . CORONARY ANGIOPLASTY WITH STENT PLACEMENT  03/02/2016  . DOPPLER ECHOCARDIOGRAPHY  2012, 2006  . ESOPHAGOGASTRODUODENOSCOPY  01/26/11   esophageal stricture, antral ulcers - H. pylori bx negative  . stress myocardial perfusion  2011  . UPPER GASTROINTESTINAL ENDOSCOPY    . vascular uktrasound      Current Medications: Outpatient Medications Prior to Visit  Medication Sig Dispense Refill  . AMBULATORY NON FORMULARY MEDICATION Take 90 mg by mouth 2 (two) times daily. Medication Name: BRILINTA 90 mg BID (TWILIGHT Research Study PROVIDED)    . AMBULATORY NON FORMULARY MEDICATION Take 81 mg by mouth daily. Medication Name: Aspirin 81 mg daily (TWILIGHT Research Study PROVIDED) (Patient taking differently: Take 81 mg by mouth every morning. Medication Name: Aspirin 81 mg daily (TWILIGHT Research Study  PROVIDED))    . glimepiride (AMARYL) 2 MG tablet Take 2 mg by mouth daily.     . hydrochlorothiazide (MICROZIDE) 12.5 MG capsule TAKE ONE CAPSULE BY MOUTH EVERY DAY (Patient taking differently: Take 12.5 mg by mouth once a day) 90 capsule 2  . irbesartan (AVAPRO) 300 MG tablet TAKE 1 TABLET (300 MG TOTAL) BY MOUTH AT BEDTIME. 30 tablet 8  . isosorbide mononitrate (IMDUR) 30 MG 24 hr tablet TAKE 1/2 TABLET BY MOUTH EVERY DAY *KEEP APPT* (Patient taking differently: Take 15 mg by mouth once a day) 15 tablet 10  . metFORMIN (GLUCOPHAGE) 500 MG tablet Take 1,000 mg by mouth 2 (two) times daily with a meal.     . nitroGLYCERIN (NITROSTAT) 0.4 MG SL tablet Place 1 tablet (0.4 mg total) under the tongue every 5 (five) minutes x 3 doses as needed for chest pain. 25 tablet 2  . omega-3 acid ethyl esters (LOVAZA) 1 G capsule Take 1 g by mouth 2 (two) times daily.     Marland Kitchen  pantoprazole (PROTONIX) 40 MG tablet Take 1 tablet (40 mg total) by mouth 2 (two) times daily. Take 40mg .   twice daily.  30 minutes before breakfast and 30 minutes before evening meal. (Patient taking differently: Take 40 mg by mouth 2 (two) times daily. ) 60 tablet 11  . Phenylephrine-DM-GG-APAP (MUCINEX SINUS-MAX) 5-10-200-325 MG CAPS Take 1 capsule by mouth 2 (two) times daily as needed (for symptoms).    . polysaccharide iron (NIFEREX) 150 MG CAPS capsule Take 150 mg by mouth 2 (two) times daily.      . pravastatin (PRAVACHOL) 40 MG tablet Take 0.5 tablets (20 mg total) by mouth at bedtime. 30 tablet 6  . timolol (TIMOPTIC) 0.5 % ophthalmic solution Place 1 drop into both eyes daily.     . traMADol (ULTRAM) 50 MG tablet Take 50 mg by mouth every 8 (eight) hours as needed (for pain).     Marland Kitchen amLODipine (NORVASC) 5 MG tablet TAKE 1 TABLET (5 MG TOTAL) BY MOUTH DAILY. 90 tablet 0  . metoprolol tartrate (LOPRESSOR) 25 MG tablet Take 1 tablet (25 mg total) by mouth 2 (two) times daily. 180 tablet 3   No facility-administered medications prior to  visit.      Allergies:   Nsaids and Ambien [zolpidem]   Social History   Social History  . Marital status: Widowed    Spouse name: N/A  . Number of children: 3  . Years of education: N/A   Occupational History  . retired    Social History Main Topics  . Smoking status: Former Smoker    Packs/day: 2.00    Years: 45.00    Types: Cigarettes    Quit date: 1997  . Smokeless tobacco: Never Used     Comment: "quit smoking cigarettes ~ 1997"  . Alcohol use No  . Drug use: No  . Sexual activity: No   Other Topics Concern  . None   Social History Narrative  . None     Family History:  The patient's family history includes Diabetes in his mother; Heart disease in his mother.   ROS:   Please see the history of present illness.    ROS All other systems reviewed and are negative.   PHYSICAL EXAM:   VS:  BP (!) 88/60 (BP Location: Right Arm, Patient Position: Sitting, Cuff Size: Normal)   Pulse 79   Ht 5\' 8"  (1.727 m)   Wt 185 lb 3.2 oz (84 kg)   BMI 28.16 kg/m    GEN: Well nourished, well developed, in no acute distress  HEENT: normal  Neck: no JVD, carotid bruits, or masses Cardiac: RRR; no murmurs, rubs, or gallops,no edema  Respiratory:  normal work of breathing. Mildly diminished breath sound in R base GI: soft, nontender, nondistended, + BS MS: no deformity or atrophy  Skin: warm and dry, no rash Neuro:  Alert and Oriented x 3, Strength and sensation are intact Psych: euthymic mood, full affect  Wt Readings from Last 3 Encounters:  03/31/16 185 lb 3.2 oz (84 kg)  03/18/16 185 lb 14.4 oz (84.3 kg)  03/13/16 183 lb 12.8 oz (83.4 kg)      Studies/Labs Reviewed:   EKG:  EKG is ordered today.  The ekg ordered today demonstrates Normal sinus rhythm, downslope ST changes with persistent T-wave inversion in inferior lead unchanged compared to previous EKG  Recent Labs: 03/17/2016: ALT 45; B Natriuretic Peptide 123.1; TSH 0.530 03/31/2016: BUN 18; Creat 1.60;  Hemoglobin 9.5; Platelets 203;  Potassium 4.9; Sodium 141   Lipid Panel    Component Value Date/Time   CHOL 149 03/02/2016 0323   TRIG 143 03/02/2016 0323   HDL 27 (L) 03/02/2016 0323   CHOLHDL 5.5 03/02/2016 0323   VLDL 29 03/02/2016 0323   LDLCALC 93 03/02/2016 0323    Additional studies/ records that were reviewed today include:   Echo 10/15/2015 LV EF: 50% -   55%  - Left ventricle: Inferior wall hypokinesis The cavity size was   mildly dilated. Wall thickness was increased in a pattern of mild   LVH. Systolic function was normal. The estimated ejection   fraction was in the range of 50% to 55%. Wall motion was normal;   there were no regional wall motion abnormalities. Left   ventricular diastolic function parameters were normal. - Aortic valve: There was mild stenosis. - Mitral valve: Calcified annulus. Mildly thickened leaflets . - Left atrium: The atrium was moderately dilated. - Atrial septum: No defect or patent foramen ovale was identified    Cath 03/02/2016 Conclusion     Ost LAD to Mid LAD lesion, 0 %stenosed.  Dist RCA lesion, 40 %stenosed.  Mid RCA to Dist RCA lesion, 80 %stenosed.  Post intervention, there is a 0% residual stenosis.  A stent was successfully placed.  Ost RCA to Mid RCA lesion, 70 %stenosed.  Post intervention, there is a 0% residual stenosis.  A stent was successfully placed.        ASSESSMENT:    1. Decreased breath sounds at right lung base   2. Cough   3. Other fatigue   4. Essential hypertension   5. Controlled type 2 diabetes mellitus with complication, without long-term current use of insulin ([place])   6. Hyperlipidemia, unspecified hyperlipidemia type   7. Coronary artery disease involving native coronary artery of native heart without angina pectoris   8. Gastrointestinal hemorrhage, unspecified gastrointestinal hemorrhage type      PLAN:  In order of problems listed above:  1. Cough with decrease breath  sound in right lung base - According to his grandson for accompanying him today, he was prescribed a course of antibiotic recently, unfortunately he only took it for 2 days before discontinuing. He was concerned about having worsening dizziness as result of it. He was unsure if the dizziness is actually related to codeine cough syrup he has been taking. He says he does not have any fever but does have occasional chill. He thinks his cough is getting better. On physical exam he did have some mildly diminished breath sounds in the right base, I will obtain outpatient 2 view chest x-ray.  2. Fatigue: He has been persistently fatigued since recent PCI. His EKG at baseline shows downsloping ST segment in the inferior lead, however when compared to the previous EKG, this is unchanged. Some of his fatigue may be related to low blood pressure. I have asked him to monitor his blood pressure twice a day and bring his blood pressure diary to the next visit. I will discontinue his amlodipine. I have informed again that his metoprolol was recently discontinued during the last hospitalization due to junctional bradycardia with heart rate in the low 40s. Given elevation of creatinine, I have also asked him to hold his hydrochlorothiazide for 2 days before resuming. Despite the fact that his hemoglobin is stable, he continued to complain of occasional dark stools. His daughter actually arranged for GI follow-up next week as well.  3. CAD: s/p multiple PCI, most  recent cath was seated in early November, he received drug-eluting stent to RCA. He is currently enrolled in the twilight trial and received Brilinta and aspirin through the trial.  4. HTN: Blood pressure quite low today, which may cause some his fatigue. I have discontinued his amlodipine. He was supposed to discontinue his metoprolol after recent hospital release, however it is still listed as one the medication he takes at home. I have asked him to bring his  medication bottles to the next office visit.  5. HLD: On Pravachol  6. DM II: On metformin    Medication Adjustments/Labs and Tests Ordered: Current medicines are reviewed at length with the patient today.  Concerns regarding medicines are outlined above.  Medication changes, Labs and Tests ordered today are listed in the Patient Instructions below. Patient Instructions  Medication Instructions: Almyra Deforest, PA-C has recommended making the following medication changes: 1. STOP Metoprolol 2. STOP Amlodipine 3. HOLD Hydrochlorothiazide for 2 days then RESTART  Labwork: NONE ORDERED  Testing/Procedures: 1. Chest X-Ray - A chest x-ray takes a picture of the organs and structures inside the chest, including the heart, lungs, and blood vessels. This test can show several things, including, whether the heart is enlarges; whether fluid is building up in the lungs; and whether pacemaker / defibrillator leads are still in place.  Follow-up: Please keep the following appointments:  >>Dr Gwenlyn Found, 04/07/16 at 11:15a - PLEASE BRING YOUR MEDICATION BOTTLES  >>Paula Guenther NP, 04/07/16 at 9:00a  If you need a refill on your cardiac medications before your next appointment, please call your pharmacy.   Your physician has requested that you regularly monitor and record your blood pressure readings at home. Please use the same machine at the same time of day to check your readings and record them to bring to your follow-up visit. Check your blood pressure twice a day.  >>1st BP - 2 hours AFTER morning medications  >>2nd BP - 8pm    Hilbert Corrigan, [place]  04/01/2016 1:11 AM    [address], [place], [place]  [address] Phone: [phone]; Fax: [phone]

## 2016-03-31 NOTE — Telephone Encounter (Signed)
New message  Pt's daughter is calling concerning blood  Losing blood through his stool  Fatigued, generally does not feel well

## 2016-04-01 ENCOUNTER — Encounter: Payer: Self-pay | Admitting: Physician Assistant

## 2016-04-07 ENCOUNTER — Ambulatory Visit: Payer: Medicare Other | Admitting: Nurse Practitioner

## 2016-04-07 ENCOUNTER — Encounter: Payer: Self-pay | Admitting: Cardiovascular Disease

## 2016-04-07 ENCOUNTER — Ambulatory Visit (INDEPENDENT_AMBULATORY_CARE_PROVIDER_SITE_OTHER): Payer: Medicare Other | Admitting: Cardiovascular Disease

## 2016-04-07 ENCOUNTER — Encounter (INDEPENDENT_AMBULATORY_CARE_PROVIDER_SITE_OTHER): Payer: Medicare Other

## 2016-04-07 VITALS — BP 110/80 | HR 77 | Ht 68.0 in | Wt 184.0 lb

## 2016-04-07 DIAGNOSIS — E785 Hyperlipidemia, unspecified: Secondary | ICD-10-CM

## 2016-04-07 DIAGNOSIS — Z9861 Coronary angioplasty status: Secondary | ICD-10-CM

## 2016-04-07 DIAGNOSIS — I10 Essential (primary) hypertension: Secondary | ICD-10-CM | POA: Diagnosis not present

## 2016-04-07 DIAGNOSIS — R001 Bradycardia, unspecified: Secondary | ICD-10-CM

## 2016-04-07 DIAGNOSIS — I2510 Atherosclerotic heart disease of native coronary artery without angina pectoris: Secondary | ICD-10-CM | POA: Diagnosis not present

## 2016-04-07 NOTE — Assessment & Plan Note (Signed)
History of hypertensive blood pressure measures 110/80. He is on hydrochlorothiazide and Avapro. Continue current meds at current dosing

## 2016-04-07 NOTE — Progress Notes (Signed)
04/07/2016 Kevin Rhodes   15-Dec-1933  [id number]  Primary Physician [name], PA-C Primary Cardiologist: Lorretta Harp MD Kevin Rhodes  HPI:  The patient is a delightful 82 year old, thin-appearing, widowed (wife passed away 3 years ago) Caucasian male, father of 57, grandfather to 7 grandchildren who I saw 10/19/15... He has a history of CAD status post inferior wall myocardial infarction, PCI and stenting by myself on October 15, 1995. He had moderate LAD and circumflex disease as well as ramus disease at that time. Functional studies performed November 2011 showed inferior scar without ischemia, unchanged from prior studies. His other problems include hypertension, hyperlipidemia and remote tobacco abuse, having quit back in 1997. He was complaining of exertional dyspnea with pain going to his jaws and arms prompting heart cath reveal LAD and RCA disease which I stented in a staged fashion using 9 drug-eluting stents. His symptoms resolved. He became anemic and had a GI bleed and underwent colonoscopy by Dr. Henrene Pastor revealing diverticulosis, esophageal strictures and ulcers, as well as an antral ulcer. PPIs were doubled. I did begin him on some Lexapro when I saw him last for situational depression which he did not tolerate, though he apparently is getting better over time. Since I saw him approximately 6 months ago he was admitted on 03/01/16 with unstable angina. His troponins bumped up to 1. He underwent cardiac catheterization by myself on 03/02/16 revealing a patent LAD stent, 70% "in-stent restenosis" within the proximal RCA stent with a tandem 80% lesion. Both of these RCA lesions were stented with surgery drug-eluting stents. He was enrolled in the twilight study. He was discharged home and readmitted on 03/17/16 with bradycardia and anemia. His bradycardia improved with discontinuation of beta blocker. He was transfused 2 units of packed red blood cells. Since being at home he  said several episodes of chest pain radiating to his jaw lasting up to 5 minutes at a time.    Current Outpatient Prescriptions  Medication Sig Dispense Refill  . AMBULATORY NON FORMULARY MEDICATION Take 90 mg by mouth 2 (two) times daily. Medication Name: BRILINTA 90 mg BID (TWILIGHT Research Study PROVIDED)    . AMBULATORY NON FORMULARY MEDICATION Take 81 mg by mouth daily. Medication Name: Aspirin 81 mg daily (TWILIGHT Research Study PROVIDED) (Patient taking differently: Take 81 mg by mouth every morning. Medication Name: Aspirin 81 mg daily (TWILIGHT Research Study PROVIDED))    . glimepiride (AMARYL) 2 MG tablet Take 2 mg by mouth daily.     . hydrochlorothiazide (MICROZIDE) 12.5 MG capsule TAKE ONE CAPSULE BY MOUTH EVERY DAY (Patient taking differently: Take 12.5 mg by mouth once a day) 90 capsule 2  . HYDROcodone-homatropine (HYCODAN) 5-1.5 MG/5ML syrup Take 5 mLs by mouth as needed.  0  . irbesartan (AVAPRO) 300 MG tablet TAKE 1 TABLET (300 MG TOTAL) BY MOUTH AT BEDTIME. 30 tablet 8  . isosorbide mononitrate (IMDUR) 30 MG 24 hr tablet TAKE 1/2 TABLET BY MOUTH EVERY DAY *KEEP APPT* (Patient taking differently: Take 15 mg by mouth once a day) 15 tablet 10  . metFORMIN (GLUCOPHAGE) 500 MG tablet Take 1,000 mg by mouth 2 (two) times daily with a meal.     . nitroGLYCERIN (NITROSTAT) 0.4 MG SL tablet Place 1 tablet (0.4 mg total) under the tongue every 5 (five) minutes x 3 doses as needed for chest pain. 25 tablet 2  . omega-3 acid ethyl esters (LOVAZA) 1 G capsule Take 1 g by mouth  2 (two) times daily.     . pantoprazole (PROTONIX) 40 MG tablet Take 1 tablet (40 mg total) by mouth 2 (two) times daily. Take 40mg .   twice daily.  30 minutes before breakfast and 30 minutes before evening meal. (Patient taking differently: Take 40 mg by mouth 2 (two) times daily. ) 60 tablet 11  . Phenylephrine-DM-GG-APAP (MUCINEX SINUS-MAX) 5-10-200-325 MG CAPS Take 1 capsule by mouth 2 (two) times daily as  needed (for symptoms).    . polysaccharide iron (NIFEREX) 150 MG CAPS capsule Take 150 mg by mouth 2 (two) times daily.      . pravastatin (PRAVACHOL) 40 MG tablet Take 0.5 tablets (20 mg total) by mouth at bedtime. 30 tablet 6  . timolol (TIMOPTIC) 0.5 % ophthalmic solution Place 1 drop into both eyes daily.     . traMADol (ULTRAM) 50 MG tablet Take 50 mg by mouth every 8 (eight) hours as needed (for pain).      No current facility-administered medications for this visit.     Allergies  Allergen Reactions  . Nsaids Other (See Comments)    Avoid due to h/o peptic ulcers  . Ambien [Zolpidem] Other (See Comments)    Mental changes    Social History   Social History  . Marital status: Widowed    Spouse name: N/A  . Number of children: 3  . Years of education: N/A   Occupational History  . retired    Social History Main Topics  . Smoking status: Former Smoker    Packs/day: 2.00    Years: 45.00    Types: Cigarettes    Quit date: 1997  . Smokeless tobacco: Never Used     Comment: "quit smoking cigarettes ~ 1997"  . Alcohol use No  . Drug use: No  . Sexual activity: No   Other Topics Concern  . Not on file   Social History Narrative  . No narrative on file     Review of Systems: General: negative for chills, fever, night sweats or weight changes.  Cardiovascular: negative for chest pain, dyspnea on exertion, edema, orthopnea, palpitations, paroxysmal nocturnal dyspnea or shortness of breath Dermatological: negative for rash Respiratory: negative for cough or wheezing Urologic: negative for hematuria Abdominal: negative for nausea, vomiting, diarrhea, bright red blood per rectum, melena, or hematemesis Neurologic: negative for visual changes, syncope, or dizziness All other systems reviewed and are otherwise negative except as noted above.    Blood pressure 110/80, pulse 77, height 5\' 8"  (1.727 m), weight 184 lb (83.5 kg).  General appearance: alert and no  distress Neck: no adenopathy, no carotid bruit, no JVD, supple, symmetrical, trachea midline and thyroid not enlarged, symmetric, no tenderness/mass/nodules Lungs: clear to auscultation bilaterally Heart: regular rate and rhythm, S1, S2 normal, no murmur, click, rub or gallop Extremities: extremities normal, atraumatic, no cyanosis or edema  EKG sinus rhythm at 77 with poor R-wave progression and left axis deviation. I personally reviewed his EKG  ASSESSMENT AND PLAN:   CAD S/P multiple PCI History of CAD status post MI in 1997 with PCI. I stented his RCA and LAD with drug-eluting stents in 2012. Because of recurrent symptoms I recath him 03/02/16 revealing a patent LAD stent with 70% "in-stent restenosis within the proximal RCA stent and 80% lesion distal to that both of which I stented with Synergy drug-eluting stents. He was enrolled in a twilight study. He was subsequently admitted because of bradycardia and anemia. He converted this sinus rhythm. Beta  blocker was held and he is transfused 2 units of packed red blood cells. He has complained of symptoms of chest pain lasting 5 minutes at a time since discharge. These may be related to tachycardia and bradycardia arrhythmias. We will recheck an event monitor.  Essential hypertension History of hypertensive blood pressure measures 110/80. He is on hydrochlorothiazide and Avapro. Continue current meds at current dosing  Dyslipidemia History of hyperlipidemia on statin therapy with lipid profile performed 03/02/16 revealed a total cholesterol 149, LDL 93 and HDL of 27.  Symptomatic bradycardia History of symptomatic bradycardia improved after discontinuation of beta blockade.      Lorretta Harp MD FACP,FACC,FAHA, [facility] 04/07/2016 12:09 PM

## 2016-04-07 NOTE — Assessment & Plan Note (Signed)
History of symptomatic bradycardia improved after discontinuation of beta blockade.

## 2016-04-07 NOTE — Patient Instructions (Signed)
Medication Instructions: Your physician recommends that you continue on your current medications as directed. Please refer to the Current Medication list given to you today.  Testing/Procedures: Your physician has recommended that you wear a 30 day event monitor. Event monitors are medical devices that record the heart's electrical activity. Doctors most often [place] these monitors to diagnose arrhythmias. Arrhythmias are problems with the speed or rhythm of the heartbeat. The monitor is a small, portable device. You can wear one while you do your normal daily activities. This is usually used to diagnose what is causing palpitations/syncope (passing out).  Follow-Up: Your physician recommends that you schedule a follow-up appointment in: 3 months with Dr. Gwenlyn Found.  If you need a refill on your cardiac medications before your next appointment, please call your pharmacy.

## 2016-04-07 NOTE — Assessment & Plan Note (Signed)
History of hyperlipidemia on statin therapy with lipid profile performed 03/02/16 revealed a total cholesterol 149, LDL 93 and HDL of 27.

## 2016-04-07 NOTE — Assessment & Plan Note (Signed)
History of CAD status post MI in 1997 with PCI. I stented his RCA and LAD with drug-eluting stents in 2012. Because of recurrent symptoms I recath him 03/02/16 revealing a patent LAD stent with 70% "in-stent restenosis within the proximal RCA stent and 80% lesion distal to that both of which I stented with Synergy drug-eluting stents. He was enrolled in a twilight study. He was subsequently admitted because of bradycardia and anemia. He converted this sinus rhythm. Beta blocker was held and he is transfused 2 units of packed red blood cells. He has complained of symptoms of chest pain lasting 5 minutes at a time since discharge. These may be related to tachycardia and bradycardia arrhythmias. We will recheck an event monitor.

## 2016-04-28 ENCOUNTER — Telehealth: Payer: Self-pay | Admitting: Cardiovascular Disease

## 2016-04-28 NOTE — Telephone Encounter (Signed)
Received notification from [employer] from Day 15 of pt event monitor. Dr. Gwenlyn Found reviewed and noted NSVT on 04/26/16.

## 2016-04-30 ENCOUNTER — Emergency Department (HOSPITAL_COMMUNITY)
Admission: EM | Admit: 2016-04-30 | Discharge: 2016-05-01 | Disposition: A | Discharge status: Home / Self Care | Payer: Medicare Other | Attending: Emergency Medicine | Admitting: Emergency Medicine

## 2016-04-30 ENCOUNTER — Emergency Department (HOSPITAL_COMMUNITY): Payer: Medicare Other

## 2016-04-30 ENCOUNTER — Encounter (HOSPITAL_COMMUNITY): Payer: Self-pay

## 2016-04-30 DIAGNOSIS — I2510 Atherosclerotic heart disease of native coronary artery without angina pectoris: Secondary | ICD-10-CM | POA: Insufficient documentation

## 2016-04-30 DIAGNOSIS — E1165 Type 2 diabetes mellitus with hyperglycemia: Secondary | ICD-10-CM | POA: Diagnosis not present

## 2016-04-30 DIAGNOSIS — N289 Disorder of kidney and ureter, unspecified: Secondary | ICD-10-CM

## 2016-04-30 DIAGNOSIS — Z79899 Other long term (current) drug therapy: Secondary | ICD-10-CM | POA: Insufficient documentation

## 2016-04-30 DIAGNOSIS — Z87891 Personal history of nicotine dependence: Secondary | ICD-10-CM | POA: Insufficient documentation

## 2016-04-30 DIAGNOSIS — R739 Hyperglycemia, unspecified: Secondary | ICD-10-CM

## 2016-04-30 DIAGNOSIS — R079 Chest pain, unspecified: Secondary | ICD-10-CM | POA: Diagnosis present

## 2016-04-30 DIAGNOSIS — D649 Anemia, unspecified: Secondary | ICD-10-CM | POA: Diagnosis not present

## 2016-04-30 DIAGNOSIS — I252 Old myocardial infarction: Secondary | ICD-10-CM | POA: Insufficient documentation

## 2016-04-30 DIAGNOSIS — Z7984 Long term (current) use of oral hypoglycemic drugs: Secondary | ICD-10-CM | POA: Insufficient documentation

## 2016-04-30 DIAGNOSIS — Z955 Presence of coronary angioplasty implant and graft: Secondary | ICD-10-CM | POA: Insufficient documentation

## 2016-04-30 LAB — BASIC METABOLIC PANEL
ANION GAP: 12 (ref 5–15)
BUN: 15 mg/dL (ref 6–20)
CHLORIDE: 106 mmol/L (ref 101–111)
CO2: 22 mmol/L (ref 22–32)
Calcium: 9 mg/dL (ref 8.9–10.3)
Creatinine, Ser: 1.63 mg/dL — ABNORMAL HIGH (ref 0.61–1.24)
GFR calc Af Amer: 44 mL/min — ABNORMAL LOW (ref >60)
GFR calc non Af Amer: 38 mL/min — ABNORMAL LOW (ref >60)
GLUCOSE: 320 mg/dL — AB (ref 65–99)
Potassium: 3.6 mmol/L (ref 3.5–5.1)
Sodium: 140 mmol/L (ref 135–145)

## 2016-04-30 LAB — CBC
HEMATOCRIT: 29.4 % — AB (ref 39.0–52.0)
HEMOGLOBIN: 9 g/dL — AB (ref 13.0–17.0)
MCH: 25 pg — ABNORMAL LOW (ref 26.0–34.0)
MCHC: 30.6 g/dL (ref 30.0–36.0)
MCV: 81.7 fL (ref 78.0–100.0)
Platelets: 195 10*3/uL (ref 150–400)
RBC: 3.6 MIL/uL — ABNORMAL LOW (ref 4.22–5.81)
RDW: 15.8 % — ABNORMAL HIGH (ref 11.5–15.5)
WBC: 5.5 10*3/uL (ref 4.0–10.5)

## 2016-04-30 LAB — I-STAT TROPONIN, ED: Troponin i, poc: 0.05 ng/mL (ref 0.00–0.08)

## 2016-04-30 LAB — TROPONIN I: Troponin I: 0.06 ng/mL (ref <0.03)

## 2016-04-30 MED ORDER — TRAMADOL HCL 50 MG PO TABS
50.0000 mg | ORAL_TABLET | Freq: Once | ORAL | Status: AC
  Administered 2016-04-30: 50 mg via ORAL
  Filled 2016-04-30: qty 1

## 2016-04-30 NOTE — ED Notes (Signed)
Spoke with lab to run troponin off original blood sent now per order.

## 2016-04-30 NOTE — ED Provider Notes (Addendum)
[facility] DEPT Provider Note   CSN: [id number] Arrival date & time: 04/30/16  2028     History   Chief Complaint Chief Complaint  Patient presents with  . Chest Pain    HPI Kevin Rhodes is a 83 y.o. male.  Status post fleeting episode of central chest pain radiating to left arm lasting approximately 5 minutes at 6 PM. Symptoms have now disappeared. Status post 2 stents placed 03/03/16. He has a long history of coronary artery disease. No new dyspnea, diaphoresis, nausea. His daughter reports that he is now back to normal.      Past Medical History:  Diagnosis Date  . Allergic rhinitis   . Anemia   . Arthritis   . Benign neoplasm of colon   . Blood transfusion without reported diagnosis   . CAD (coronary artery disease)    a. DES to RCA and LAD in 2012  b. cath 02/2016: 70% ost RCA stenosis and 80% distal RCA stenosis, treated with placement of 2 Synergy DES  . Calculus of bile duct without mention of cholecystitis or obstruction   . Depression    after wife passed away  . Diabetes mellitus    "pills"  . Diverticulosis of colon (without mention of hemorrhage)   . Esophageal reflux   . Hemorrhoids   . Hyperlipidemia   . Hypertension   . Internal hemorrhoids without mention of complication   . Kidney stones   . Myocardial infarction 09/1995  . NSTEMI (non-ST elevated myocardial infarction) ([place]) 03/02/2006  . Osteoporosis   . Other and unspecified hyperlipidemia   . Peptic ulcer, unspecified site, unspecified as acute or chronic, without mention of hemorrhage, perforation, or obstruction   . Pneumonia   . Shortness of breath 03/06/11    "w/exertion"  . Stricture and stenosis of esophagus   . Unspecified sinusitis (chronic)     Patient Active Problem List   Diagnosis Date Noted  . Mild aortic stenosis 03/17/2016  . Symptomatic bradycardia 03/17/2016  . Bradycardia 03/13/2016  . NSTEMI (non-ST elevated myocardial infarction) ([place]) 03/01/2016  .  Controlled type 2 diabetes with renal manifestation ([place]) 03/01/2016  . Chronic kidney disease, stage III (moderate) 03/01/2016  . Acute coronary syndrome ([place]) 03/01/2016  . Dyslipidemia 03/24/2014  . Essential hypertension 04/07/2013  . Deficiency anemia 09/07/2011  . Right low back pain 07/06/2011  . Cirrhosis ([place]) 06/30/2011  . Inguinal hernia, bilateral 06/30/2011  . Anemia, chronic disease 03/06/2011  . COLONIC POLYPS 07/12/2007  . CAD S/P multiple PCI 07/12/2007  . HEMORRHOIDS, INTERNAL 07/12/2007  . SINUSITIS 07/12/2007  . EROSIVE ESOPHAGITIS 07/12/2007  . ESOPHAGEAL STRICTURE 07/12/2007  . GERD 07/12/2007  . PEPTIC ULCER DISEASE 07/12/2007  . DIVERTICULOSIS, COLON 07/12/2007  . CHOLEDOCHOLITHIASIS 07/12/2007    Past Surgical History:  Procedure Laterality Date  . CARDIAC CATHETERIZATION N/A 03/02/2016   Procedure: Left Heart Cath and Coronary Angiography;  Surgeon: Lorretta Harp, MD;  Location: [facility];  Service: Cardiovascular;  Laterality: N/A;  . CARDIAC CATHETERIZATION N/A 03/02/2016   Procedure: Coronary Stent Intervention;  Surgeon: Lorretta Harp, MD;  Location: [facility];  Service: Cardiovascular;  Laterality: N/A;  . CATARACT EXTRACTION W/ INTRAOCULAR LENS  IMPLANT, BILATERAL Bilateral   . COLONOSCOPY  01/26/11   severe left diverticulosis, internal hemorrhoids  . CORONARY ANGIOPLASTY WITH STENT PLACEMENT  01/20/2011   "2"  . [place]   '1"  . CORONARY ANGIOPLASTY WITH STENT PLACEMENT  03/02/2016  . DOPPLER ECHOCARDIOGRAPHY  2012, 2006  . ESOPHAGOGASTRODUODENOSCOPY  01/26/11   esophageal stricture, antral ulcers - H. pylori bx negative  . stress myocardial perfusion  2011  . UPPER GASTROINTESTINAL ENDOSCOPY    . vascular uktrasound         Home Medications    Prior to Admission medications   Medication Sig Start Date End Date Taking? Authorizing Provider  AMBULATORY NON FORMULARY  MEDICATION Take 90 mg by mouth 2 (two) times daily. Medication Name: BRILINTA 90 mg BID (TWILIGHT Research Study PROVIDED) 03/03/16   Burnell Blanks, MD  AMBULATORY NON FORMULARY MEDICATION Take 81 mg by mouth daily. Medication Name: Aspirin 81 mg daily (TWILIGHT Research Study PROVIDED) Patient taking differently: Take 81 mg by mouth every morning. Medication Name: Aspirin 81 mg daily (TWILIGHT Research Study PROVIDED) 03/03/16   Burnell Blanks, MD  glimepiride (AMARYL) 2 MG tablet Take 2 mg by mouth daily.  04/03/13   Historical Provider, MD  hydrochlorothiazide (MICROZIDE) 12.5 MG capsule TAKE ONE CAPSULE BY MOUTH EVERY DAY Patient taking differently: Take 12.5 mg by mouth once a day 07/29/15   Lorretta Harp, MD  HYDROcodone-homatropine [facility]) 5-1.5 MG/5ML syrup Take 5 mLs by mouth as needed. 03/24/16   Historical Provider, MD  irbesartan (AVAPRO) 300 MG tablet TAKE 1 TABLET (300 MG TOTAL) BY MOUTH AT BEDTIME. 08/30/15   Lorretta Harp, MD  isosorbide mononitrate (IMDUR) 30 MG 24 hr tablet TAKE 1/2 TABLET BY MOUTH EVERY DAY *KEEP APPT* Patient taking differently: Take 15 mg by mouth once a day 04/28/15   Lorretta Harp, MD  metFORMIN (GLUCOPHAGE) 500 MG tablet Take 1,000 mg by mouth 2 (two) times daily with a meal.     Historical Provider, MD  nitroGLYCERIN (NITROSTAT) 0.4 MG SL tablet Place 1 tablet (0.4 mg total) under the tongue every 5 (five) minutes x 3 doses as needed for chest pain. 03/03/16   Erma Heritage, PA  omega-3 acid ethyl esters (LOVAZA) 1 G capsule Take 1 g by mouth 2 (two) times daily.     Historical Provider, MD  pantoprazole (PROTONIX) 40 MG tablet Take 1 tablet (40 mg total) by mouth 2 (two) times daily. Take 40mg .   twice daily.  30 minutes before breakfast and 30 minutes before evening meal. Patient taking differently: Take 40 mg by mouth 2 (two) times daily.  12/21/15   Irene Shipper, MD  Phenylephrine-DM-GG-APAP (MUCINEX SINUS-MAX) 5-10-200-325 MG CAPS  Take 1 capsule by mouth 2 (two) times daily as needed (for symptoms).    Historical Provider, MD  polysaccharide iron (NIFEREX) 150 MG CAPS capsule Take 150 mg by mouth 2 (two) times daily.      Historical Provider, MD  pravastatin (PRAVACHOL) 40 MG tablet Take 0.5 tablets (20 mg total) by mouth at bedtime. 03/03/16   Erma Heritage, PA  timolol (TIMOPTIC) 0.5 % ophthalmic solution Place 1 drop into both eyes daily.  02/12/14   Historical Provider, MD  traMADol (ULTRAM) 50 MG tablet Take 50 mg by mouth every 8 (eight) hours as needed (for pain).  10/06/15   Historical Provider, MD    Family History Family History  Problem Relation Age of Onset  . Diabetes Mother   . Heart disease Mother   . Colon cancer Neg Hx   . Esophageal cancer Neg Hx   . Rectal cancer Neg Hx   . Stomach cancer Neg Hx     Social History Social History  Substance Use Topics  . Smoking status: Former Smoker    Packs/day: 2.00    Years: 45.00    Types: Cigarettes    Quit date: 1997  . Smokeless tobacco: Never Used     Comment: "quit smoking cigarettes ~ 1997"  . Alcohol use No     Allergies   Nsaids and Ambien [zolpidem]   Review of Systems Review of Systems  All other systems reviewed and are negative.    Physical Exam Updated Vital Signs BP 143/88   Pulse 76   Temp 98.8 F (37.1 C) (Oral)   Resp 16   Ht 5\' 8"  (1.727 m)   Wt 175 lb (79.4 kg)   SpO2 100%   BMI 26.61 kg/m   Physical Exam  Constitutional: He is oriented to person, place, and time. He appears well-developed and well-nourished.  HENT:  Head: Normocephalic and atraumatic.  Eyes: Conjunctivae are normal.  Neck: Neck supple.  Cardiovascular: Normal rate and regular rhythm.   Pulmonary/Chest: Effort normal and breath sounds normal.  Abdominal: Soft. Bowel sounds are normal.  Musculoskeletal: Normal range of motion.  Neurological: He is alert and oriented to person, place, and time.  Skin: Skin is warm and dry.    Psychiatric: He has a normal mood and affect. His behavior is normal.  Nursing note and vitals reviewed.    ED Treatments / Results  Labs (all labs ordered are listed, but only abnormal results are displayed) Labs Reviewed  BASIC METABOLIC PANEL - Abnormal; Notable for the following:       Result Value   Glucose, Bld 320 (*)    Creatinine, Ser 1.63 (*)    GFR calc non Af Amer 38 (*)    GFR calc Af Amer 44 (*)    All other components within normal limits  CBC - Abnormal; Notable for the following:    RBC 3.60 (*)    Hemoglobin 9.0 (*)    HCT 29.4 (*)    MCH 25.0 (*)    RDW 15.8 (*)    All other components within normal limits  TROPONIN I  I-STAT TROPOININ, ED    EKG  EKG Interpretation  Date/Time:  Sunday April 30 2016 20:33:09 EST Ventricular Rate:  100 PR Interval:  224 QRS Duration: 128 QT Interval:  354 QTC Calculation: 456 R Axis:   -13 Text Interpretation:  Sinus rhythm with 1st degree A-V block with occasional Premature ventricular complexes Left ventricular hypertrophy with QRS widening Lateral infarct , age undetermined Inferior infarct , age undetermined Abnormal ECG Confirmed by Malachi Kinzler  MD, Polo Mcmartin ([id number]) on 04/30/2016 9:13:39 PM       Radiology Dg Chest 2 View  Result Date: 04/30/2016 CLINICAL DATA:  Central chest pain. EXAM: CHEST  2 VIEW COMPARISON:  March 17, 2016 FINDINGS: The heart size and mediastinal contours are within normal limits. Both lungs are clear. The visualized skeletal structures are unremarkable. IMPRESSION: No active cardiopulmonary disease. Electronically Signed   By: Dorise Bullion III M.D   On: 04/30/2016 22:02    Procedures Procedures (including critical care time)  Medications Ordered in ED Medications  traMADol (ULTRAM) tablet 50 mg (50 mg Oral Given 04/30/16 2311)     Initial Impression / Assessment and Plan / ED Course  I have reviewed the triage vital signs and the nursing notes.  Pertinent labs & imaging results that  were available during my care of the patient were reviewed by me and considered in my medical decision making (  see chart for details).  Clinical Course     Patient has multiple health problems. He had a brief episode of chest pain several hours ago. I-STAT troponin is minimally elevated at 0.05.  Will discuss with cardiology.  2335:  Cardiology consult obtained. Patient looks good. He will be discharged home. He will fill his nitroglycerin prescription.  We did not think his minimally elevated troponin had clinical significance. Multiple troponins reviewed from the past blood work. Final Clinical Impressions(s) / ED Diagnoses   Final diagnoses:  Chest pain, unspecified type  Hyperglycemia  Renal insufficiency  Anemia, unspecified type    New Prescriptions New Prescriptions   No medications on file     Nat Christen, MD 04/30/16 [id number]    Nat Christen, MD 04/30/16 [place], MD 04/30/16 [id number]

## 2016-04-30 NOTE — ED Triage Notes (Signed)
Pt complaining of central chest pain that radiates to L arm. Pt states hx of same. Pt denies any SOB or dizziness.

## 2016-04-30 NOTE — Discharge Instructions (Signed)
You must fill your nitroglycerin prescription tomorrow. Follow-up your cardiologist next week or return if worse

## 2016-04-30 NOTE — ED Notes (Signed)
Nurse and EMT getting labs

## 2016-05-01 ENCOUNTER — Telehealth: Payer: Self-pay | Admitting: Cardiovascular Disease

## 2016-05-01 MED ORDER — NITROGLYCERIN 0.4 MG SL SUBL: 0.4000 mg | SUBLINGUAL_TABLET | SUBLINGUAL | 3 refills | Status: AC | PRN

## 2016-05-01 NOTE — Telephone Encounter (Signed)
New message  Pt daughter call.   *STAT* If patient is at the pharmacy, call can be transferred to refill team.   1. Which medications need to be refilled? (please list name of each medication and dose if known) Nitroglycerin 0.4mg    2. Which pharmacy/location (including street and city if local pharmacy) is medication to be sent to? [facility]   3. Do they need a 30 day or 90 day supply? 90  Per pt daughter pt is out of medication.

## 2016-05-01 NOTE — Telephone Encounter (Signed)
Rx(s) sent to pharmacy electronically.  

## 2016-05-03 ENCOUNTER — Encounter: Payer: Self-pay | Admitting: Cardiovascular Disease

## 2016-05-06 ENCOUNTER — Other Ambulatory Visit: Payer: Self-pay | Admitting: Cardiovascular Disease

## 2016-05-08 NOTE — Telephone Encounter (Signed)
Rx(s) sent to pharmacy electronically.  

## 2016-05-18 ENCOUNTER — Other Ambulatory Visit: Payer: Self-pay | Admitting: Hematology and Oncology

## 2016-05-18 DIAGNOSIS — D638 Anemia in other chronic diseases classified elsewhere: Secondary | ICD-10-CM

## 2016-05-18 DIAGNOSIS — D539 Nutritional anemia, unspecified: Secondary | ICD-10-CM

## 2016-05-31 ENCOUNTER — Telehealth: Payer: Self-pay | Admitting: Hematology and Oncology

## 2016-05-31 ENCOUNTER — Other Ambulatory Visit (HOSPITAL_BASED_OUTPATIENT_CLINIC_OR_DEPARTMENT_OTHER): Payer: Medicare Other

## 2016-05-31 ENCOUNTER — Ambulatory Visit (HOSPITAL_BASED_OUTPATIENT_CLINIC_OR_DEPARTMENT_OTHER): Payer: Medicare Other | Admitting: Hematology and Oncology

## 2016-05-31 DIAGNOSIS — D638 Anemia in other chronic diseases classified elsewhere: Secondary | ICD-10-CM | POA: Diagnosis not present

## 2016-05-31 DIAGNOSIS — D500 Iron deficiency anemia secondary to blood loss (chronic): Secondary | ICD-10-CM | POA: Diagnosis present

## 2016-05-31 DIAGNOSIS — Z87891 Personal history of nicotine dependence: Secondary | ICD-10-CM | POA: Diagnosis not present

## 2016-05-31 DIAGNOSIS — N183 Chronic kidney disease, stage 3 unspecified: Secondary | ICD-10-CM

## 2016-05-31 DIAGNOSIS — K208 Other esophagitis without bleeding: Secondary | ICD-10-CM

## 2016-05-31 DIAGNOSIS — D509 Iron deficiency anemia, unspecified: Secondary | ICD-10-CM | POA: Diagnosis present

## 2016-05-31 DIAGNOSIS — D539 Nutritional anemia, unspecified: Secondary | ICD-10-CM

## 2016-05-31 LAB — CBC & DIFF AND RETIC
BASO%: 0.2 % (ref 0.0–2.0)
Basophils Absolute: 0 10*3/uL (ref 0.0–0.1)
EOS ABS: 0.5 10*3/uL (ref 0.0–0.5)
EOS%: 9.3 % — ABNORMAL HIGH (ref 0.0–7.0)
HCT: 31 % — ABNORMAL LOW (ref 38.4–49.9)
HEMOGLOBIN: 9.3 g/dL — AB (ref 13.0–17.1)
IMMATURE RETIC FRACT: 20.6 % — AB (ref 3.00–10.60)
LYMPH#: 1.2 10*3/uL (ref 0.9–3.3)
LYMPH%: 20.9 % (ref 14.0–49.0)
MCH: 24.5 pg — AB (ref 27.2–33.4)
MCHC: 30 g/dL — ABNORMAL LOW (ref 32.0–36.0)
MCV: 81.6 fL (ref 79.3–98.0)
MONO#: 0.6 10*3/uL (ref 0.1–0.9)
MONO%: 10.5 % (ref 0.0–14.0)
NEUT%: 59.1 % (ref 39.0–75.0)
NEUTROS ABS: 3.3 10*3/uL (ref 1.5–6.5)
Platelets: 191 10*3/uL (ref 140–400)
RBC: 3.8 10*6/uL — ABNORMAL LOW (ref 4.20–5.82)
RDW: 16 % — AB (ref 11.0–14.6)
RETIC %: 1.29 % (ref 0.80–1.80)
Retic Ct Abs: 49.02 10*3/uL (ref 34.80–93.90)
WBC: 5.6 10*3/uL (ref 4.0–10.3)

## 2016-05-31 LAB — IRON AND TIBC
%SAT: 12 % — ABNORMAL LOW (ref 20–55)
IRON: 58 ug/dL (ref 42–163)
TIBC: 479 ug/dL — ABNORMAL HIGH (ref 202–409)
UIBC: 421 ug/dL — AB (ref 117–376)

## 2016-05-31 LAB — FERRITIN: FERRITIN: 50 ng/mL (ref 22–316)

## 2016-05-31 MED ORDER — TRAMADOL HCL 50 MG PO TABS: 50.0000 mg | ORAL_TABLET | Freq: Three times a day (TID) | ORAL | 0 refills | Status: AC | PRN

## 2016-05-31 NOTE — Telephone Encounter (Signed)
Appointments scheduled per 2/7 LOS. Patient given AVS report and calendars with future scheduled appointments. °

## 2016-06-01 ENCOUNTER — Encounter: Payer: Self-pay | Admitting: Hematology and Oncology

## 2016-06-01 LAB — VITAMIN B12: VITAMIN B 12: 354 pg/mL (ref 232–1245)

## 2016-06-01 LAB — ERYTHROPOIETIN: Erythropoietin: 60.8 m[IU]/mL — ABNORMAL HIGH (ref 2.6–18.5)

## 2016-06-01 LAB — SEDIMENTATION RATE: SED RATE: 19 mm/h (ref 0–30)

## 2016-06-01 NOTE — Assessment & Plan Note (Addendum)
This is multifactorial, likely related to his age and cardiac issues Continue medical management.

## 2016-06-01 NOTE — Progress Notes (Signed)
[facility] CONSULT NOTE  Patient Care Team: [name], PA-C as PCP - General (Physician Assistant)  CHIEF COMPLAINTS/PURPOSE OF CONSULTATION:  Chronic iron deficiency anemia, likely due to antiplatelet agents and peptic ulcer disease  HISTORY OF PRESENTING ILLNESS:  Kevin Rhodes 83 y.o. male is here because of persistent, recurrent iron deficiency anemia. The patient was seen in the cancer center for to 5 years ago for similar reasons. He was lost to follow-up over 3 years ago. He had recurrent admissions to the hospital for cardiac issues. He had multiple stents placed for his heart. The patient is also noted to have chronic renal failure. He denies ingestion of NSAID. He had received blood transfusions in the past His recent blood count for the past few months have been under 10 g. He was instructed to take oral iron supplement but tolerated that very poorly with severe constipation. He complained of excessive fatigue and leg cramps but denies chest pain, dizziness or shortness of breath recently.  He had not noticed any recent bleeding such as epistaxis, hematuria or hematochezia His last colonoscopy was in October 2012 which showed diverticulosis and internal hemorrhoids. Upper endoscopy evaluation on 12/21/2015 shows significant esophagitis  He denies any pica and eats a variety of diet.The patient was prescribed oral iron supplements and he takes twice a day but has poor tolerance due to constipation  MEDICAL HISTORY:  Past Medical History:  Diagnosis Date  . Allergic rhinitis   . Anemia   . Arthritis   . Benign neoplasm of colon   . Blood transfusion without reported diagnosis   . CAD (coronary artery disease)    a. DES to RCA and LAD in 2012  b. cath 02/2016: 70% ost RCA stenosis and 80% distal RCA stenosis, treated with placement of 2 Synergy DES  . Calculus of bile duct without mention of cholecystitis or obstruction   . Depression    after wife  passed away  . Diabetes mellitus    "pills"  . Diverticulosis of colon (without mention of hemorrhage)   . Esophageal reflux   . Hemorrhoids   . Hyperlipidemia   . Hypertension   . Internal hemorrhoids without mention of complication   . Kidney stones   . Myocardial infarction 09/1995  . NSTEMI (non-ST elevated myocardial infarction) ([place]) 03/02/2006  . Osteoporosis   . Other and unspecified hyperlipidemia   . Peptic ulcer, unspecified site, unspecified as acute or chronic, without mention of hemorrhage, perforation, or obstruction   . Pneumonia   . Shortness of breath 03/06/11    "w/exertion"  . Stricture and stenosis of esophagus   . Unspecified sinusitis (chronic)     SURGICAL HISTORY: Past Surgical History:  Procedure Laterality Date  . CARDIAC CATHETERIZATION N/A 03/02/2016   Procedure: Left Heart Cath and Coronary Angiography;  Surgeon: Lorretta Harp, MD;  Location: [facility];  Service: Cardiovascular;  Laterality: N/A;  . CARDIAC CATHETERIZATION N/A 03/02/2016   Procedure: Coronary Stent Intervention;  Surgeon: Lorretta Harp, MD;  Location: [facility];  Service: Cardiovascular;  Laterality: N/A;  . CATARACT EXTRACTION W/ INTRAOCULAR LENS  IMPLANT, BILATERAL Bilateral   . COLONOSCOPY  01/26/11   severe left diverticulosis, internal hemorrhoids  . CORONARY ANGIOPLASTY WITH STENT PLACEMENT  01/20/2011   "2"  . [facility]   '1"  . CORONARY ANGIOPLASTY WITH STENT PLACEMENT  03/02/2016  . DOPPLER ECHOCARDIOGRAPHY  2012, 2006  . ESOPHAGOGASTRODUODENOSCOPY  01/26/11   esophageal stricture, antral ulcers - H. pylori bx negative  . stress myocardial perfusion  2011  . UPPER GASTROINTESTINAL ENDOSCOPY    . vascular uktrasound      SOCIAL HISTORY: Social History   Social History  . Marital status: Widowed    Spouse name: N/A  . Number of children: 3  . Years of education: N/A   Occupational History  . retired     Social History Main Topics  . Smoking status: Former Smoker    Packs/day: 2.00    Years: 45.00    Types: Cigarettes    Quit date: 1997  . Smokeless tobacco: Never Used     Comment: "quit smoking cigarettes ~ 1997"  . Alcohol use No  . Drug use: No  . Sexual activity: No   Other Topics Concern  . Not on file   Social History Narrative  . No narrative on file    FAMILY HISTORY: Family History  Problem Relation Age of Onset  . Diabetes Mother   . Heart disease Mother   . Colon cancer Neg Hx   . Esophageal cancer Neg Hx   . Rectal cancer Neg Hx   . Stomach cancer Neg Hx     ALLERGIES:  is allergic to nsaids and ambien [zolpidem].  MEDICATIONS:  Current Outpatient Prescriptions  Medication Sig Dispense Refill  . glimepiride (AMARYL) 2 MG tablet Take 2 mg by mouth daily.     . hydrochlorothiazide (MICROZIDE) 12.5 MG capsule TAKE ONE CAPSULE BY MOUTH EVERY DAY (Patient taking differently: Take 12.5 mg by mouth once a day) 90 capsule 2  . HYDROcodone-homatropine (HYCODAN) 5-1.5 MG/5ML syrup Take 5 mLs by mouth as needed.  0  . irbesartan (AVAPRO) 300 MG tablet TAKE 1 TABLET (300 MG TOTAL) BY MOUTH AT BEDTIME. 30 tablet 8  . isosorbide mononitrate (IMDUR) 30 MG 24 hr tablet Take 1 tablet (30 mg total) by mouth daily. 15 tablet 10  . metFORMIN (GLUCOPHAGE) 500 MG tablet Take 1,000 mg by mouth 2 (two) times daily with a meal.     . nitroGLYCERIN (NITROSTAT) 0.4 MG SL tablet Place 1 tablet (0.4 mg total) under the tongue every 5 (five) minutes x 3 doses as needed for chest pain. 25 tablet 3  . omega-3 acid ethyl esters (LOVAZA) 1 G capsule Take 1 g by mouth 2 (two) times daily.     . pantoprazole (PROTONIX) 40 MG tablet Take 1 tablet (40 mg total) by mouth 2 (two) times daily. Take 40mg .   twice daily.  30 minutes before breakfast and 30 minutes before evening meal. (Patient taking differently: Take 40 mg by mouth 2 (two) times daily. ) 60 tablet 11  . Phenylephrine-DM-GG-APAP  (MUCINEX SINUS-MAX) 5-10-200-325 MG CAPS Take 1 capsule by mouth 2 (two) times daily as needed (for symptoms).    . polysaccharide iron (NIFEREX) 150 MG CAPS capsule Take 150 mg by mouth 2 (two) times daily.      . pravastatin (PRAVACHOL) 40 MG tablet Take 0.5 tablets (20 mg total) by mouth at bedtime. 30 tablet 6  . timolol (TIMOPTIC) 0.5 % ophthalmic solution Place 1 drop into both eyes daily.     . traMADol (ULTRAM) 50 MG tablet Take 1 tablet (50 mg total) by mouth every 8 (eight) hours as needed (for pain). 30 tablet 0  . AMBULATORY NON FORMULARY MEDICATION Take 90 mg by mouth 2 (two) times daily. Medication Name: BRILINTA 90 mg BID (TWILIGHT Research Study PROVIDED)    .  AMBULATORY NON FORMULARY MEDICATION Take 81 mg by mouth daily. Medication Name: Aspirin 81 mg daily (TWILIGHT Research Study PROVIDED) (Patient taking differently: Take 81 mg by mouth every morning. Medication Name: Aspirin 81 mg daily (TWILIGHT Research Study PROVIDED))     No current facility-administered medications for this visit.     REVIEW OF SYSTEMS:   Constitutional: Denies fevers, chills or abnormal night sweats Eyes: Denies blurriness of vision, double vision or watery eyes Ears, nose, mouth, throat, and face: Denies mucositis or sore throat Respiratory: Denies cough, dyspnea or wheezes Cardiovascular: Denies palpitation, chest discomfort or lower extremity swelling Gastrointestinal:  Denies nausea, heartburn or change in bowel habits Skin: Denies abnormal skin rashes Lymphatics: Denies new lymphadenopathy or easy bruising Neurological:Denies numbness, tingling or new weaknesses Behavioral/Psych: Mood is stable, no new changes  All other systems were reviewed with the patient and are negative.  PHYSICAL EXAMINATION: ECOG PERFORMANCE STATUS: 1 - Symptomatic but completely ambulatory  Vitals:   05/31/16 1033 05/31/16 1034  BP: (!) 151/117 (!) 171/75  Pulse: 64   Resp: 17   Temp: 97.7 F (36.5 C)     Filed Weights   05/31/16 1033  Weight: 184 lb 8 oz (83.7 kg)    GENERAL:alert, no distress and comfortable SKIN: skin color, texture, turgor are normal, no rashes or significant lesions EYES: normal, conjunctiva are pink and non-injected, sclera clear OROPHARYNX:no exudate, no erythema and lips, buccal mucosa, and tongue normal  NECK: supple, thyroid normal size, non-tender, without nodularity LYMPH:  no palpable lymphadenopathy in the cervical, axillary or inguinal LUNGS: clear to auscultation and percussion with normal breathing effort HEART: regular rate & rhythm and no murmurs and no lower extremity edema ABDOMEN:abdomen soft, non-tender and normal bowel sounds Musculoskeletal:no cyanosis of digits and no clubbing  PSYCH: alert & oriented x 3 with fluent speech NEURO: no focal motor/sensory deficits  ASSESSMENT & PLAN:   Iron deficiency anemia due to chronic blood loss The most likely cause of his anemia is due to chronic blood loss/malabsorption syndrome. We discussed some of the risks, benefits, and alternatives of intravenous iron infusions. The patient is symptomatic from anemia and the iron level is critically low. He tolerated oral iron supplement poorly and desires to achieved higher levels of iron faster for adequate hematopoesis. Some of the side-effects to be expected including risks of infusion reactions, phlebitis, headaches, nausea and fatigue.  The patient is willing to proceed. Patient education material was dispensed.  Goal is to keep ferritin level greater than 100 With his history of noncompliance, after 2 doses of IV iron, I plan to see him back within a month after IV iron There is also a component of anemia of chronic illness secondary to chronic kidney failure. My goal would be to keep his blood count greater than 10 and if needed, he would benefit from ESA in the future   Chronic kidney disease, stage III (moderate) This is multifactorial, likely related  to his age and cardiac issues Continue medical management.  EROSIVE ESOPHAGITIS The patient have chronic esophagitis. I reinforced the importance of taking proton pump inhibitor on a regular basis and avoid NSAID  Orders Placed This Encounter  Procedures  . CBC & Diff and Retic    Standing Status:   Future    Standing Expiration Date:   07/05/2017  . Ferritin    Standing Status:   Future    Standing Expiration Date:   05/31/2017     All questions were answered. The  patient knows to call the clinic with any problems, questions or concerns. I spent 40 minutes counseling the patient face to face. The total time spent in the appointment was 55 minutes and more than 50% was on counseling.     Heath Lark, MD 06/01/16 7:31 AM

## 2016-06-01 NOTE — Assessment & Plan Note (Signed)
The most likely cause of his anemia is due to chronic blood loss/malabsorption syndrome. We discussed some of the risks, benefits, and alternatives of intravenous iron infusions. The patient is symptomatic from anemia and the iron level is critically low. He tolerated oral iron supplement poorly and desires to achieved higher levels of iron faster for adequate hematopoesis. Some of the side-effects to be expected including risks of infusion reactions, phlebitis, headaches, nausea and fatigue.  The patient is willing to proceed. Patient education material was dispensed.  Goal is to keep ferritin level greater than 100 With his history of noncompliance, after 2 doses of IV iron, I plan to see him back within a month after IV iron There is also a component of anemia of chronic illness secondary to chronic kidney failure. My goal would be to keep his blood count greater than 10 and if needed, he would benefit from ESA in the future

## 2016-06-01 NOTE — Assessment & Plan Note (Signed)
The patient have chronic esophagitis. I reinforced the importance of taking proton pump inhibitor on a regular basis and avoid NSAID

## 2016-06-05 ENCOUNTER — Telehealth: Payer: Self-pay | Admitting: *Deleted

## 2016-06-05 NOTE — Telephone Encounter (Signed)
Left Message for patient to call research office about 3 month randomization appointment. I also know he has had some issues with hgb and blood transfusions. I do not know if he is wanting to continue with participating in the study.

## 2016-06-08 ENCOUNTER — Ambulatory Visit (HOSPITAL_BASED_OUTPATIENT_CLINIC_OR_DEPARTMENT_OTHER): Payer: Medicare Other

## 2016-06-08 VITALS — BP 171/82 | HR 67 | Temp 97.9°F | Resp 20

## 2016-06-08 DIAGNOSIS — D500 Iron deficiency anemia secondary to blood loss (chronic): Secondary | ICD-10-CM | POA: Diagnosis present

## 2016-06-08 MED ORDER — FERUMOXYTOL INJECTION 510 MG/17 ML
510.0000 mg | Freq: Once | INTRAVENOUS | Status: AC
  Administered 2016-06-08: 510 mg via INTRAVENOUS
  Filled 2016-06-08: qty 17

## 2016-06-08 MED ORDER — SODIUM CHLORIDE 0.9 % IV SOLN
Freq: Once | INTRAVENOUS | Status: AC
  Administered 2016-06-08: 09:00:00 via INTRAVENOUS

## 2016-06-08 NOTE — Patient Instructions (Signed)
Ferumoxytol injection What is this medicine? FERUMOXYTOL is an iron complex. Iron is used to make healthy red blood cells, which carry oxygen and nutrients throughout the body. This medicine is used to treat iron deficiency anemia in people with chronic kidney disease. COMMON BRAND NAME(S): Feraheme What should I tell my health care provider before I take this medicine? They need to know if you have any of these conditions: -anemia not caused by low iron levels -high levels of iron in the blood -magnetic resonance imaging (MRI) test scheduled -an unusual or allergic reaction to iron, other medicines, foods, dyes, or preservatives -pregnant or trying to get pregnant -breast-feeding How should I use this medicine? This medicine is for injection into a vein. It is given by a health care professional in a hospital or clinic setting. Talk to your pediatrician regarding the use of this medicine in children. Special care may be needed. What if I miss a dose? It is important not to miss your dose. Call your doctor or health care professional if you are unable to keep an appointment. What may interact with this medicine? This medicine may interact with the following medications: -other iron products What should I watch for while using this medicine? Visit your doctor or healthcare professional regularly. Tell your doctor or healthcare professional if your symptoms do not start to get better or if they get worse. You may need blood work done while you are taking this medicine. You may need to follow a special diet. Talk to your doctor. Foods that contain iron include: whole grains/cereals, dried fruits, beans, or peas, leafy green vegetables, and organ meats (liver, kidney). What side effects may I notice from receiving this medicine? Side effects that you should report to your doctor or health care professional as soon as possible: -allergic reactions like skin rash, itching or hives, swelling of the  face, lips, or tongue -breathing problems -changes in blood pressure -feeling faint or lightheaded, falls -fever or chills -flushing, sweating, or hot feelings -swelling of the ankles or feet Side effects that usually do not require medical attention (report to your doctor or health care professional if they continue or are bothersome): -diarrhea -headache -nausea, vomiting -stomach pain Where should I keep my medicine? This drug is given in a hospital or clinic and will not be stored at home.  2017 Elsevier/Gold Standard (2015-05-13 12:41:49)  

## 2016-06-14 ENCOUNTER — Telehealth: Payer: Self-pay | Admitting: *Deleted

## 2016-06-14 ENCOUNTER — Encounter (HOSPITAL_COMMUNITY): Payer: Self-pay | Admitting: Physician Assistant

## 2016-06-14 ENCOUNTER — Other Ambulatory Visit: Payer: Self-pay | Admitting: *Deleted

## 2016-06-14 NOTE — Progress Notes (Signed)
Mailed patient letter with information about Cardiac Rehab program. MW °

## 2016-06-14 NOTE — Telephone Encounter (Signed)
Called patient this morning after speaking with Dr. Gwenlyn Found about his continued health issues and randomization appointment today at 1 pm for TWILIGHT research study. I explained to patient that he did not have to come to the research office today. The decision was made to not randomize and discontinue  the TWILIGHT research study.  He has a "few" more days of ASA and Brilinta that was provided by the research study and will need a prescription sent to his pharmacy. I explained this to him and instructed him the importance of DAPT. Patient verbalized understanding. I will follow up in a few days via phone.

## 2016-06-15 ENCOUNTER — Ambulatory Visit (HOSPITAL_BASED_OUTPATIENT_CLINIC_OR_DEPARTMENT_OTHER): Payer: Medicare Other

## 2016-06-15 VITALS — BP 110/76 | HR 79 | Temp 98.2°F | Resp 16

## 2016-06-15 DIAGNOSIS — D500 Iron deficiency anemia secondary to blood loss (chronic): Secondary | ICD-10-CM

## 2016-06-15 MED ORDER — SODIUM CHLORIDE 0.9 % IV SOLN
Freq: Once | INTRAVENOUS | Status: AC
  Administered 2016-06-15: 13:00:00 via INTRAVENOUS

## 2016-06-15 MED ORDER — SODIUM CHLORIDE 0.9 % IV SOLN
510.0000 mg | Freq: Once | INTRAVENOUS | Status: AC
  Administered 2016-06-15: 510 mg via INTRAVENOUS
  Filled 2016-06-15: qty 17

## 2016-06-15 NOTE — Patient Instructions (Signed)
Ferumoxytol injection What is this medicine? FERUMOXYTOL is an iron complex. Iron is used to make healthy red blood cells, which carry oxygen and nutrients throughout the body. This medicine is used to treat iron deficiency anemia in people with chronic kidney disease. COMMON BRAND NAME(S): Feraheme What should I tell my health care provider before I take this medicine? They need to know if you have any of these conditions: -anemia not caused by low iron levels -high levels of iron in the blood -magnetic resonance imaging (MRI) test scheduled -an unusual or allergic reaction to iron, other medicines, foods, dyes, or preservatives -pregnant or trying to get pregnant -breast-feeding How should I use this medicine? This medicine is for injection into a vein. It is given by a health care professional in a hospital or clinic setting. Talk to your pediatrician regarding the use of this medicine in children. Special care may be needed. What if I miss a dose? It is important not to miss your dose. Call your doctor or health care professional if you are unable to keep an appointment. What may interact with this medicine? This medicine may interact with the following medications: -other iron products What should I watch for while using this medicine? Visit your doctor or healthcare professional regularly. Tell your doctor or healthcare professional if your symptoms do not start to get better or if they get worse. You may need blood work done while you are taking this medicine. You may need to follow a special diet. Talk to your doctor. Foods that contain iron include: whole grains/cereals, dried fruits, beans, or peas, leafy green vegetables, and organ meats (liver, kidney). What side effects may I notice from receiving this medicine? Side effects that you should report to your doctor or health care professional as soon as possible: -allergic reactions like skin rash, itching or hives, swelling of the  face, lips, or tongue -breathing problems -changes in blood pressure -feeling faint or lightheaded, falls -fever or chills -flushing, sweating, or hot feelings -swelling of the ankles or feet Side effects that usually do not require medical attention (report to your doctor or health care professional if they continue or are bothersome): -diarrhea -headache -nausea, vomiting -stomach pain Where should I keep my medicine? This drug is given in a hospital or clinic and will not be stored at home.  2017 Elsevier/Gold Standard (2015-05-13 12:41:49)  

## 2016-06-19 ENCOUNTER — Telehealth: Payer: Self-pay | Admitting: Hematology and Oncology

## 2016-06-19 NOTE — Telephone Encounter (Signed)
Routing to Dr Gwenlyn Found for advice on DAPT.

## 2016-06-19 NOTE — Telephone Encounter (Signed)
Rrescheduled per patient's daughter, Kevin Rhodes/Kevin Rhodes. 06/19/16

## 2016-06-21 MED ORDER — ASPIRIN EC 81 MG PO TBEC: 81.0000 mg | DELAYED_RELEASE_TABLET | Freq: Every day | ORAL | 3 refills | Status: AC

## 2016-06-21 MED ORDER — TICAGRELOR 90 MG PO TABS: 90.0000 mg | ORAL_TABLET | Freq: Two times a day (BID) | ORAL | 3 refills | Status: AC

## 2016-06-21 NOTE — Addendum Note (Signed)
Addended by: Vanessa Ralphs on: 06/21/2016 05:03 PM   Modules accepted: Orders

## 2016-06-21 NOTE — Telephone Encounter (Signed)
Brilenta 90 mg by mouth twice a day and addition to low-dose aspirin

## 2016-06-21 NOTE — Telephone Encounter (Signed)
S/w patient. He verbalized instructions on taking Brilinta and Aspirin. He will go to pharmacy to pick up Brilinta and get Aspirin over the counter. He is aware of his upcoming appt with Dr Gwenlyn Found on 07/18/16.

## 2016-06-22 ENCOUNTER — Telehealth: Payer: Self-pay | Admitting: Cardiovascular Disease

## 2016-06-22 NOTE — Telephone Encounter (Signed)
New Message  Pts daughter voice she would like to speak in regards to nurses call to her dad yesterday from nurse.  Please f/u

## 2016-06-22 NOTE — Telephone Encounter (Signed)
Returned call to daughter (ok per DPR)-patient is confused about conversation with the nurse yesterday.  Advised per telephone note:   Vanessa Ralphs, RN     S/w patient. He verbalized instructions on taking Brilinta and Aspirin. He will go to pharmacy to pick up Brilinta and get Aspirin over the counter. He is aware of his upcoming appt with Dr Gwenlyn Found on 07/18/16       Daughter concerned about cost-unsure how much it will be.  Advised to return call if unable to afford and we will see if we can help out or find alternative.

## 2016-06-30 ENCOUNTER — Telehealth: Payer: Self-pay | Admitting: Cardiovascular Disease

## 2016-06-30 NOTE — Telephone Encounter (Signed)
Spoke with daughter informed her to go to [email or web address] to print out a co-pay card/pt assistance or anything that she may need. Also put samples at the front desk for pt to pick up until she can do the pt assistance program.

## 2016-06-30 NOTE — Telephone Encounter (Signed)
New message    Pt daughter is calling about pt brilinta. He states it's a little more than he can afford and is asking if there is coupons or anything that could help them.

## 2016-07-11 ENCOUNTER — Ambulatory Visit: Payer: Medicare Other | Admitting: Cardiovascular Disease

## 2016-07-11 ENCOUNTER — Ambulatory Visit: Payer: Medicare Other | Admitting: Hematology and Oncology

## 2016-07-11 ENCOUNTER — Other Ambulatory Visit: Payer: Medicare Other

## 2016-07-17 ENCOUNTER — Other Ambulatory Visit (HOSPITAL_BASED_OUTPATIENT_CLINIC_OR_DEPARTMENT_OTHER): Payer: Medicare Other

## 2016-07-17 ENCOUNTER — Ambulatory Visit (HOSPITAL_BASED_OUTPATIENT_CLINIC_OR_DEPARTMENT_OTHER): Payer: Medicare Other | Admitting: Hematology and Oncology

## 2016-07-17 ENCOUNTER — Telehealth: Payer: Self-pay | Admitting: Hematology and Oncology

## 2016-07-17 ENCOUNTER — Encounter: Payer: Self-pay | Admitting: Hematology and Oncology

## 2016-07-17 VITALS — BP 145/64 | HR 60 | Temp 98.4°F | Resp 18 | Ht 68.0 in | Wt 186.5 lb

## 2016-07-17 DIAGNOSIS — D500 Iron deficiency anemia secondary to blood loss (chronic): Secondary | ICD-10-CM

## 2016-07-17 DIAGNOSIS — N183 Chronic kidney disease, stage 3 unspecified: Secondary | ICD-10-CM

## 2016-07-17 DIAGNOSIS — R05 Cough: Secondary | ICD-10-CM | POA: Insufficient documentation

## 2016-07-17 DIAGNOSIS — D539 Nutritional anemia, unspecified: Secondary | ICD-10-CM

## 2016-07-17 DIAGNOSIS — D631 Anemia in chronic kidney disease: Secondary | ICD-10-CM

## 2016-07-17 DIAGNOSIS — R059 Cough, unspecified: Secondary | ICD-10-CM | POA: Insufficient documentation

## 2016-07-17 LAB — CBC & DIFF AND RETIC
BASO%: 0.4 % (ref 0.0–2.0)
Basophils Absolute: 0 10*3/uL (ref 0.0–0.1)
EOS ABS: 0.5 10*3/uL (ref 0.0–0.5)
EOS%: 9.5 % — ABNORMAL HIGH (ref 0.0–7.0)
HCT: 32.4 % — ABNORMAL LOW (ref 38.4–49.9)
HGB: 10.4 g/dL — ABNORMAL LOW (ref 13.0–17.1)
Immature Retic Fract: 13.6 % — ABNORMAL HIGH (ref 3.00–10.60)
LYMPH%: 23.9 % (ref 14.0–49.0)
MCH: 27.9 pg (ref 27.2–33.4)
MCHC: 32.1 g/dL (ref 32.0–36.0)
MCV: 86.9 fL (ref 79.3–98.0)
MONO#: 0.4 10*3/uL (ref 0.1–0.9)
MONO%: 8.9 % (ref 0.0–14.0)
NEUT%: 57.3 % (ref 39.0–75.0)
NEUTROS ABS: 2.8 10*3/uL (ref 1.5–6.5)
Platelets: 156 10*3/uL (ref 140–400)
RBC: 3.73 10*6/uL — AB (ref 4.20–5.82)
RDW: 21.7 % — ABNORMAL HIGH (ref 11.0–14.6)
Retic %: 1.69 % (ref 0.80–1.80)
Retic Ct Abs: 63.04 10*3/uL (ref 34.80–93.90)
WBC: 4.9 10*3/uL (ref 4.0–10.3)
lymph#: 1.2 10*3/uL (ref 0.9–3.3)

## 2016-07-17 LAB — FERRITIN: Ferritin: 168 ng/ml (ref 22–316)

## 2016-07-17 MED ORDER — GUAIFENESIN-CODEINE 100-10 MG/5ML PO SOLN: 5.0000 mL | Freq: Three times a day (TID) | ORAL | 0 refills | Status: AC | PRN

## 2016-07-17 NOTE — Progress Notes (Signed)
[facility] OFFICE PROGRESS NOTE  BEAL, SHERI, PA-C SUMMARY OF HEMATOLOGIC HISTORY:  Kevin Rhodes 83 y.o. male is here because of persistent, recurrent iron deficiency anemia. The patient was seen in the cancer center for to 5 years ago for similar reasons. He was lost to follow-up over 3 years ago. He had recurrent admissions to the hospital for cardiac issues. He had multiple stents placed for his heart. The patient is also noted to have chronic renal failure. He denies ingestion of NSAID. He had received blood transfusions in the past His recent blood count for the past few months have been under 10 g. He was instructed to take oral iron supplement but tolerated that very poorly with severe constipation. He complained of excessive fatigue and leg cramps but denies chest pain, dizziness or shortness of breath recently.  He had not noticed any recent bleeding such as epistaxis, hematuria or hematochezia His last colonoscopy was in October 2012 which showed diverticulosis and internal hemorrhoids. Upper endoscopy evaluation on 12/21/2015 shows significant esophagitis  He denies any pica and eats a variety of diet.The patient was prescribed oral iron supplements and he takes twice a day but has poor tolerance due to constipation The patient received intravenous iron in February 2018 INTERVAL HISTORY: Kevin Rhodes 83 y.o. male returns for follow-up with his daughter. He felt better since intravenous iron replacement therapy. His symptoms of leg cramps and restless leg had resolved.  He complained of mild fatigue. The patient denies any recent signs or symptoms of bleeding such as spontaneous epistaxis, hematuria or hematochezia.   I have reviewed the past medical history, past surgical history, social history and family history with the patient and they are unchanged from previous note.  ALLERGIES:  is allergic to nsaids and ambien [zolpidem].  MEDICATIONS:   Current Outpatient Prescriptions  Medication Sig Dispense Refill  . aspirin EC 81 MG tablet Take 1 tablet (81 mg total) by mouth daily. 90 tablet 3  . glimepiride (AMARYL) 2 MG tablet Take 2 mg by mouth daily.     . hydrochlorothiazide (MICROZIDE) 12.5 MG capsule TAKE ONE CAPSULE BY MOUTH EVERY DAY (Patient taking differently: Take 12.5 mg by mouth once a day) 90 capsule 2  . irbesartan (AVAPRO) 300 MG tablet TAKE 1 TABLET (300 MG TOTAL) BY MOUTH AT BEDTIME. 30 tablet 8  . isosorbide mononitrate (IMDUR) 30 MG 24 hr tablet Take 1 tablet (30 mg total) by mouth daily. 15 tablet 10  . metFORMIN (GLUCOPHAGE) 500 MG tablet Take 1,000 mg by mouth 2 (two) times daily with a meal.     . omega-3 acid ethyl esters (LOVAZA) 1 G capsule Take 1 g by mouth 2 (two) times daily.     . pantoprazole (PROTONIX) 40 MG tablet Take 1 tablet (40 mg total) by mouth 2 (two) times daily. Take 40mg .   twice daily.  30 minutes before breakfast and 30 minutes before evening meal. (Patient taking differently: Take 40 mg by mouth 2 (two) times daily. ) 60 tablet 11  . polysaccharide iron (NIFEREX) 150 MG CAPS capsule Take 150 mg by mouth 2 (two) times daily.      . pravastatin (PRAVACHOL) 40 MG tablet Take 0.5 tablets (20 mg total) by mouth at bedtime. 30 tablet 6  . ticagrelor (BRILINTA) 90 MG TABS tablet Take 1 tablet (90 mg total) by mouth 2 (two) times daily. 180 tablet 3  . timolol (TIMOPTIC) 0.5 % ophthalmic solution Place 1 drop into  both eyes daily.     . traMADol (ULTRAM) 50 MG tablet Take 1 tablet (50 mg total) by mouth every 8 (eight) hours as needed (for pain). 30 tablet 0  . dicyclomine (BENTYL) 10 MG capsule TAKE ONE CAPSULE BY MOUTH EVERY 6 HOURS AS NEEDED FOR ABDOMINAL PAIN/SPASMS    . guaiFENesin-codeine 100-10 MG/5ML syrup Take 5 mLs by mouth 3 (three) times daily as needed for cough. 180 mL 0  . HYDROcodone-homatropine (HYCODAN) 5-1.5 MG/5ML syrup Take 5 mLs by mouth as needed.  0  . nitroGLYCERIN (NITROSTAT)  0.4 MG SL tablet Place 1 tablet (0.4 mg total) under the tongue every 5 (five) minutes x 3 doses as needed for chest pain. (Patient not taking: Reported on 07/17/2016) 25 tablet 3  . Phenylephrine-DM-GG-APAP (MUCINEX SINUS-MAX) 5-10-200-325 MG CAPS Take 1 capsule by mouth 2 (two) times daily as needed (for symptoms).     No current facility-administered medications for this visit.      REVIEW OF SYSTEMS:   Constitutional: Denies fevers, chills or night sweats Eyes: Denies blurriness of vision Ears, nose, mouth, throat, and face: Denies mucositis or sore throat Respiratory: Denies cough, dyspnea or wheezes Cardiovascular: Denies palpitation, chest discomfort or lower extremity swelling Gastrointestinal:  Denies nausea, heartburn or change in bowel habits Skin: Denies abnormal skin rashes Lymphatics: Denies new lymphadenopathy or easy bruising Neurological:Denies numbness, tingling or new weaknesses Behavioral/Psych: Mood is stable, no new changes  All other systems were reviewed with the patient and are negative.  PHYSICAL EXAMINATION: ECOG PERFORMANCE STATUS: 1 - Symptomatic but completely ambulatory  Vitals:   07/17/16 0857  BP: (!) 145/64  Pulse: 60  Resp: 18  Temp: 98.4 F (36.9 C)   Filed Weights   07/17/16 0857  Weight: 186 lb 8 oz (84.6 kg)    GENERAL:alert, no distress and comfortable SKIN: skin color, texture, turgor are normal, no rashes or significant lesions EYES: normal, Conjunctiva are pink and non-injected, sclera clear Musculoskeletal:no cyanosis of digits and no clubbing  NEURO: alert & oriented x 3 with fluent speech, no focal motor/sensory deficits  LABORATORY DATA:  I have reviewed the data as listed     Component Value Date/Time   NA 140 04/30/2016 2125   NA 145 05/26/2013 1357   K 3.6 04/30/2016 2125   K 4.1 05/26/2013 1357   CL 106 04/30/2016 2125   CO2 22 04/30/2016 2125   CO2 22 05/26/2013 1357   GLUCOSE 320 (H) 04/30/2016 2125   GLUCOSE  128 05/26/2013 1357   BUN 15 04/30/2016 2125   BUN 19.8 05/26/2013 1357   CREATININE 1.63 (H) 04/30/2016 2125   CREATININE 1.60 (H) 03/31/2016 1024   CREATININE 1.4 (H) 05/26/2013 1357   CALCIUM 9.0 04/30/2016 2125   CALCIUM 9.7 05/26/2013 1357   PROT 6.5 03/17/2016 1513   PROT 6.9 05/26/2013 1357   ALBUMIN 3.4 (L) 03/17/2016 1513   ALBUMIN 3.6 05/26/2013 1357   AST 75 (H) 03/17/2016 1513   AST 29 05/26/2013 1357   ALT 45 03/17/2016 1513   ALT 29 05/26/2013 1357   ALKPHOS 130 (H) 03/17/2016 1513   ALKPHOS 73 05/26/2013 1357   BILITOT 1.1 03/17/2016 1513   BILITOT 1.69 (H) 05/26/2013 1357   GFRNONAA 38 (L) 04/30/2016 2125   GFRAA 44 (L) 04/30/2016 2125    No results found for: SPEP, UPEP  Lab Results  Component Value Date   WBC 4.9 07/17/2016   NEUTROABS 2.8 07/17/2016   HGB 10.4 (L) 07/17/2016  HCT 32.4 (L) 07/17/2016   MCV 86.9 07/17/2016   PLT 156 07/17/2016      Chemistry      Component Value Date/Time   NA 140 04/30/2016 2125   NA 145 05/26/2013 1357   K 3.6 04/30/2016 2125   K 4.1 05/26/2013 1357   CL 106 04/30/2016 2125   CO2 22 04/30/2016 2125   CO2 22 05/26/2013 1357   BUN 15 04/30/2016 2125   BUN 19.8 05/26/2013 1357   CREATININE 1.63 (H) 04/30/2016 2125   CREATININE 1.60 (H) 03/31/2016 1024   CREATININE 1.4 (H) 05/26/2013 1357      Component Value Date/Time   CALCIUM 9.0 04/30/2016 2125   CALCIUM 9.7 05/26/2013 1357   ALKPHOS 130 (H) 03/17/2016 1513   ALKPHOS 73 05/26/2013 1357   AST 75 (H) 03/17/2016 1513   AST 29 05/26/2013 1357   ALT 45 03/17/2016 1513   ALT 29 05/26/2013 1357   BILITOT 1.1 03/17/2016 1513   BILITOT 1.69 (H) 05/26/2013 1357       ASSESSMENT & PLAN:  Iron deficiency anemia due to chronic blood loss The patient had recent iron deficiency anemia due to GI bleed. After IV iron, his symptoms of restless leg had resolved. He does not need further intravenous iron today. I recommend recheck iron studies again in  May.  Anemia due to stage 3 chronic kidney disease He has persistent anemia despite adequate iron replacement therapy, likely secondary to component of anemia chronic renal failure. He will benefit from darbepoetin injection in the future, however with his hemoglobin greater than 10, he does not qualify for injection right now. We discussed some of the risks, benefits, and alternatives of erythropoietin stimulating agents such as Procrit or Aranesp. The patient is symptomatic from anemia and the EPO level is low. Some of the side-effects to be expected including risks of allergic reactions, skin rashes, headaches, risk of blood clots including heart attack and stroke. There is rare risks of causing growth of cancers.The patient is willing to proceed in the future. I gave him patient education handout and will get insurance authorization to proceed with darbepoetin injection in the future if he has adequate iron replacement, along with presence of persistent anemia chronic illness.     Orders Placed This Encounter  Procedures  . Ferritin    Standing Status:   Future    Standing Expiration Date:   08/21/2017  . CBC & Diff and Retic    Standing Status:   Future    Standing Expiration Date:   08/21/2017  . Erythropoietin    Standing Status:   Future    Standing Expiration Date:   08/21/2017  . Iron and TIBC    Standing Status:   Future    Standing Expiration Date:   08/21/2017  . Sedimentation rate    Standing Status:   Future    Standing Expiration Date:   08/21/2017    All questions were answered. The patient knows to call the clinic with any problems, questions or concerns. No barriers to learning was detected.  I spent 15 minutes counseling the patient face to face. The total time spent in the appointment was 20 minutes and more than 50% was on counseling.     Heath Lark, MD 3/26/201811:59 AM

## 2016-07-17 NOTE — Assessment & Plan Note (Signed)
The patient had recent iron deficiency anemia due to GI bleed. After IV iron, his symptoms of restless leg had resolved. He does not need further intravenous iron today. I recommend recheck iron studies again in May.

## 2016-07-17 NOTE — Assessment & Plan Note (Signed)
He has persistent anemia despite adequate iron replacement therapy, likely secondary to component of anemia chronic renal failure. He will benefit from darbepoetin injection in the future, however with his hemoglobin greater than 10, he does not qualify for injection right now. We discussed some of the risks, benefits, and alternatives of erythropoietin stimulating agents such as Procrit or Aranesp. The patient is symptomatic from anemia and the EPO level is low. Some of the side-effects to be expected including risks of allergic reactions, skin rashes, headaches, risk of blood clots including heart attack and stroke. There is rare risks of causing growth of cancers.The patient is willing to proceed in the future. I gave him patient education handout and will get insurance authorization to proceed with darbepoetin injection in the future if he has adequate iron replacement, along with presence of persistent anemia chronic illness.

## 2016-07-17 NOTE — Telephone Encounter (Signed)
Gave patient avs report and appointments for May  °

## 2016-07-17 NOTE — Patient Instructions (Signed)

## 2016-07-18 ENCOUNTER — Encounter: Payer: Self-pay | Admitting: Cardiovascular Disease

## 2016-07-18 ENCOUNTER — Ambulatory Visit (INDEPENDENT_AMBULATORY_CARE_PROVIDER_SITE_OTHER): Payer: Medicare Other | Admitting: Cardiovascular Disease

## 2016-07-18 DIAGNOSIS — I2510 Atherosclerotic heart disease of native coronary artery without angina pectoris: Secondary | ICD-10-CM | POA: Diagnosis not present

## 2016-07-18 DIAGNOSIS — E785 Hyperlipidemia, unspecified: Secondary | ICD-10-CM | POA: Diagnosis not present

## 2016-07-18 DIAGNOSIS — Z9861 Coronary angioplasty status: Secondary | ICD-10-CM | POA: Diagnosis not present

## 2016-07-18 DIAGNOSIS — I10 Essential (primary) hypertension: Secondary | ICD-10-CM | POA: Diagnosis not present

## 2016-07-18 DIAGNOSIS — I350 Nonrheumatic aortic (valve) stenosis: Secondary | ICD-10-CM

## 2016-07-18 NOTE — Assessment & Plan Note (Signed)
History of CAD status post PCI by myself/23/97. He had moderate LAD and circumflex disease as well as ramus disease at that time. I did recently performed cardiac catheterization on him 03/02/16 revealing diffuse in-stent restenosis within the proximal RCA stent as well as distal RCA disease. His LAD stent was widely patent. I restented his entire RCA. His symptoms improved. He remains on aspirin and Brilenta.

## 2016-07-18 NOTE — Assessment & Plan Note (Signed)
History of hyperlipidemia on statin therapy with recent lipid profile performed 03/02/16 revealed a total cholesterol of 49, LDL 93 and HDL of 27.

## 2016-07-18 NOTE — Assessment & Plan Note (Signed)
History of hypertension with blood pressure measured 128/72. He is on hydrochlorothiazide, Avapro. Continue current meds at current dosing.

## 2016-07-18 NOTE — Progress Notes (Signed)
07/18/2016 Kevin Rhodes   07-13-1933  [id number]  Primary Physician [name], PA-C Primary Cardiologist: Lorretta Harp MD [name]  HPI:  The patient is a delightful 83 year old, thin-appearing, widowed (wife passed away 3 years ago) Caucasian male, father of 78, grandfather to 7 grandchildren who I saw 04/07/16... He has a history of CAD status post inferior wall myocardial infarction, PCI and stenting by myself on October 15, 1995. He had moderate LAD and circumflex disease as well as ramus disease at that time. Functional studies performed November 2011 showed inferior scar without ischemia, unchanged from prior studies. His other problems include hypertension, hyperlipidemia and remote tobacco abuse, having quit back in 1997. He was complaining of exertional dyspnea with pain going to his jaws and arms prompting heart cath reveal LAD and RCA disease which I stented in a staged fashion using 9 drug-eluting stents. His symptoms resolved. He became anemic and had a GI bleed and underwent colonoscopy by Dr. Henrene Pastor revealing diverticulosis, esophageal strictures and ulcers, as well as an antral ulcer. PPIs were doubled. I did begin him on some Lexapro when I saw him last for situational depression which he did not tolerate, though he apparently is getting better over time. Since I saw him approximately 6 months ago he was admitted on 03/01/16 with unstable angina. His troponins bumped up to 1. He underwent cardiac catheterization by myself on 03/02/16 revealing a patent LAD stent, 70% "in-stent restenosis" within the proximal RCA stent with a tandem 80% lesion. Both of these RCA lesions were stented with surgery drug-eluting stents. He was enrolled in the twilight study. He was discharged home and readmitted on 03/17/16 with bradycardia and anemia. His bradycardia improved with discontinuation of beta blocker. He was transfused 2 units of packed red blood cells. Since being at home he  said several episodes of chest pain radiating to his jaw lasting up to 5 minutes at a time. He has been getting infusions of ferritin with improvement in his hemoglobin which is in the mid not 10 range.    Current Outpatient Prescriptions  Medication Sig Dispense Refill  . aspirin EC 81 MG tablet Take 1 tablet (81 mg total) by mouth daily. 90 tablet 3  . dicyclomine (BENTYL) 10 MG capsule TAKE ONE CAPSULE BY MOUTH EVERY 6 HOURS AS NEEDED FOR ABDOMINAL PAIN/SPASMS    . glimepiride (AMARYL) 2 MG tablet Take 2 mg by mouth daily.     Marland Kitchen guaiFENesin-codeine 100-10 MG/5ML syrup Take 5 mLs by mouth 3 (three) times daily as needed for cough. 180 mL 0  . hydrochlorothiazide (MICROZIDE) 12.5 MG capsule TAKE ONE CAPSULE BY MOUTH EVERY DAY (Patient taking differently: Take 12.5 mg by mouth once a day) 90 capsule 2  . HYDROcodone-homatropine (HYCODAN) 5-1.5 MG/5ML syrup Take 5 mLs by mouth as needed.  0  . irbesartan (AVAPRO) 300 MG tablet TAKE 1 TABLET (300 MG TOTAL) BY MOUTH AT BEDTIME. 30 tablet 8  . isosorbide mononitrate (IMDUR) 30 MG 24 hr tablet Take 1 tablet (30 mg total) by mouth daily. 15 tablet 10  . metFORMIN (GLUCOPHAGE) 500 MG tablet Take 1,000 mg by mouth 2 (two) times daily with a meal.     . nitroGLYCERIN (NITROSTAT) 0.4 MG SL tablet Place 1 tablet (0.4 mg total) under the tongue every 5 (five) minutes x 3 doses as needed for chest pain. 25 tablet 3  . omega-3 acid ethyl esters (LOVAZA) 1 G capsule Take 1 g by  mouth 2 (two) times daily.     . pantoprazole (PROTONIX) 40 MG tablet Take 1 tablet (40 mg total) by mouth 2 (two) times daily. Take 40mg .   twice daily.  30 minutes before breakfast and 30 minutes before evening meal. (Patient taking differently: Take 40 mg by mouth 2 (two) times daily. ) 60 tablet 11  . polysaccharide iron (NIFEREX) 150 MG CAPS capsule Take 150 mg by mouth 2 (two) times daily.      . pravastatin (PRAVACHOL) 40 MG tablet Take 0.5 tablets (20 mg total) by mouth at  bedtime. 30 tablet 6  . ticagrelor (BRILINTA) 90 MG TABS tablet Take 1 tablet (90 mg total) by mouth 2 (two) times daily. 180 tablet 3  . timolol (TIMOPTIC) 0.5 % ophthalmic solution Place 1 drop into both eyes daily.     . traMADol (ULTRAM) 50 MG tablet Take 1 tablet (50 mg total) by mouth every 8 (eight) hours as needed (for pain). 30 tablet 0   No current facility-administered medications for this visit.     Allergies  Allergen Reactions  . Nsaids Other (See Comments)    Avoid due to h/o peptic ulcers  . Ambien [Zolpidem] Other (See Comments)    Mental changes    Social History   Social History  . Marital status: Widowed    Spouse name: N/A  . Number of children: 3  . Years of education: N/A   Occupational History  . retired    Social History Main Topics  . Smoking status: Former Smoker    Packs/day: 2.00    Years: 45.00    Types: Cigarettes    Quit date: 1997  . Smokeless tobacco: Never Used     Comment: "quit smoking cigarettes ~ 1997"  . Alcohol use No  . Drug use: No  . Sexual activity: No   Other Topics Concern  . Not on file   Social History Narrative  . No narrative on file     Review of Systems: General: negative for chills, fever, night sweats or weight changes.  Cardiovascular: negative for chest pain, dyspnea on exertion, edema, orthopnea, palpitations, paroxysmal nocturnal dyspnea or shortness of breath Dermatological: negative for rash Respiratory: negative for cough or wheezing Urologic: negative for hematuria Abdominal: negative for nausea, vomiting, diarrhea, bright red blood per rectum, melena, or hematemesis Neurologic: negative for visual changes, syncope, or dizziness All other systems reviewed and are otherwise negative except as noted above.    Blood pressure 128/72, pulse 89, height 5\' 8"  (1.727 m), SpO2 100 %.  General appearance: alert and no distress Neck: no adenopathy, no carotid bruit, no JVD, supple, symmetrical, trachea  midline and thyroid not enlarged, symmetric, no tenderness/mass/nodules Lungs: clear to auscultation bilaterally Heart: regular rate and rhythm, S1, S2 normal, no murmur, click, rub or gallop Extremities: extremities normal, atraumatic, no cyanosis or edema  EKG not performed today  ASSESSMENT AND PLAN:   CAD S/P multiple PCI History of CAD status post PCI by myself/23/97. He had moderate LAD and circumflex disease as well as ramus disease at that time. I did recently performed cardiac catheterization on him 03/02/16 revealing diffuse in-stent restenosis within the proximal RCA stent as well as distal RCA disease. His LAD stent was widely patent. I restented his entire RCA. His symptoms improved. He remains on aspirin and Brilenta.  Essential hypertension History of hypertension with blood pressure measured 128/72. He is on hydrochlorothiazide, Avapro. Continue current meds at current dosing.  Dyslipidemia History  of hyperlipidemia on statin therapy with recent lipid profile performed 03/02/16 revealed a total cholesterol of 49, LDL 93 and HDL of 27.  Mild aortic stenosis History of mild aortic stenosis with echo performed 10/15/15 revealing normal LV systolic function, and aortic valve area 1.55 cm with peak gradient of 20 mmHg. We will recheck a 2-D echo in June of this year.      Lorretta Harp MD FACP,FACC,FAHA, [facility] 07/18/2016 12:02 PM

## 2016-07-18 NOTE — Assessment & Plan Note (Signed)
History of mild aortic stenosis with echo performed 10/15/15 revealing normal LV systolic function, and aortic valve area 1.55 cm with peak gradient of 20 mmHg. We will recheck a 2-D echo in June of this year.

## 2016-07-18 NOTE — Patient Instructions (Signed)

## 2016-08-28 ENCOUNTER — Telehealth: Payer: Self-pay

## 2016-08-28 NOTE — Telephone Encounter (Signed)
Can you call her back and ask when can she bring him in? Any day would be fine. Let me know and I will change the orders. Also pls send scheduling msg for labs appt

## 2016-08-28 NOTE — Telephone Encounter (Signed)
Called daughter, she wants lab appt this Thursday, May 10th at 1030. She wants to be called with results, just in case she needs to go another MD.  Scheduling message sent.

## 2016-08-28 NOTE — Telephone Encounter (Signed)
Patients daughter called. She wants to get his lab work moved up earlier, he has lab work on 09-12-16. She said her Dad is complaining of back and leg pain like he has before. Daughter wants to be called about scheduling a earlier appt, instead of her Dad.

## 2016-08-31 ENCOUNTER — Other Ambulatory Visit: Payer: Self-pay | Admitting: Hematology and Oncology

## 2016-08-31 ENCOUNTER — Other Ambulatory Visit (HOSPITAL_BASED_OUTPATIENT_CLINIC_OR_DEPARTMENT_OTHER): Payer: Medicare Other

## 2016-08-31 DIAGNOSIS — D539 Nutritional anemia, unspecified: Secondary | ICD-10-CM

## 2016-08-31 DIAGNOSIS — D500 Iron deficiency anemia secondary to blood loss (chronic): Secondary | ICD-10-CM

## 2016-08-31 DIAGNOSIS — D631 Anemia in chronic kidney disease: Secondary | ICD-10-CM

## 2016-08-31 DIAGNOSIS — N183 Chronic kidney disease, stage 3 (moderate): Secondary | ICD-10-CM

## 2016-08-31 LAB — CBC & DIFF AND RETIC
BASO%: 0.2 % (ref 0.0–2.0)
Basophils Absolute: 0 10*3/uL (ref 0.0–0.1)
EOS%: 9.1 % — ABNORMAL HIGH (ref 0.0–7.0)
Eosinophils Absolute: 0.4 10*3/uL (ref 0.0–0.5)
HCT: 30.5 % — ABNORMAL LOW (ref 38.4–49.9)
HGB: 9.7 g/dL — ABNORMAL LOW (ref 13.0–17.1)
IMMATURE RETIC FRACT: 12.9 % — AB (ref 3.00–10.60)
LYMPH%: 18.8 % (ref 14.0–49.0)
MCH: 28 pg (ref 27.2–33.4)
MCHC: 31.8 g/dL — AB (ref 32.0–36.0)
MCV: 88.2 fL (ref 79.3–98.0)
MONO#: 0.4 10*3/uL (ref 0.1–0.9)
MONO%: 8 % (ref 0.0–14.0)
NEUT%: 63.9 % (ref 39.0–75.0)
NEUTROS ABS: 2.9 10*3/uL (ref 1.5–6.5)
PLATELETS: 166 10*3/uL (ref 140–400)
RBC: 3.46 10*6/uL — AB (ref 4.20–5.82)
RDW: 15.3 % — ABNORMAL HIGH (ref 11.0–14.6)
Retic %: 1.1 % (ref 0.80–1.80)
Retic Ct Abs: 38.06 10*3/uL (ref 34.80–93.90)
WBC: 4.5 10*3/uL (ref 4.0–10.3)
lymph#: 0.9 10*3/uL (ref 0.9–3.3)

## 2016-08-31 LAB — IRON AND TIBC
%SAT: 16 % — AB (ref 20–55)
IRON: 65 ug/dL (ref 42–163)
TIBC: 403 ug/dL (ref 202–409)
UIBC: 337 ug/dL (ref 117–376)

## 2016-08-31 LAB — FERRITIN: Ferritin: 27 ng/ml (ref 22–316)

## 2016-09-01 LAB — SEDIMENTATION RATE: SED RATE: 28 mm/h (ref 0–30)

## 2016-09-01 LAB — ERYTHROPOIETIN: ERYTHROPOIETIN: 57.1 m[IU]/mL — AB (ref 2.6–18.5)

## 2016-09-06 ENCOUNTER — Telehealth: Payer: Self-pay | Admitting: *Deleted

## 2016-09-06 NOTE — Telephone Encounter (Signed)
Notified of message below

## 2016-09-06 NOTE — Telephone Encounter (Signed)
Daughter called requesting lab results. Any changes?

## 2016-09-06 NOTE — Telephone Encounter (Signed)
Labs are abnormal He would benefit from IV iron as scheduled

## 2016-09-12 ENCOUNTER — Other Ambulatory Visit: Payer: Medicare Other

## 2016-09-14 ENCOUNTER — Other Ambulatory Visit: Payer: Self-pay | Admitting: Hematology and Oncology

## 2016-09-14 DIAGNOSIS — N183 Chronic kidney disease, stage 3 (moderate): Principal | ICD-10-CM

## 2016-09-14 DIAGNOSIS — D631 Anemia in chronic kidney disease: Secondary | ICD-10-CM

## 2016-09-15 ENCOUNTER — Telehealth: Payer: Self-pay | Admitting: Hematology and Oncology

## 2016-09-15 NOTE — Telephone Encounter (Signed)
Added lab 5/29 per 5/24 sch msg. Spoke with dtr.

## 2016-09-19 ENCOUNTER — Ambulatory Visit (HOSPITAL_BASED_OUTPATIENT_CLINIC_OR_DEPARTMENT_OTHER): Payer: Medicare Other | Admitting: Hematology and Oncology

## 2016-09-19 ENCOUNTER — Ambulatory Visit (HOSPITAL_BASED_OUTPATIENT_CLINIC_OR_DEPARTMENT_OTHER): Payer: Medicare Other

## 2016-09-19 ENCOUNTER — Other Ambulatory Visit (HOSPITAL_BASED_OUTPATIENT_CLINIC_OR_DEPARTMENT_OTHER): Payer: Medicare Other

## 2016-09-19 ENCOUNTER — Ambulatory Visit: Payer: Medicare Other

## 2016-09-19 ENCOUNTER — Encounter: Payer: Self-pay | Admitting: Hematology and Oncology

## 2016-09-19 ENCOUNTER — Other Ambulatory Visit: Payer: Self-pay | Admitting: Cardiovascular Disease

## 2016-09-19 ENCOUNTER — Telehealth: Payer: Self-pay | Admitting: Hematology and Oncology

## 2016-09-19 VITALS — BP 119/78 | HR 62 | Temp 97.8°F | Resp 18 | Ht 68.0 in | Wt 183.9 lb

## 2016-09-19 VITALS — BP 170/81 | HR 67 | Temp 98.2°F | Resp 18

## 2016-09-19 DIAGNOSIS — N183 Chronic kidney disease, stage 3 unspecified: Secondary | ICD-10-CM

## 2016-09-19 DIAGNOSIS — D631 Anemia in chronic kidney disease: Secondary | ICD-10-CM

## 2016-09-19 DIAGNOSIS — D539 Nutritional anemia, unspecified: Secondary | ICD-10-CM

## 2016-09-19 DIAGNOSIS — D500 Iron deficiency anemia secondary to blood loss (chronic): Secondary | ICD-10-CM | POA: Diagnosis present

## 2016-09-19 LAB — CBC WITH DIFFERENTIAL/PLATELET
BASO%: 0.3 % (ref 0.0–2.0)
Basophils Absolute: 0 10*3/uL (ref 0.0–0.1)
EOS ABS: 0.4 10*3/uL (ref 0.0–0.5)
EOS%: 7 % (ref 0.0–7.0)
HCT: 31.2 % — ABNORMAL LOW (ref 38.4–49.9)
HEMOGLOBIN: 10.3 g/dL — AB (ref 13.0–17.1)
LYMPH%: 20.9 % (ref 14.0–49.0)
MCH: 28.3 pg (ref 27.2–33.4)
MCHC: 33.1 g/dL (ref 32.0–36.0)
MCV: 85.5 fL (ref 79.3–98.0)
MONO#: 0.4 10*3/uL (ref 0.1–0.9)
MONO%: 6.9 % (ref 0.0–14.0)
NEUT%: 64.9 % (ref 39.0–75.0)
NEUTROS ABS: 3.9 10*3/uL (ref 1.5–6.5)
PLATELETS: 210 10*3/uL (ref 140–400)
RBC: 3.65 10*6/uL — ABNORMAL LOW (ref 4.20–5.82)
RDW: 15.8 % — AB (ref 11.0–14.6)
WBC: 6.1 10*3/uL (ref 4.0–10.3)
lymph#: 1.3 10*3/uL (ref 0.9–3.3)

## 2016-09-19 MED ORDER — SODIUM CHLORIDE 0.9 % IV SOLN
Freq: Once | INTRAVENOUS | Status: AC
  Administered 2016-09-19: 15:00:00 via INTRAVENOUS

## 2016-09-19 MED ORDER — FERUMOXYTOL INJECTION 510 MG/17 ML
510.0000 mg | Freq: Once | INTRAVENOUS | Status: AC
  Administered 2016-09-19: 510 mg via INTRAVENOUS
  Filled 2016-09-19: qty 17

## 2016-09-19 NOTE — Assessment & Plan Note (Signed)
The patient had recent iron deficiency anemia due to GI bleed. After IV iron, his symptoms of restless leg had resolved. Recently, he had recurrence of symptoms The most likely cause of his anemia is due to chronic blood loss/malabsorption syndrome. We discussed some of the risks, benefits, and alternatives of intravenous iron infusions. The patient is symptomatic from anemia and the iron level is critically low. He tolerated oral iron supplement poorly and desires to achieved higher levels of iron faster for adequate hematopoesis. Some of the side-effects to be expected including risks of infusion reactions, phlebitis, headaches, nausea and fatigue.  The patient is willing to proceed. Patient education material was dispensed.  Goal is to keep ferritin level greater than 50 We will proceed with weekly IV iron x 2 and recheck labs in 3 months

## 2016-09-19 NOTE — Progress Notes (Signed)
[facility] OFFICE PROGRESS NOTE  [name], PA-C SUMMARY OF HEMATOLOGIC HISTORY:  Kevin Rhodes 83 y.o. male is here because of persistent, recurrent iron deficiency anemia. The patient was seen in the cancer center for to 5 years ago for similar reasons. He was lost to follow-up over 3 years ago. He had recurrent admissions to the hospital for cardiac issues. He had multiple stents placed for his heart. The patient is also noted to have chronic renal failure. He denies ingestion of NSAID. He had received blood transfusions in the past His recent blood count for the past few months have been under 10 g. He was instructed to take oral iron supplement but tolerated that very poorly with severe constipation. He complained of excessive fatigue and leg cramps but denies chest pain, dizziness or shortness of breath recently.  He had not noticed any recent bleeding such as epistaxis, hematuria or hematochezia His last colonoscopy was in October 2012 which showed diverticulosis and internal hemorrhoids. Upper endoscopy evaluation on 12/21/2015 shows significant esophagitis  He denies any pica and eats a variety of diet.The patient was prescribed oral iron supplements and he takes twice a day but has poor tolerance due to constipation The patient received intravenous iron in February 2018 INTERVAL HISTORY: Kevin Rhodes 83 y.o. male returns for further follow-up He has recent recurrent symptoms of restless legs The patient denies any recent signs or symptoms of bleeding such as spontaneous epistaxis, hematuria or hematochezia. No pica He denies symptoms of anemia such as chest pain, dizziness or shortness of breath.   I have reviewed the past medical history, past surgical history, social history and family history with the patient and they are unchanged from previous note.  ALLERGIES:  is allergic to nsaids and ambien [zolpidem].  MEDICATIONS:  Current Outpatient  Prescriptions  Medication Sig Dispense Refill  . aspirin EC 81 MG tablet Take 1 tablet (81 mg total) by mouth daily. 90 tablet 3  . dicyclomine (BENTYL) 10 MG capsule TAKE ONE CAPSULE BY MOUTH EVERY 6 HOURS AS NEEDED FOR ABDOMINAL PAIN/SPASMS    . glimepiride (AMARYL) 2 MG tablet Take 2 mg by mouth daily.     Marland Kitchen guaiFENesin-codeine 100-10 MG/5ML syrup Take 5 mLs by mouth 3 (three) times daily as needed for cough. 180 mL 0  . hydrochlorothiazide (MICROZIDE) 12.5 MG capsule TAKE ONE CAPSULE BY MOUTH EVERY DAY (Patient taking differently: Take 12.5 mg by mouth once a day) 90 capsule 2  . HYDROcodone-homatropine (HYCODAN) 5-1.5 MG/5ML syrup Take 5 mLs by mouth as needed.  0  . irbesartan (AVAPRO) 300 MG tablet TAKE 1 TABLET (300 MG TOTAL) BY MOUTH AT BEDTIME. 30 tablet 8  . isosorbide mononitrate (IMDUR) 30 MG 24 hr tablet Take 1 tablet (30 mg total) by mouth daily. 15 tablet 10  . metFORMIN (GLUCOPHAGE) 500 MG tablet Take 1,000 mg by mouth 2 (two) times daily with a meal.     . nitroGLYCERIN (NITROSTAT) 0.4 MG SL tablet Place 1 tablet (0.4 mg total) under the tongue every 5 (five) minutes x 3 doses as needed for chest pain. 25 tablet 3  . omega-3 acid ethyl esters (LOVAZA) 1 G capsule Take 1 g by mouth 2 (two) times daily.     . pantoprazole (PROTONIX) 40 MG tablet Take 1 tablet (40 mg total) by mouth 2 (two) times daily. Take 40mg .   twice daily.  30 minutes before breakfast and 30 minutes before evening meal. (Patient taking differently:  Take 40 mg by mouth 2 (two) times daily. ) 60 tablet 11  . polysaccharide iron (NIFEREX) 150 MG CAPS capsule Take 150 mg by mouth 2 (two) times daily.      . pravastatin (PRAVACHOL) 40 MG tablet Take 0.5 tablets (20 mg total) by mouth at bedtime. 30 tablet 6  . ticagrelor (BRILINTA) 90 MG TABS tablet Take 1 tablet (90 mg total) by mouth 2 (two) times daily. 180 tablet 3  . timolol (TIMOPTIC) 0.5 % ophthalmic solution Place 1 drop into both eyes daily.     . traMADol  (ULTRAM) 50 MG tablet Take 1 tablet (50 mg total) by mouth every 8 (eight) hours as needed (for pain). 30 tablet 0   No current facility-administered medications for this visit.      REVIEW OF SYSTEMS:   Constitutional: Denies fevers, chills or night sweats Eyes: Denies blurriness of vision Ears, nose, mouth, throat, and face: Denies mucositis or sore throat Respiratory: Denies cough, dyspnea or wheezes Cardiovascular: Denies palpitation, chest discomfort or lower extremity swelling Gastrointestinal:  Denies nausea, heartburn or change in bowel habits Skin: Denies abnormal skin rashes Lymphatics: Denies new lymphadenopathy or easy bruising Neurological:Denies numbness, tingling or new weaknesses Behavioral/Psych: Mood is stable, no new changes  All other systems were reviewed with the patient and are negative.  PHYSICAL EXAMINATION: ECOG PERFORMANCE STATUS: 1 - Symptomatic but completely ambulatory  Vitals:   09/19/16 1247  BP: 119/78  Pulse: 62  Resp: 18  Temp: 97.8 F (36.6 C)   Filed Weights   09/19/16 1247  Weight: 183 lb 14.4 oz (83.4 kg)    GENERAL:alert, no distress and comfortable SKIN: skin color, texture, turgor are normal, no rashes or significant lesions EYES: normal, Conjunctiva are pink and non-injected, sclera clear OROPHARYNX:no exudate, no erythema and lips, buccal mucosa, and tongue normal  NECK: supple, thyroid normal size, non-tender, without nodularity LYMPH:  no palpable lymphadenopathy in the cervical, axillary or inguinal LUNGS: clear to auscultation and percussion with normal breathing effort HEART: regular rate & rhythm and no murmurs and no lower extremity edema ABDOMEN:abdomen soft, non-tender and normal bowel sounds Musculoskeletal:no cyanosis of digits and no clubbing  NEURO: alert & oriented x 3 with fluent speech, no focal motor/sensory deficits  LABORATORY DATA:  I have reviewed the data as listed     Component Value Date/Time   NA  140 04/30/2016 2125   NA 145 05/26/2013 1357   K 3.6 04/30/2016 2125   K 4.1 05/26/2013 1357   CL 106 04/30/2016 2125   CO2 22 04/30/2016 2125   CO2 22 05/26/2013 1357   GLUCOSE 320 (H) 04/30/2016 2125   GLUCOSE 128 05/26/2013 1357   BUN 15 04/30/2016 2125   BUN 19.8 05/26/2013 1357   CREATININE 1.63 (H) 04/30/2016 2125   CREATININE 1.60 (H) 03/31/2016 1024   CREATININE 1.4 (H) 05/26/2013 1357   CALCIUM 9.0 04/30/2016 2125   CALCIUM 9.7 05/26/2013 1357   PROT 6.5 03/17/2016 1513   PROT 6.9 05/26/2013 1357   ALBUMIN 3.4 (L) 03/17/2016 1513   ALBUMIN 3.6 05/26/2013 1357   AST 75 (H) 03/17/2016 1513   AST 29 05/26/2013 1357   ALT 45 03/17/2016 1513   ALT 29 05/26/2013 1357   ALKPHOS 130 (H) 03/17/2016 1513   ALKPHOS 73 05/26/2013 1357   BILITOT 1.1 03/17/2016 1513   BILITOT 1.69 (H) 05/26/2013 1357   GFRNONAA 38 (L) 04/30/2016 2125   GFRAA 44 (L) 04/30/2016 2125    No  results found for: SPEP, UPEP  Lab Results  Component Value Date   WBC 6.1 09/19/2016   NEUTROABS 3.9 09/19/2016   HGB 10.3 (L) 09/19/2016   HCT 31.2 (L) 09/19/2016   MCV 85.5 09/19/2016   PLT 210 09/19/2016      Chemistry      Component Value Date/Time   NA 140 04/30/2016 2125   NA 145 05/26/2013 1357   K 3.6 04/30/2016 2125   K 4.1 05/26/2013 1357   CL 106 04/30/2016 2125   CO2 22 04/30/2016 2125   CO2 22 05/26/2013 1357   BUN 15 04/30/2016 2125   BUN 19.8 05/26/2013 1357   CREATININE 1.63 (H) 04/30/2016 2125   CREATININE 1.60 (H) 03/31/2016 1024   CREATININE 1.4 (H) 05/26/2013 1357      Component Value Date/Time   CALCIUM 9.0 04/30/2016 2125   CALCIUM 9.7 05/26/2013 1357   ALKPHOS 130 (H) 03/17/2016 1513   ALKPHOS 73 05/26/2013 1357   AST 75 (H) 03/17/2016 1513   AST 29 05/26/2013 1357   ALT 45 03/17/2016 1513   ALT 29 05/26/2013 1357   BILITOT 1.1 03/17/2016 1513   BILITOT 1.69 (H) 05/26/2013 1357      ASSESSMENT & PLAN:  Iron deficiency anemia due to chronic blood loss The  patient had recent iron deficiency anemia due to GI bleed. After IV iron, his symptoms of restless leg had resolved. Recently, he had recurrence of symptoms The most likely cause of his anemia is due to chronic blood loss/malabsorption syndrome. We discussed some of the risks, benefits, and alternatives of intravenous iron infusions. The patient is symptomatic from anemia and the iron level is critically low. He tolerated oral iron supplement poorly and desires to achieved higher levels of iron faster for adequate hematopoesis. Some of the side-effects to be expected including risks of infusion reactions, phlebitis, headaches, nausea and fatigue.  The patient is willing to proceed. Patient education material was dispensed.  Goal is to keep ferritin level greater than 50 We will proceed with weekly IV iron x 2 and recheck labs in 3 months   Chronic kidney disease, stage III (moderate) This is multifactorial, likely related to his age and cardiac issues Continue medical management. With each IV iron infusion, his hemoglobin improves greater than 10 For that reason, I do not feel strongly he would benefit from darbepoetin injection for now   Orders Placed This Encounter  Procedures  . Ferritin    Standing Status:   Future    Standing Expiration Date:   10/24/2017  . Iron and TIBC    Standing Status:   Future    Standing Expiration Date:   10/24/2017    All questions were answered. The patient knows to call the clinic with any problems, questions or concerns. No barriers to learning was detected.  I spent 15 minutes counseling the patient face to face. The total time spent in the appointment was 20 minutes and more than 50% was on counseling.     Heath Lark, MD 5/29/20185:52 PM

## 2016-09-19 NOTE — Assessment & Plan Note (Signed)
This is multifactorial, likely related to his age and cardiac issues Continue medical management. With each IV iron infusion, his hemoglobin improves greater than 10 For that reason, I do not feel strongly he would benefit from darbepoetin injection for now

## 2016-09-19 NOTE — Telephone Encounter (Signed)
Gave patient AVS and calender per 5/29 LOS  

## 2016-09-20 NOTE — Telephone Encounter (Signed)
Rx has been sent to the pharmacy electronically. ° °

## 2016-09-26 ENCOUNTER — Ambulatory Visit (HOSPITAL_BASED_OUTPATIENT_CLINIC_OR_DEPARTMENT_OTHER): Payer: Medicare Other

## 2016-09-26 VITALS — BP 161/72 | HR 60 | Temp 98.1°F | Resp 18

## 2016-09-26 DIAGNOSIS — N183 Chronic kidney disease, stage 3 unspecified: Secondary | ICD-10-CM

## 2016-09-26 DIAGNOSIS — D500 Iron deficiency anemia secondary to blood loss (chronic): Secondary | ICD-10-CM | POA: Diagnosis present

## 2016-09-26 DIAGNOSIS — D631 Anemia in chronic kidney disease: Secondary | ICD-10-CM

## 2016-09-26 MED ORDER — SODIUM CHLORIDE 0.9 % IV SOLN
510.0000 mg | Freq: Once | INTRAVENOUS | Status: AC
  Administered 2016-09-26: 510 mg via INTRAVENOUS
  Filled 2016-09-26: qty 17

## 2016-09-26 MED ORDER — SODIUM CHLORIDE 0.9 % IV SOLN
Freq: Once | INTRAVENOUS | Status: AC
  Administered 2016-09-26: 15:00:00 via INTRAVENOUS

## 2016-09-26 NOTE — Patient Instructions (Signed)
Ferumoxytol injection What is this medicine? FERUMOXYTOL is an iron complex. Iron is used to make healthy red blood cells, which carry oxygen and nutrients throughout the body. This medicine is used to treat iron deficiency anemia in people with chronic kidney disease. COMMON BRAND NAME(S): Feraheme What should I tell my health care provider before I take this medicine? They need to know if you have any of these conditions: -anemia not caused by low iron levels -high levels of iron in the blood -magnetic resonance imaging (MRI) test scheduled -an unusual or allergic reaction to iron, other medicines, foods, dyes, or preservatives -pregnant or trying to get pregnant -breast-feeding How should I use this medicine? This medicine is for injection into a vein. It is given by a health care professional in a hospital or clinic setting. Talk to your pediatrician regarding the use of this medicine in children. Special care may be needed. What if I miss a dose? It is important not to miss your dose. Call your doctor or health care professional if you are unable to keep an appointment. What may interact with this medicine? This medicine may interact with the following medications: -other iron products What should I watch for while using this medicine? Visit your doctor or healthcare professional regularly. Tell your doctor or healthcare professional if your symptoms do not start to get better or if they get worse. You may need blood work done while you are taking this medicine. You may need to follow a special diet. Talk to your doctor. Foods that contain iron include: whole grains/cereals, dried fruits, beans, or peas, leafy green vegetables, and organ meats (liver, kidney). What side effects may I notice from receiving this medicine? Side effects that you should report to your doctor or health care professional as soon as possible: -allergic reactions like skin rash, itching or hives, swelling of the  face, lips, or tongue -breathing problems -changes in blood pressure -feeling faint or lightheaded, falls -fever or chills -flushing, sweating, or hot feelings -swelling of the ankles or feet Side effects that usually do not require medical attention (report to your doctor or health care professional if they continue or are bothersome): -diarrhea -headache -nausea, vomiting -stomach pain Where should I keep my medicine? This drug is given in a hospital or clinic and will not be stored at home.  2017 Elsevier/Gold Standard (2015-05-13 12:41:49)  

## 2016-09-29 ENCOUNTER — Telehealth: Payer: Self-pay

## 2016-09-29 NOTE — Telephone Encounter (Signed)
The patient has major cardiac issue Dr. Gwenlyn Found is the cardiologist and I would recommend the daughter to call his office. His recent anemia is treated so I think it could be cardiac chest pain

## 2016-09-29 NOTE — Telephone Encounter (Signed)
Daughter called and left message to call her. Called back, she reported that patient had episode last night of heavy feeling in chest, that has gone away today. Today he is having a leg weakness, that is making it hard for him to walk. No other problems per daughter. He had these symptons after heart stent placement per daughter back in the winter. I asked if she called her Dads cardiologist and she said no, that did not find anything when he had symptoms before.

## 2016-09-29 NOTE — Telephone Encounter (Signed)
Called with below message, verbalized understanding. 

## 2016-10-12 ENCOUNTER — Telehealth: Payer: Self-pay | Admitting: Cardiovascular Disease

## 2016-10-12 NOTE — Telephone Encounter (Signed)
error 

## 2016-10-20 ENCOUNTER — Other Ambulatory Visit (HOSPITAL_COMMUNITY): Payer: Medicare Other

## 2016-10-22 DEATH — deceased

## 2016-10-28 ENCOUNTER — Other Ambulatory Visit: Payer: Self-pay | Admitting: Nurse Practitioner

## 2016-12-12 ENCOUNTER — Other Ambulatory Visit: Payer: Medicare Other

## 2016-12-19 ENCOUNTER — Ambulatory Visit: Payer: Medicare Other

## 2016-12-19 ENCOUNTER — Ambulatory Visit: Payer: Medicare Other | Admitting: Hematology and Oncology

## 2017-11-07 IMAGING — CT CT CERVICAL SPINE W/O CM
2 of 10 series · 8 of 33 positions shown, 9 images · non-contrast
Comparison: None.

CLINICAL DATA: Pain after trauma

EXAM:
CT HEAD WITHOUT CONTRAST
CT CERVICAL SPINE WITHOUT CONTRAST
TECHNIQUE: Multidetector CT imaging of the head and cervical spine was
performed following the standard protocol without intravenous
contrast. Multiplanar CT image reconstructions of the cervical spine
were also generated.

[Series 8: c_spine 2.0 i30s 3 · axial · 0.27mm/px · z∈[-274,-76]mm · 3 of 100 slices shown, 4 images]
[im 1/100  soft-tissue]
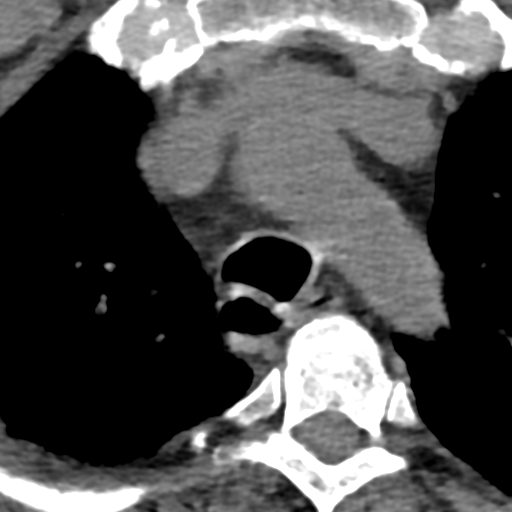
[im 1/100  bone]
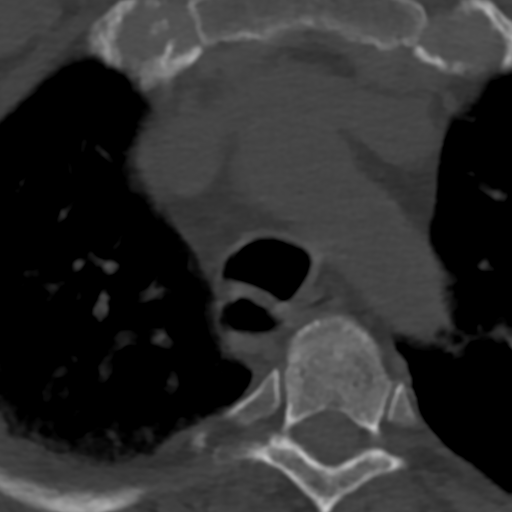
[im 50/100  bone]
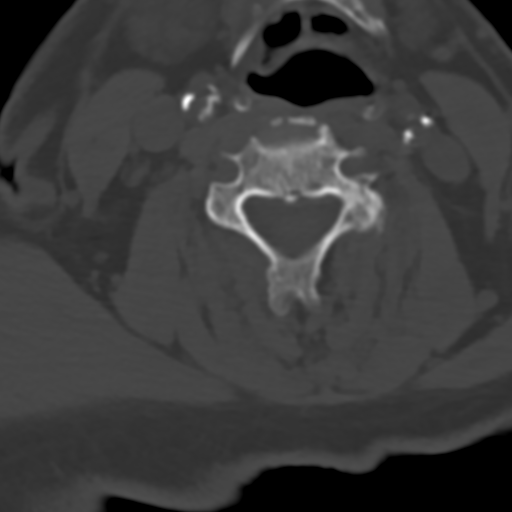
[im 100/100  bone]
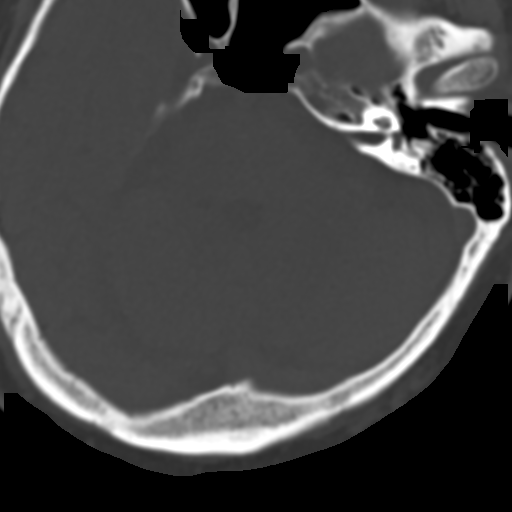

[Series 12: sagittals · sagittal · 0.29mm/px · 5 of 61 slices shown]
[im 11/61  bone]
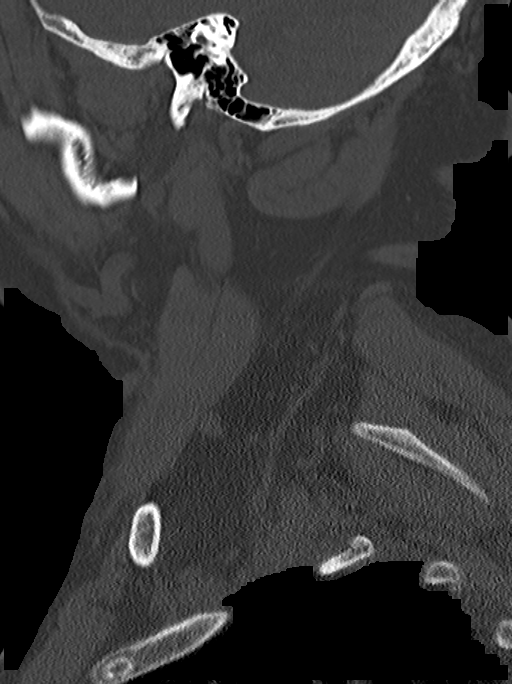
[im 21/61  bone]
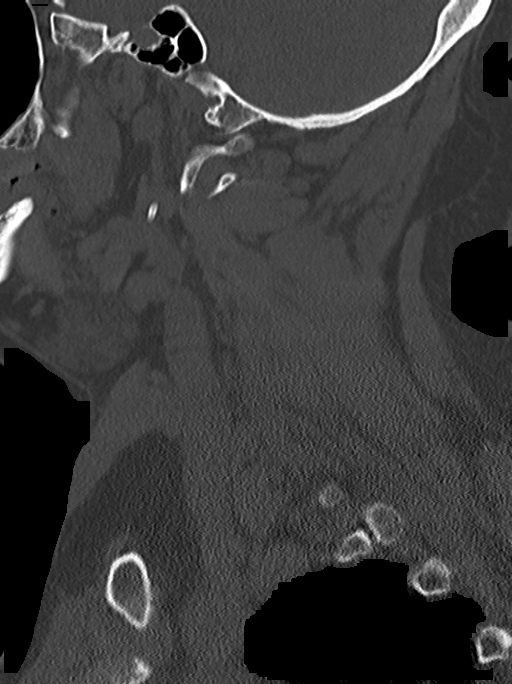
[im 31/61  bone]
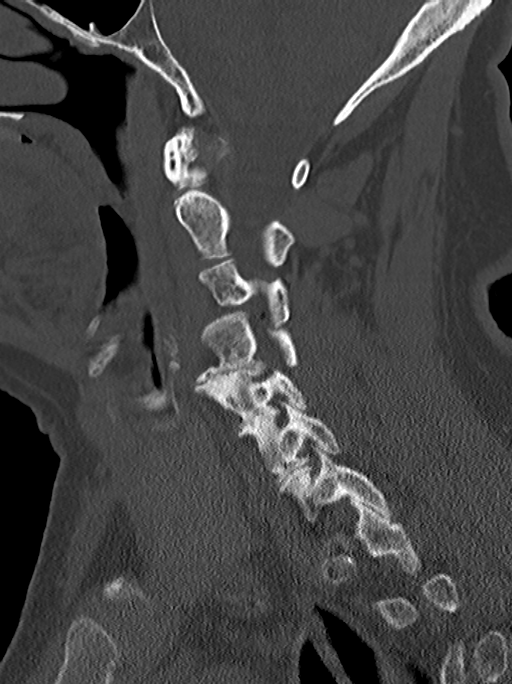
[im 41/61  bone]
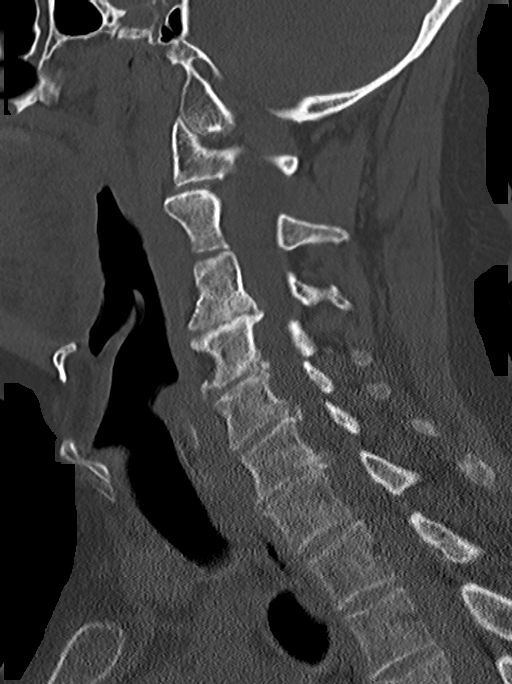
[im 51/61  bone]
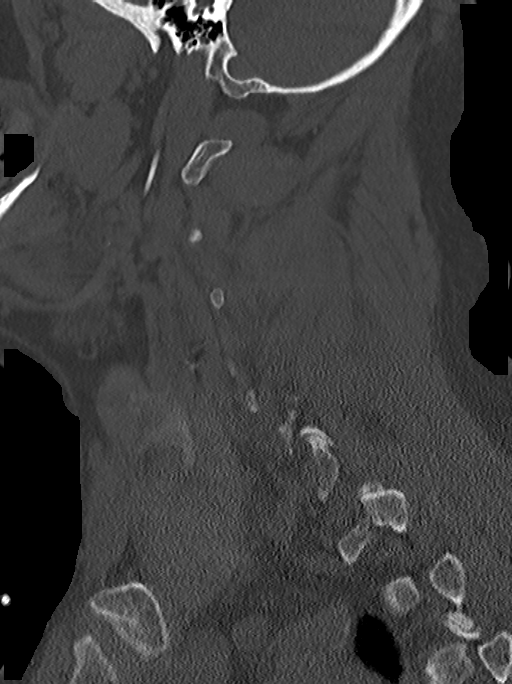

[8 of 33 positions shown; findings below may reference images not displayed]

FINDINGS: CT HEAD FINDINGS

No acute abnormalities associated with the paranasal sinuses. The
mastoid air cells and middle ears are well aerated. No acute
fractures. Soft tissue swelling is seen over the vertex, centered
just to the left consistent with the reported history. No underlying
fracture is identified. Remainder of the extracranial soft tissues
are normal. No subdural, epidural, or subarachnoid hemorrhage. No
mass, mass effect, or midline shift. Cerebellum, brainstem, and
basal cisterns are normal. Mild scattered white matter changes with
no acute cortical ischemia.

CT CERVICAL SPINE FINDINGS

There is 2 mm of retrolisthesis of the C3 versus C4. No other
malalignment. No fractures are seen. Multilevel degenerative changes
with small anterior and posterior osteophytes. Multilevel neural
foraminal narrowing bilaterally such as at C4-5 and C5-6. No
critical canal narrowing identified. Multilevel disc bulges seen as
well resulting in canal narrowing at some levels including at C6-7
where the canal measures 9 mm in AP dimension.
IMPRESSION: 1. Hematoma at the left vertex.
2. No acute intracranial abnormality.
3. Degenerative changes in the cervical spine with no fracture.
Minimal retrolisthesis of C3 versus C4 is likely degenerative.

## 2018-04-14 IMAGING — DX DG CHEST 2V
2 series · 2 of 2 positions shown · non-contrast
Comparison: 03/06/2011

CLINICAL DATA: Left arm and chest pain.

EXAM:
CHEST  2 VIEW

[w chest pa]
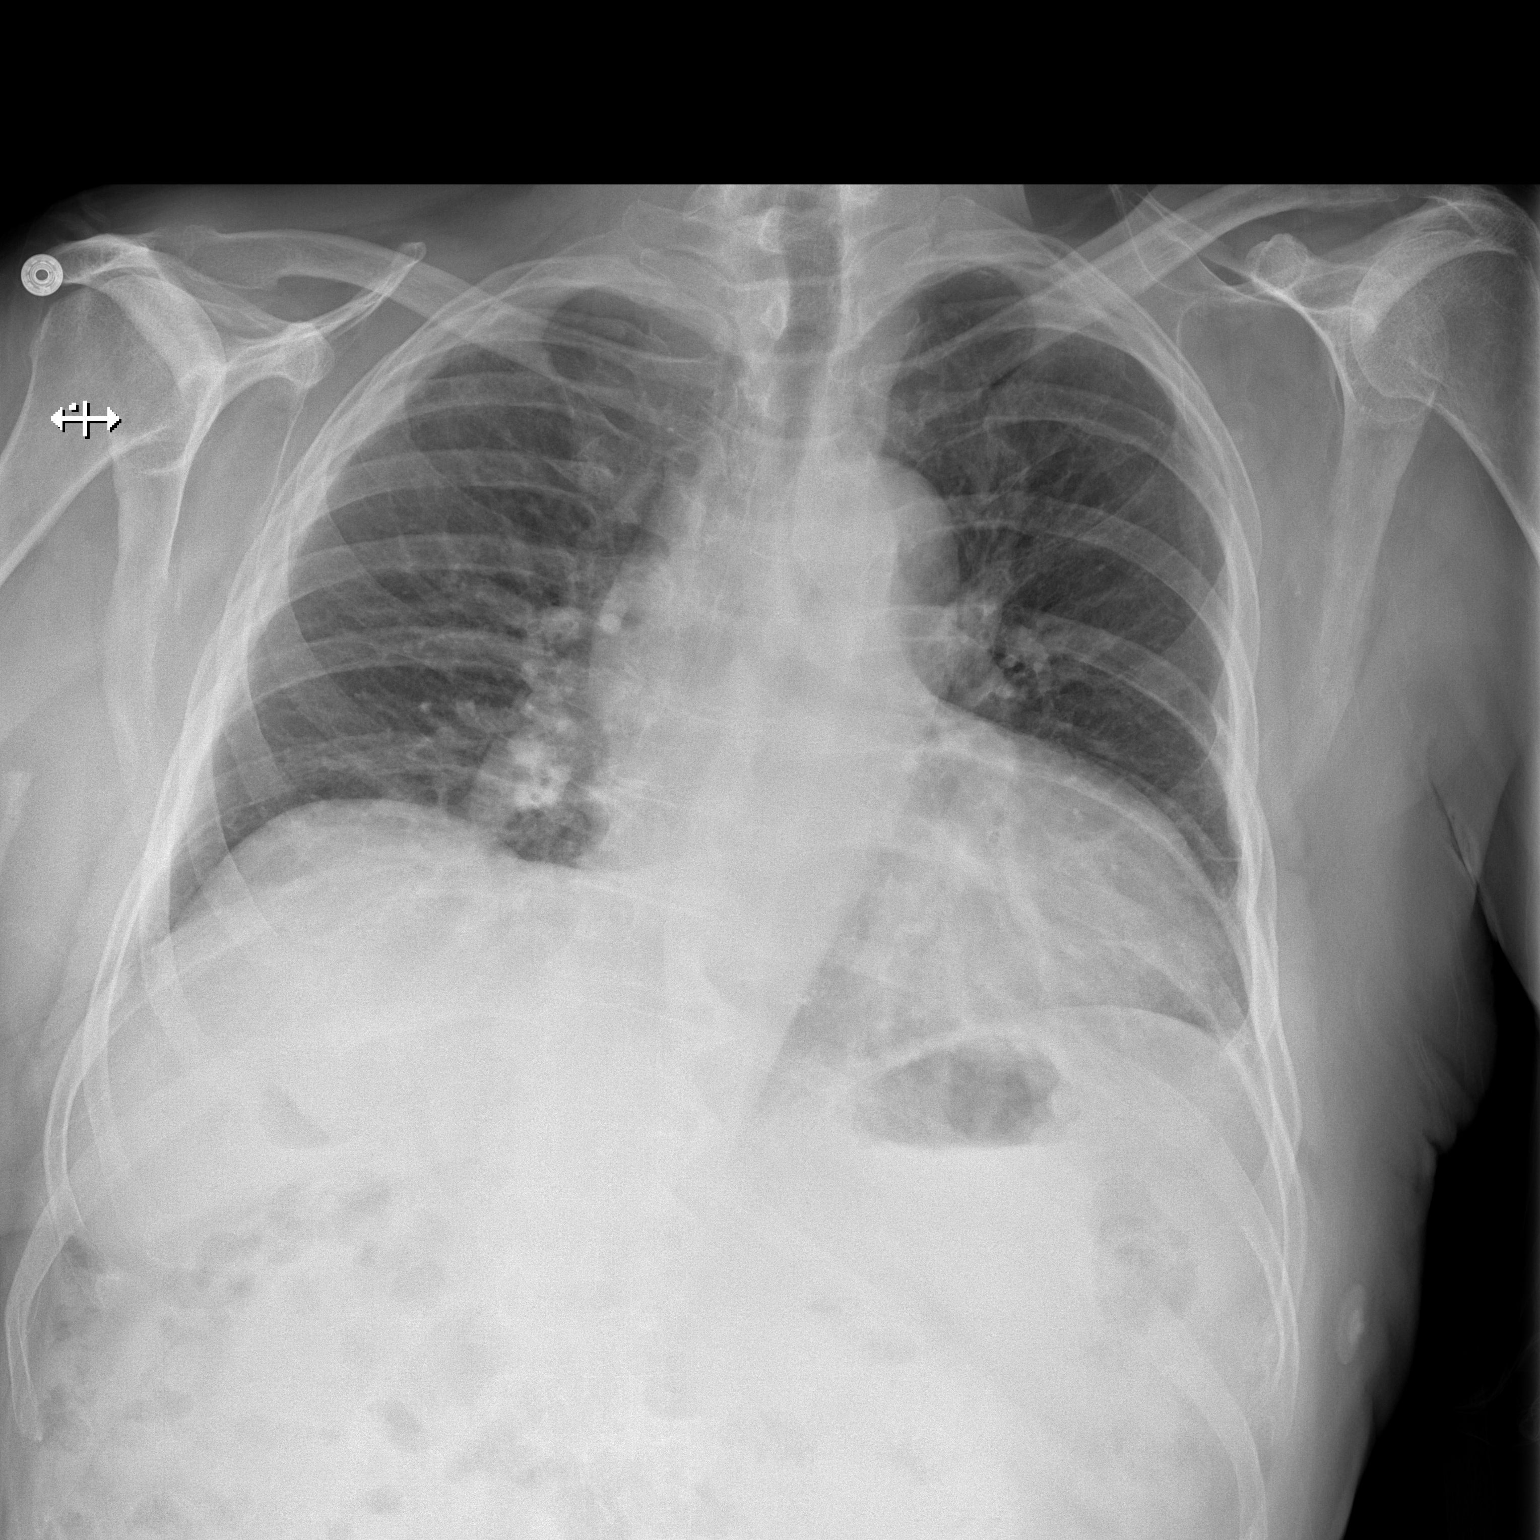

[w chest lat]
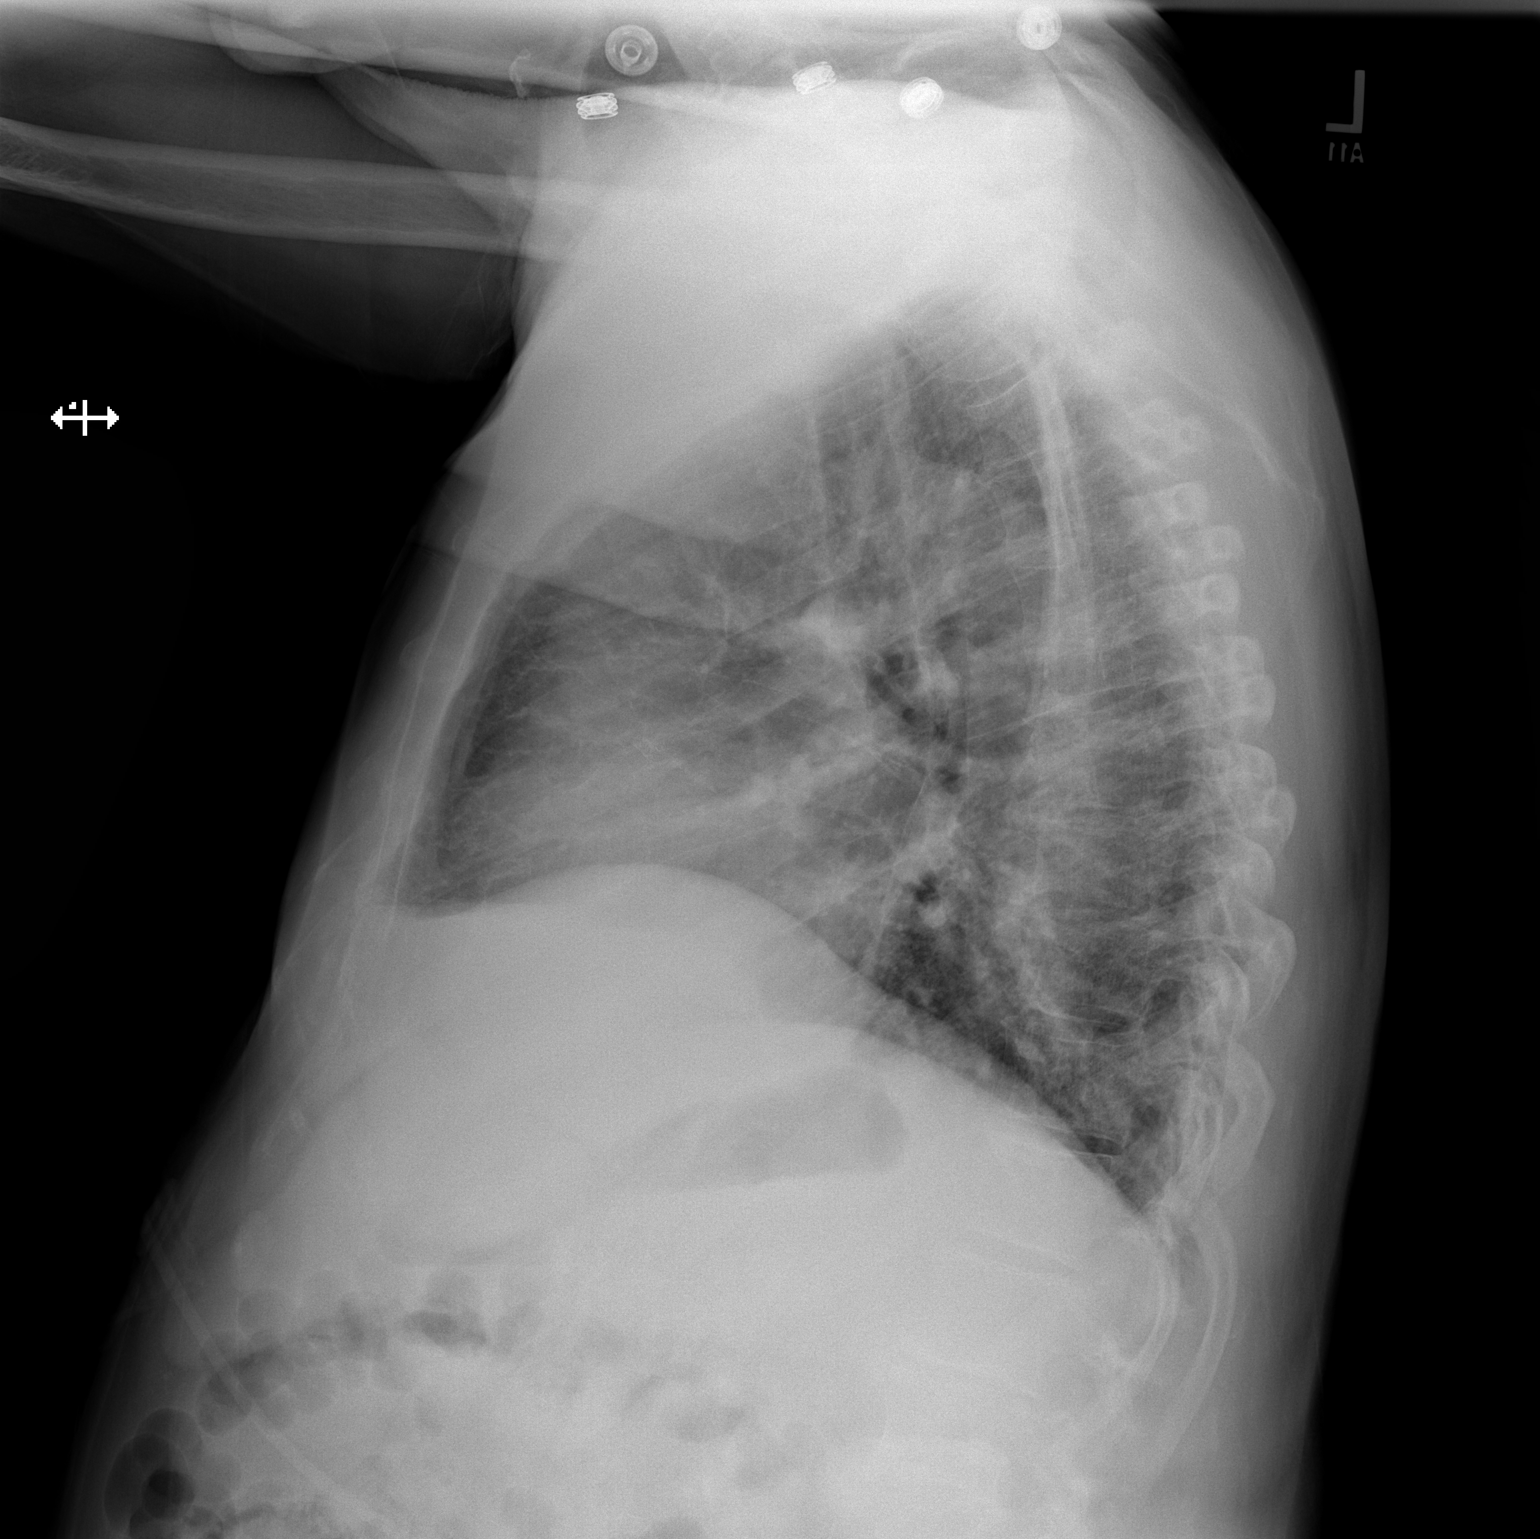

[2 of 2 positions shown; findings below may reference images not displayed]

FINDINGS: Mild elevation of the right hemidiaphragm is stable. Mild
enlargement of the cardiac silhouette is stable. The trachea is
midline. The lungs are clear without airspace disease or pulmonary
edema. No pleural effusions.
IMPRESSION: No acute cardiopulmonary disease.

Stable mild cardiomegaly.

## 2018-12-13 IMAGING — DX DG CHEST 2V
2 series · 2 of 2 positions shown · non-contrast
Comparison: March 17, 2016

CLINICAL DATA: Central chest pain.

EXAM:
CHEST  2 VIEW

[w chest pa]
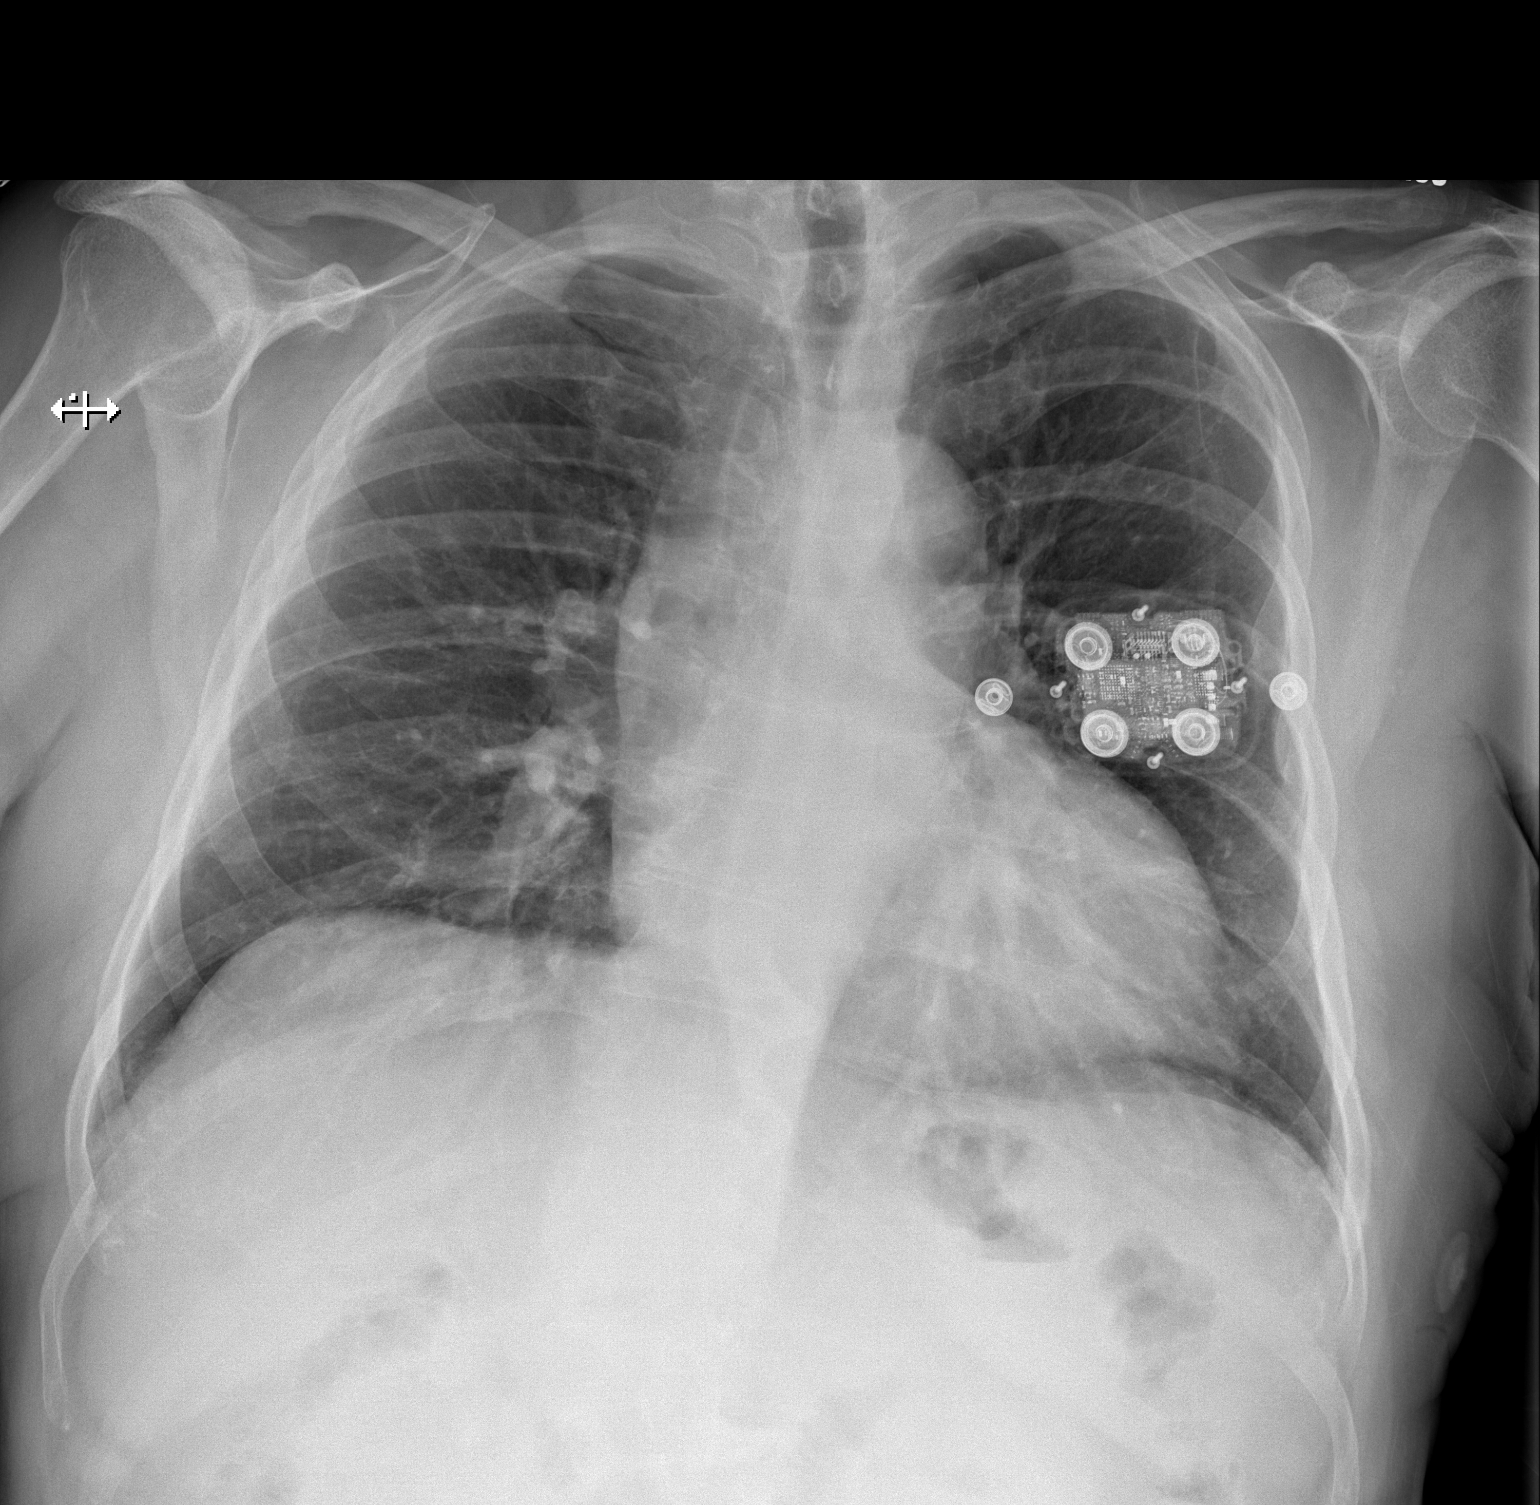

[w chest lat]
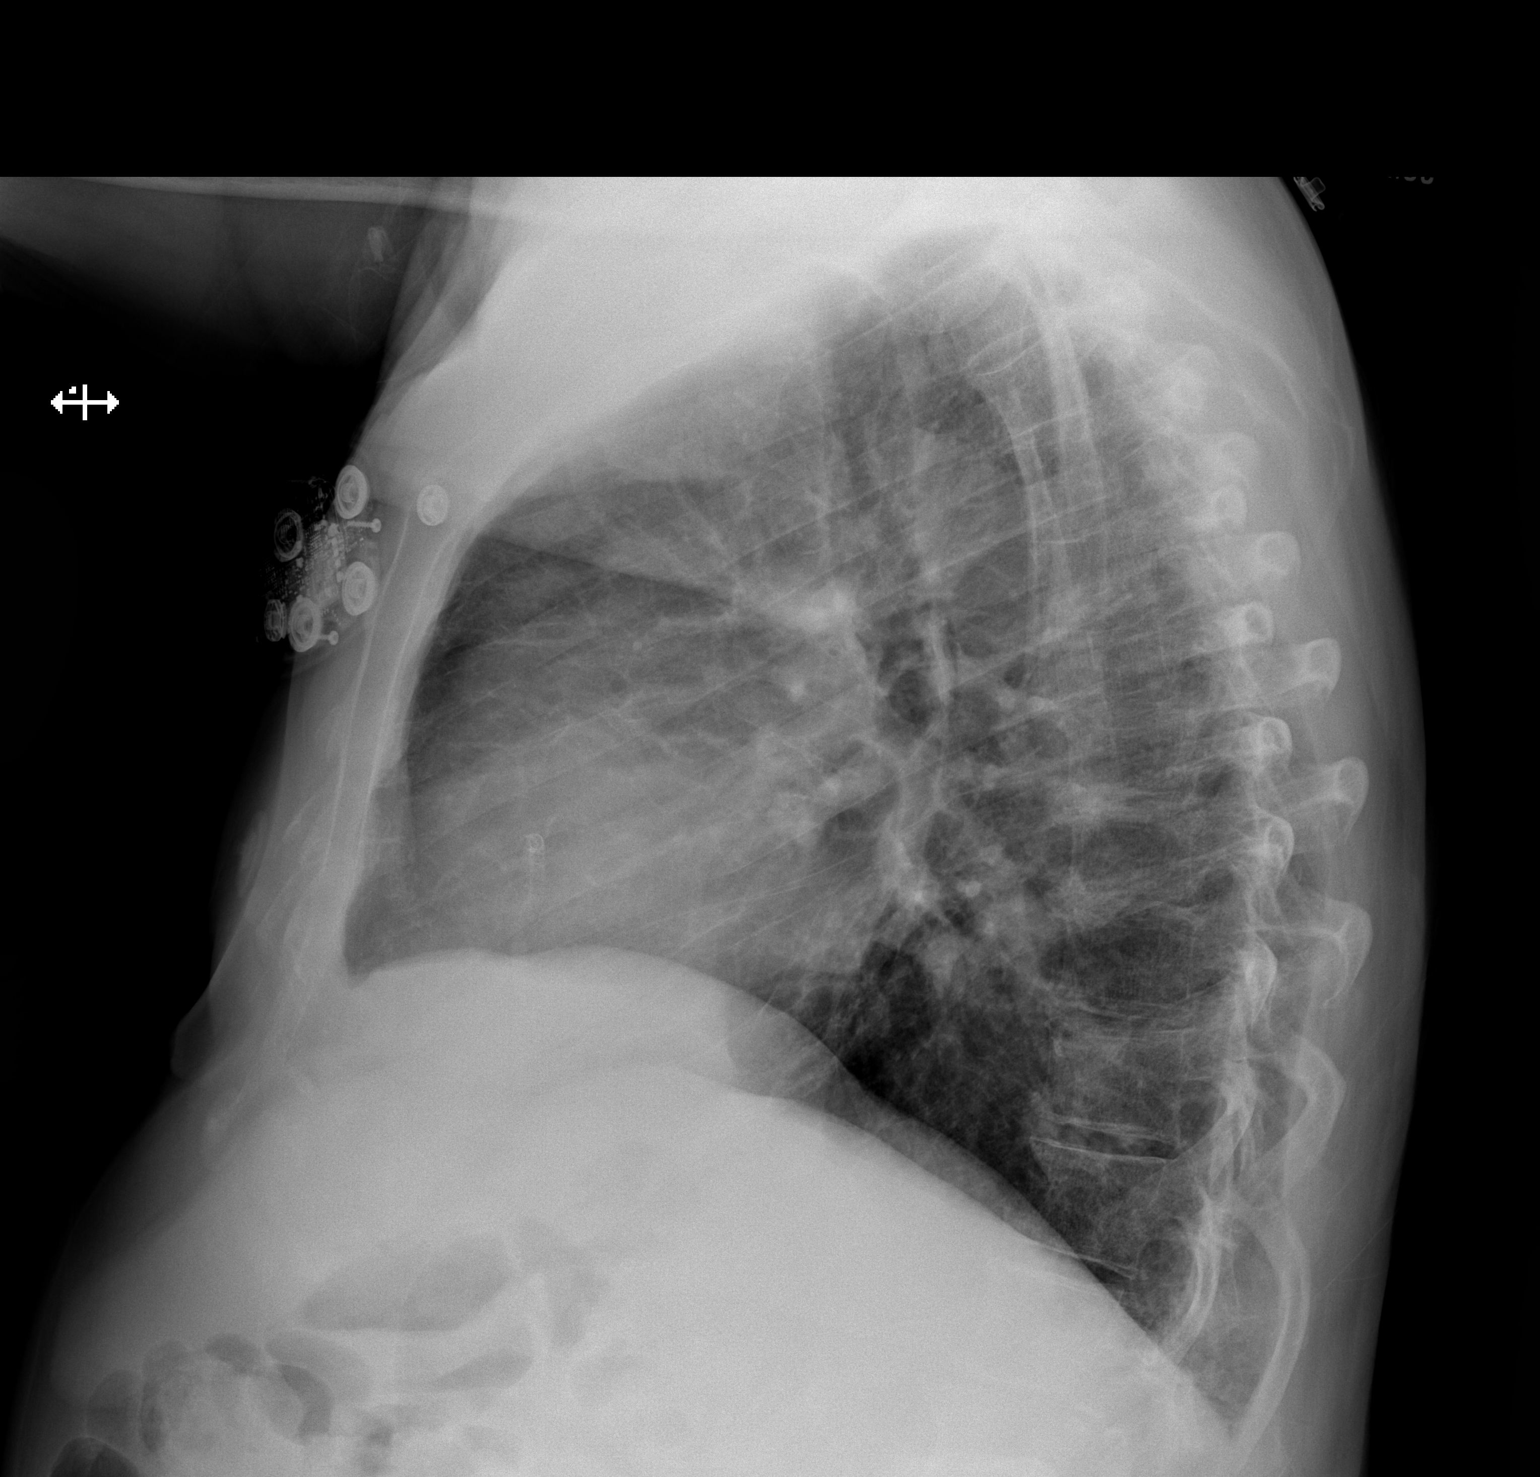

[2 of 2 positions shown; findings below may reference images not displayed]

FINDINGS: The heart size and mediastinal contours are within normal limits.
Both lungs are clear. The visualized skeletal structures are
unremarkable.
IMPRESSION: No active cardiopulmonary disease.
